# Patient Record
Sex: Male | Born: 1940 | Race: Black or African American | Hispanic: No | Marital: Married | Smoking: Never smoker
Health system: Southern US, Community
[De-identification: ages and names within clinical notes are randomized; demographics above are authoritative.]

## PROBLEM LIST (undated history)

## (undated) DIAGNOSIS — I10 Essential (primary) hypertension: Secondary | ICD-10-CM

## (undated) DIAGNOSIS — Z9989 Dependence on other enabling machines and devices: Secondary | ICD-10-CM

## (undated) DIAGNOSIS — I4891 Unspecified atrial fibrillation: Secondary | ICD-10-CM

## (undated) DIAGNOSIS — I739 Peripheral vascular disease, unspecified: Secondary | ICD-10-CM

## (undated) DIAGNOSIS — E119 Type 2 diabetes mellitus without complications: Secondary | ICD-10-CM

## (undated) DIAGNOSIS — M1990 Unspecified osteoarthritis, unspecified site: Secondary | ICD-10-CM

## (undated) DIAGNOSIS — J189 Pneumonia, unspecified organism: Secondary | ICD-10-CM

## (undated) DIAGNOSIS — N183 Chronic kidney disease, stage 3 unspecified: Secondary | ICD-10-CM

## (undated) DIAGNOSIS — E7800 Pure hypercholesterolemia, unspecified: Secondary | ICD-10-CM

## (undated) DIAGNOSIS — G4733 Obstructive sleep apnea (adult) (pediatric): Secondary | ICD-10-CM

## (undated) HISTORY — DX: Peripheral vascular disease, unspecified: I73.9

## (undated) HISTORY — PX: CARDIAC CATHETERIZATION: SHX172

## (undated) HISTORY — DX: Essential (primary) hypertension: I10

---

## 1990-06-30 HISTORY — PX: CORONARY ARTERY BYPASS GRAFT: SHX141

## 1990-07-14 DIAGNOSIS — Z951 Presence of aortocoronary bypass graft: Secondary | ICD-10-CM

## 2006-06-30 DIAGNOSIS — I739 Peripheral vascular disease, unspecified: Secondary | ICD-10-CM

## 2006-06-30 HISTORY — PX: CORONARY ANGIOPLASTY WITH STENT PLACEMENT: SHX49

## 2006-06-30 HISTORY — DX: Peripheral vascular disease, unspecified: I73.9

## 2009-07-18 ENCOUNTER — Encounter: Payer: Self-pay | Admitting: Endocrinology

## 2009-08-16 ENCOUNTER — Ambulatory Visit: Payer: Self-pay | Admitting: Endocrinology

## 2009-08-16 DIAGNOSIS — E7800 Pure hypercholesterolemia, unspecified: Secondary | ICD-10-CM | POA: Insufficient documentation

## 2009-08-16 DIAGNOSIS — I10 Essential (primary) hypertension: Secondary | ICD-10-CM | POA: Insufficient documentation

## 2009-08-16 DIAGNOSIS — E119 Type 2 diabetes mellitus without complications: Secondary | ICD-10-CM | POA: Insufficient documentation

## 2009-08-16 DIAGNOSIS — I2510 Atherosclerotic heart disease of native coronary artery without angina pectoris: Secondary | ICD-10-CM | POA: Insufficient documentation

## 2009-09-20 ENCOUNTER — Ambulatory Visit: Payer: Self-pay | Admitting: Endocrinology

## 2009-09-26 ENCOUNTER — Ambulatory Visit: Payer: Self-pay | Admitting: Endocrinology

## 2010-07-30 NOTE — Assessment & Plan Note (Signed)
Summary: follow up-lb   Vital Signs:  Patient profile:   69 year old male Height:      67 inches (170.18 cm) Weight:      186.25 pounds (84.66 kg) O2 Sat:      97 % on Room air Temp:     97.1 degrees F (36.17 degrees C) oral Pulse rate:   58 / minute BP sitting:   154 / 62  (left arm) Cuff size:   large  Vitals Entered By: Josph Macho RMA (September 26, 2009 9:31 AM)  O2 Flow:  Room air CC: Follow-up visit/ CF Is Patient Diabetic? Yes   Referring Provider:  Chrissie Noa prince Primary Provider:  Lajoyce Lauber  CC:  Follow-up visit/ CF.  History of Present Illness: pt did not take the actos, out of fear of the side-effects.  pt states he feels well in general.  he says cbg's are mid-100's  Current Medications (verified): 1)  Glimepiride 4 Mg Tabs (Glimepiride) .Marland Kitchen.. 1 Tablet Two Times A Day 2)  Folic Acid 1 Mg Tabs (Folic Acid) .Marland Kitchen.. 1 Tablet Daily 3)  Trico .... 1 Tab Daily 4)  Metoprolol Tartrate 50 Mg Tabs (Metoprolol Tartrate) .Marland Kitchen.. 1 Tablet Two Times A Day 5)  Lipitor 20 Mg Tabs (Atorvastatin Calcium) .Marland Kitchen.. 1 Tablet Daily 6)  Metformin Hcl 500 Mg Tabs (Metformin Hcl) .Marland Kitchen.. 1 Tablet Daily 7)  Multivitamin .... Once Daily 8)  Aspirin 325 Mg Tabs (Aspirin) .... Once Daily 9)  Omega 3 .... 1000mg   Daily 10)  Actos 45 Mg Tabs (Pioglitazone Hcl) .Marland Kitchen.. 1 Qd  Allergies (verified): No Known Drug Allergies  Past History:  Past Medical History: Last updated: 08/16/2009 HYPERTENSION (ICD-401.9) HYPERCHOLESTEROLEMIA (ICD-272.0) DM (ICD-250.00) CAD (ICD-414.00)  Review of Systems  The patient denies hypoglycemia.    Physical Exam  General:  normal appearance.   Extremities:  no edema   Impression & Recommendations:  Problem # 1:  DM (ICD-250.00) needs increased rx  Medications Added to Medication List This Visit: 1)  Metformin Hcl 500 Mg Xr24h-tab (Metformin hcl) .Marland Kitchen.. 1 two times a day 2)  Bromocriptine Mesylate 2.5 Mg Tabs (Bromocriptine mesylate) .... 1/2 at  bedtime 3)  Glimepiride 2 Mg Tabs (Glimepiride) .Marland Kitchen.. 1 two times a day  Other Orders: Est. Patient Level III (16109)  Patient Instructions: 1)  please let me know if you decide to take the actos (it can reduce the need for the tricor). 2)  increase metformin to 500 mg two times a day 3)  add bromocriptine 1/2 of 2.5 mg each night 4)  Please schedule a follow-up appointment in 1 month.

## 2010-07-30 NOTE — Assessment & Plan Note (Signed)
Summary: NEW ENDO / DIABETES / [facility] / #/ CD   Vital Signs:  Patient profile:   70 year old male Height:      67 inches (170.18 cm) Weight:      184 pounds (83.64 kg) BMI:     28.92 O2 Sat:      98 % on Room air Temp:     96.4 degrees F (35.78 degrees C) oral Pulse rate:   50 / minute BP sitting:   158 / 78  (left arm) Cuff size:   large  Vitals Entered By: Josph Macho RMA (August 16, 2009 1:39 PM)  O2 Flow:  Room air CC: New Endo: Diabetes/ CF Is Patient Diabetic? Yes   Referring Provider:  Chrissie Noa prince Primary Provider:  Lajoyce Lauber  CC:  New Endo: Diabetes/ CF.  History of Present Illness: pt states 3 years h/o dm.  it is complicated by cad.  he has never been on insulin.  he takes amaryl and metformin.  no cbg record, but states cbg's are mid-100's. pt says his diet and exercise are "pretty good."   symptomatically, pt states few mos of moderate burning type pain in the legs, but no associated numbness.   Current Medications (verified): 1)  Glimepiride 4 Mg Tabs (Glimepiride) .Marland Kitchen.. 1 Tablet Two Times A Day 2)  Folic Acid 1 Mg Tabs (Folic Acid) .Marland Kitchen.. 1 Tablet Daily 3)  Trico .... 1 Tab Daily 4)  Metoprolol Tartrate 50 Mg Tabs (Metoprolol Tartrate) .Marland Kitchen.. 1 Tablet Two Times A Day 5)  Lipitor 20 Mg Tabs (Atorvastatin Calcium) .Marland Kitchen.. 1 Tablet Daily 6)  Metformin Hcl 500 Mg Tabs (Metformin Hcl) .Marland Kitchen.. 1 Tablet Daily 7)  Multivitamin .... Once Daily 8)  Aspirin 325 Mg Tabs (Aspirin) .... Once Daily 9)  Omega 3 .... 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  he reports dry mouth and polyuria.  he has intemittent night sweats.   Physical Exam  General:  normal appearance.   Head:  head: no deformity eyes: no periorbital swelling, no proptosis external nose and ears are normal mouth: no lesion seen Neck:  Supple without thyroid enlargement or tenderness.   Chest Wall:  there is a median sternotomy scar Lungs:  Clear to auscultation bilaterally. Normal respiratory effort.  Heart:  Regular rate and rhythm without murmurs or gallops noted. Normal S1,S2.   Msk:  muscle bulk and strength are grossly normal.  no obvious joint swelling.  gait is normal and steady  Pulses:  dorsalis pedis intact bilat.  no carotid bruit  Extremities:  no deformity.  no ulcer on the feet.  feet are of normal color and temp.  no edema there are bilateral vein harvest scars Neurologic:  cn 2-12 grossly intact.   readily moves all 4's.   sensation is intact to touch on the feet  Skin:  normal texture and temp.  no rash.  not diaphoretic  Cervical Nodes:  No significant adenopathy.  Psych:  Alert and cooperative; normal mood and affect; normal attention span and concentration.   Additional Exam:  test results are reviewed:  a1c=7.8  Impression & Recommendations:  Problem # 1:  DM (ICD-250.00) needs increased rx  Problem # 2:  CAD (ICD-414.00) so she needs to avoid hypoglycemia  Problem # 3:  leg pain prob neuropathic  Medications Added to Medication List This Visit: 1)  Glimepiride 4 Mg Tabs (Glimepiride) .Marland Kitchen.. 1 tablet two times a day 2)  Folic Acid 1 Mg Tabs (Folic acid) .Marland Kitchen.. 1 tablet daily 3)  Trico  .... 1 tab daily 4)  Metoprolol Tartrate 50 Mg Tabs (Metoprolol tartrate) .Marland Kitchen.. 1 tablet two times a day 5)  Lipitor 20 Mg Tabs (Atorvastatin calcium) .Marland Kitchen.. 1 tablet daily 6)  Metformin Hcl 500 Mg Tabs (Metformin hcl) .Marland Kitchen.. 1 tablet daily 7)  Multivitamin  .... Once daily 8)  Aspirin 325 Mg Tabs (Aspirin)  .... Once daily 9)  Omega 3  .... 1000mg   daily 10)  Actos 45 Mg Tabs (Pioglitazone hcl) .Marland Kitchen.. 1 qd  Other Orders: Prescription Created Electronically [id number]) Consultation Level IV [phone])  Patient Instructions: 1)  we discussed the importance of diet and exercise therapy and the risks of diabetes.  you should see an eye doctor every year. 2)  i told pt we will need to take this complex situation in stages 3)  it is very important to keep good control of blood pressure and cholesterol, especially in those with diabetes.  stopping smoking also reduces the damage diabetes does to your body.  please discuss these with your doctor.  you should take an aspirin every day, unless you have been advised by a doctor not to. 4)  check your blood sugar 1 time a day.  vary the time of day when you check, between before the 3 meals, and at bedtime.  also check if you have symptoms of your blood sugar being too high or too low.  please keep a record of the readings and bring it to your next appointment here.  please call us sooner if you are having low blood sugar episodes. 5)  add actos 45 mg once daily.  please note that this is a very slow-acting medication. 6)  as the actos works, you will notice your blood sugar decreasing.  as it does, reduce your glimepiride.  if i need to call a smaller pill size (1 or 2 mg) to your pharmacy, call me.  7)  also call sooner if you notice swelling of the legs 8)  Please schedule a follow-up appointment in 1 month. Prescriptions: ACTOS 45 MG TABS (PIOGLITAZONE HCL) 1 qd  #90 x 3   Entered and Authorized by:   Minus Breeding MD   Signed by:   Minus Breeding MD on 08/16/2009   Method used:   Electronically to        [facility]* (mail-order)             ,          Ph: [phone]       Fax: [phone]   RxID:   [id number]   Preventive Care Screening  Last Flu Shot:    Date:  03/30/2009    Results:  historical

## 2010-12-02 ENCOUNTER — Other Ambulatory Visit: Payer: Self-pay | Admitting: Endocrinology

## 2014-11-15 ENCOUNTER — Telehealth: Payer: Self-pay | Admitting: Hematology

## 2014-11-15 NOTE — Telephone Encounter (Signed)
new patient referral-called patient to give appt per patient call at funeral. Will call back later on today.

## 2014-11-15 NOTE — Telephone Encounter (Signed)
new patient referral-left message for patient to return call

## 2014-11-16 ENCOUNTER — Telehealth: Payer: Self-pay | Admitting: Hematology

## 2014-11-16 NOTE — Telephone Encounter (Signed)
new patient appt-s/w patient and gve np appt for 06/07 @ 10:30 w/Dr. Burr Medico Referring Dr. Elmarie Shiley Dx-Abnl SPEP; Abnl light chain

## 2014-12-05 ENCOUNTER — Ambulatory Visit (HOSPITAL_BASED_OUTPATIENT_CLINIC_OR_DEPARTMENT_OTHER): Payer: Medicare Other | Admitting: Hematology

## 2014-12-05 ENCOUNTER — Ambulatory Visit: Payer: Medicare Other

## 2014-12-05 ENCOUNTER — Encounter (INDEPENDENT_AMBULATORY_CARE_PROVIDER_SITE_OTHER): Payer: Self-pay

## 2014-12-05 ENCOUNTER — Telehealth: Payer: Self-pay | Admitting: Hematology

## 2014-12-05 ENCOUNTER — Encounter: Payer: Self-pay | Admitting: Hematology

## 2014-12-05 ENCOUNTER — Other Ambulatory Visit (HOSPITAL_BASED_OUTPATIENT_CLINIC_OR_DEPARTMENT_OTHER): Payer: Medicare Other

## 2014-12-05 VITALS — BP 149/66 | HR 45 | Temp 97.7°F | Resp 18 | Ht 67.0 in | Wt 182.5 lb

## 2014-12-05 DIAGNOSIS — N189 Chronic kidney disease, unspecified: Secondary | ICD-10-CM | POA: Diagnosis not present

## 2014-12-05 DIAGNOSIS — D472 Monoclonal gammopathy: Secondary | ICD-10-CM

## 2014-12-05 DIAGNOSIS — E119 Type 2 diabetes mellitus without complications: Secondary | ICD-10-CM

## 2014-12-05 LAB — CBC & DIFF AND RETIC
BASO%: 0.3 % (ref 0.0–2.0)
Basophils Absolute: 0 10*3/uL (ref 0.0–0.1)
EOS ABS: 0.1 10*3/uL (ref 0.0–0.5)
EOS%: 1.3 % (ref 0.0–7.0)
HCT: 42.7 % (ref 38.4–49.9)
HGB: 14.7 g/dL (ref 13.0–17.1)
IMMATURE RETIC FRACT: 7.1 % (ref 3.00–10.60)
LYMPH%: 32.9 % (ref 14.0–49.0)
MCH: 31.2 pg (ref 27.2–33.4)
MCHC: 34.4 g/dL (ref 32.0–36.0)
MCV: 90.7 fL (ref 79.3–98.0)
MONO#: 0.6 10*3/uL (ref 0.1–0.9)
MONO%: 8 % (ref 0.0–14.0)
NEUT%: 57.5 % (ref 39.0–75.0)
NEUTROS ABS: 4.1 10*3/uL (ref 1.5–6.5)
Platelets: 165 10*3/uL (ref 140–400)
RBC: 4.71 10*6/uL (ref 4.20–5.82)
RDW: 13.4 % (ref 11.0–14.6)
RETIC %: 1.32 % (ref 0.80–1.80)
Retic Ct Abs: 62.17 10*3/uL (ref 34.80–93.90)
WBC: 7.1 10*3/uL (ref 4.0–10.3)
lymph#: 2.3 10*3/uL (ref 0.9–3.3)

## 2014-12-05 LAB — COMPREHENSIVE METABOLIC PANEL (CC13)
ALT: 36 U/L (ref 0–55)
AST: 26 U/L (ref 5–34)
Albumin: 3.8 g/dL (ref 3.5–5.0)
Alkaline Phosphatase: 64 U/L (ref 40–150)
Anion Gap: 7 mEq/L (ref 3–11)
BILIRUBIN TOTAL: 0.62 mg/dL (ref 0.20–1.20)
BUN: 23 mg/dL (ref 7.0–26.0)
CALCIUM: 9.4 mg/dL (ref 8.4–10.4)
CO2: 24 mEq/L (ref 22–29)
CREATININE: 1.2 mg/dL (ref 0.7–1.3)
Chloride: 106 mEq/L (ref 98–109)
EGFR: 68 mL/min/{1.73_m2} — AB (ref >90)
Glucose: 162 mg/dl — ABNORMAL HIGH (ref 70–140)
Potassium: 4 mEq/L (ref 3.5–5.1)
Sodium: 137 mEq/L (ref 136–145)
TOTAL PROTEIN: 7.6 g/dL (ref 6.4–8.3)

## 2014-12-05 LAB — LACTATE DEHYDROGENASE (CC13): LDH: 219 U/L (ref 125–245)

## 2014-12-05 NOTE — Progress Notes (Signed)
Checked in new pt with no financial concerns prior to seeing the dr.  Pt has 2 insurances so financial assistance may not be needed but he has my card for any questions or concerns.  ° °

## 2014-12-05 NOTE — Telephone Encounter (Signed)
Gave and printed appt sched and avs for pt for JUNE °

## 2014-12-05 NOTE — Progress Notes (Signed)
[facility]  [phone] Fax:([phone]) [place] Note   Patient Care Team: Bonita Quin, MD as PCP - General (Internal Medicine) Royann Shivers, MD as Referring Physician (Nephrology) Truitt Merle, MD as Consulting Physician (Hematology) Foye Spurling, MD as Consulting Physician (Internal Medicine) 12/05/2014  CHIEF COMPLAINTS/PURPOSE OF CONSULTATION:  Abnormal SPEP   Referring physician:  Dr. Elmarie Shiley  HISTORY OF PRESENTING ILLNESS:  Dustin [name] 74 y.o. male is here because of abnormal SPEP.  He has long-standing history of diabetes, and chronic kidney disease. He was recently referred to a nephrologist Dr. Posey Pronto on 11/01/2014. As part of her workup, SPEP and light chain level was checked. 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 Serum free coprolite chain was elevated at 39, lambda light chain was normal, ratio was 2.62.  He feels well, no pain, except sometime left hip pain when he walks, he uses a crane, no other pain. He has good appetite and energy, he is not very active due to the left pauiin.   MEDICAL HISTORY:  Past Medical History  Diagnosis Date  . Hypertension   . Chronic kidney disease   . Diabetes mellitus without complication   . PVD (peripheral vascular disease) 2008    stent placement in legs     SURGICAL HISTORY: Past Surgical History  Procedure Laterality Date  . Coronary artery bypass graft  1992  . Coronary angioplasty with stent placement  2008    SOCIAL HISTORY: History   Social History  . Marital Status: Married    Spouse Name: N/A  . Number of Children: 3  . Years of Education: N/A   Occupational History  . Worked for [employer]   Social History Main Topics  . Smoking status: Never Smoker   . Smokeless tobacco: Not on file  . Alcohol Use: No  . Drug Use: No  . Sexual Activity: Not on file   Other Topics Concern  . Not on file   Social History  Narrative  . No narrative on file    FAMILY HISTORY: Family History  Problem Relation Age of Onset  . Heart failure Mother   . Heart failure Father   . Diabetes Sister   . Congenital heart disease Sister   . Cancer Brother 8    prostate and colon cancer     ALLERGIES:  has No Known Allergies.  MEDICATIONS:  Current Outpatient Prescriptions  Medication Sig Dispense Refill  . atorvastatin (LIPITOR) 20 MG tablet Take 20 mg by mouth every evening.    . canagliflozin (INVOKANA) 300 MG TABS tablet Take 300 mg by mouth daily before breakfast.    . cholecalciferol (VITAMIN D) 400 UNITS TABS tablet Take 5,000 Units by mouth daily.    Marland Kitchen co-enzyme Q-10 30 MG capsule Take 100 mg by mouth daily.    Marland Kitchen glimepiride (AMARYL) 2 MG tablet Take 2 mg by mouth 2 (two) times daily.    . Levomefolic Acid 1 MG CAPS Take 5,000 mcg by mouth daily.    Marland Kitchen linagliptin (TRADJENTA) 5 MG TABS tablet Take 5 mg by mouth daily.    Marland Kitchen lisinopril (PRINIVIL,ZESTRIL) 20 MG tablet Take 20 mg by mouth 2 (two) times daily.    .   magnesium 30 MG tablet Take 30 mg by mouth 2 (two) times daily.    . metoprolol (LOPRESSOR) 50 MG tablet Take 50 mg by mouth 2 (two) times daily.    Marland Kitchen NON  FORMULARY Take 12.5 mg by mouth daily. Iodine complex, take 1 tab daily by mouth    . rivaroxaban (XARELTO) 20 MG TABS tablet Take 20 mg by mouth daily with supper.    . saw palmetto 160 MG capsule Take 160 mg by mouth 2 (two) times daily.    . tamsulosin (FLOMAX) 0.4 MG CAPS capsule Take 0.4 mg by mouth daily. 2 capsules once daily    . timolol (TIMOPTIC) 0.5 % ophthalmic solution Place 1 drop into both eyes daily.    . Travoprost, BAK Free, (TRAVATAN) 0.004 % SOLN ophthalmic solution Place 1 drop into both eyes daily.     No current facility-administered medications for this visit.    REVIEW OF SYSTEMS:   Constitutional: Denies fevers, chills or abnormal night sweats Eyes: Denies blurriness of vision, double vision or watery eyes Ears,  nose, mouth, throat, and face: Denies mucositis or sore throat Respiratory: Denies cough, dyspnea or wheezes Cardiovascular: Denies palpitation, chest discomfort or lower extremity swelling Gastrointestinal:  Denies nausea, heartburn or change in bowel habits Skin: Denies abnormal skin rashes Lymphatics: Denies new lymphadenopathy or easy bruising Neurological:Denies numbness, tingling or new weaknesses Behavioral/Psych: Mood is stable, no new changes  All other systems were reviewed with the patient and are negative.  PHYSICAL EXAMINATION: ECOG PERFORMANCE STATUS: 0 - Asymptomatic  Filed Vitals:   12/05/14 1029  BP: 149/66  Pulse: 45  Temp: 97.7 F (36.5 C)  Resp: 18   Filed Weights   12/05/14 1029  Weight: 182 lb 8 oz (82.781 kg)    GENERAL:alert, no distress and comfortable SKIN: skin color, texture, turgor are normal, no rashes or significant lesions EYES: normal, conjunctiva are pink and non-injected, sclera clear OROPHARYNX:no exudate, no erythema and lips, buccal mucosa, and tongue normal  NECK: supple, thyroid normal size, non-tender, without nodularity LYMPH:  no palpable lymphadenopathy in the cervical, axillary or inguinal LUNGS: clear to auscultation and percussion with normal breathing effort HEART: regular rate & rhythm and no murmurs and no lower extremity edema ABDOMEN:abdomen soft, non-tender and normal bowel sounds Musculoskeletal:no cyanosis of digits and no clubbing  PSYCH: alert & oriented x 3 with fluent speech NEURO: no focal motor/sensory deficits  LABORATORY DATA:  I have reviewed the data as listed No results found for: WBC, HGB, HCT, MCV, PLT No results for input(s): NA, K, CL, CO2, GLUCOSE, BUN, CREATININE, CALCIUM, GFRNONAA, GFRAA, PROT, ALBUMIN, AST, ALT, ALKPHOS, BILITOT, BILIDIR, IBILI in the last 8760 hours.  RADIOGRAPHIC STUDIES: I have personally reviewed the radiological images as listed and agreed with the findings in the report. No  results found.  ASSESSMENT & PLAN:  74 year old male with past medical history of diabetes and chronic kidney disease  1. 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 He is asymptomatic except chronic left hip pain from arthritis. -I reviewed his outside lab. SPEP showed monoclonal IgG gammopathy, with low M protein level (0.5g/dl), serum free Kappa light chain is slightly elevated, light chain ratio was normal. -His outside CBC was normal, CMP showed a normal calcium level, creatinine 1.44. He does have long-standing history of diabetes, which can cause chronic kidney disease. -I'll like to obtain a bone survey and a bone marrow biopsy to ruled out multiple myeloma. He agrees. Burnis Medin obtain his 24-hour urine UPEP with immunofixation and light chain level, will also check LDH and beta-2 microglobulin. -I'll see him back in 3 weeks to discuss the above test results.  2. DM, CKD -He  will continue follow-up with his primary care physician and nephrologist.  All questions were answered. The patient knows to call the clinic with any problems, questions or concerns. I spent 40 minutes counseling the patient face to face. The total time spent in the appointment was 50 minutes and more than 50% was on counseling.     Truitt Merle, MD 12/05/2014 11:39 AM

## 2014-12-07 LAB — SPEP & IFE WITH QIG
ALPHA-1-GLOBULIN: 0.2 g/dL (ref 0.2–0.3)
ALPHA-2-GLOBULIN: 0.6 g/dL (ref 0.5–0.9)
Abnormal Protein Band1: 0.6 g/dL
Albumin ELP: 4.1 g/dL (ref 3.8–4.8)
Beta 2: 0.4 g/dL (ref 0.2–0.5)
Beta Globulin: 0.5 g/dL (ref 0.4–0.6)
GAMMA GLOBULIN: 1.6 g/dL (ref 0.8–1.7)
IGA: 302 mg/dL (ref 68–379)
IGG (IMMUNOGLOBIN G), SERUM: 1880 mg/dL — AB (ref 650–1600)
IgM, Serum: 46 mg/dL (ref 41–251)
Total Protein, Serum Electrophoresis: 7.3 g/dL (ref 6.1–8.1)

## 2014-12-07 LAB — KAPPA/LAMBDA LIGHT CHAINS
Kappa free light chain: 2.76 mg/dL — ABNORMAL HIGH (ref 0.33–1.94)
Kappa:Lambda Ratio: 1.77 — ABNORMAL HIGH (ref 0.26–1.65)
LAMBDA FREE LGHT CHN: 1.56 mg/dL (ref 0.57–2.63)

## 2014-12-07 LAB — BETA 2 MICROGLOBULIN, SERUM: Beta-2 Microglobulin: 1.78 mg/L (ref <=2.51)

## 2014-12-09 ENCOUNTER — Encounter: Payer: Self-pay | Admitting: Hematology

## 2014-12-12 ENCOUNTER — Ambulatory Visit (HOSPITAL_COMMUNITY)
Admission: RE | Admit: 2014-12-12 | Discharge: 2014-12-12 | Disposition: A | Discharge status: Home / Self Care | Payer: Medicare Other | Source: Ambulatory Visit | Attending: Hematology | Admitting: Hematology

## 2014-12-12 DIAGNOSIS — R930 Abnormal findings on diagnostic imaging of skull and head, not elsewhere classified: Secondary | ICD-10-CM | POA: Diagnosis not present

## 2014-12-12 DIAGNOSIS — D472 Monoclonal gammopathy: Secondary | ICD-10-CM | POA: Diagnosis not present

## 2014-12-12 DIAGNOSIS — Z951 Presence of aortocoronary bypass graft: Secondary | ICD-10-CM | POA: Insufficient documentation

## 2014-12-12 DIAGNOSIS — M479 Spondylosis, unspecified: Secondary | ICD-10-CM | POA: Insufficient documentation

## 2014-12-12 LAB — UPEP/TP, 24-HR URINE
Collection Interval: 24 hours
Total Volume, Urine: 2000 mL

## 2014-12-12 LAB — 24 HR URINE,KAPPA/LAMBDA LIGHT CHAINS
Measured Kappa Chain: <0.4 mg/dL (ref <2.00)
Measured Lambda Chain: <0.4 mg/dL (ref <2.00)
URINE VOLUME: 2000 mL/(24.h)

## 2014-12-12 LAB — UIFE/LIGHT CHAINS/TP QN, 24-HR UR
Albumin, U: DETECTED
Alpha 1, Urine: DETECTED — AB
Alpha 2, Urine: DETECTED — AB
Beta, Urine: DETECTED — AB
GAMMA UR: DETECTED — AB
TIME-UPE24: 24 h
Volume, Urine: 2000 mL

## 2014-12-18 ENCOUNTER — Other Ambulatory Visit: Payer: Self-pay | Admitting: Radiology

## 2014-12-19 ENCOUNTER — Ambulatory Visit (HOSPITAL_COMMUNITY)
Admission: RE | Admit: 2014-12-19 | Discharge: 2014-12-19 | Disposition: A | Discharge status: Home / Self Care | Payer: Medicare Other | Source: Ambulatory Visit | Attending: Hematology | Admitting: Hematology

## 2014-12-19 ENCOUNTER — Encounter (HOSPITAL_COMMUNITY): Payer: Self-pay

## 2014-12-19 DIAGNOSIS — E119 Type 2 diabetes mellitus without complications: Secondary | ICD-10-CM | POA: Diagnosis not present

## 2014-12-19 DIAGNOSIS — D472 Monoclonal gammopathy: Secondary | ICD-10-CM | POA: Insufficient documentation

## 2014-12-19 LAB — CBC
HEMATOCRIT: 44.2 % (ref 39.0–52.0)
HEMOGLOBIN: 14.9 g/dL (ref 13.0–17.0)
MCH: 30.9 pg (ref 26.0–34.0)
MCHC: 33.7 g/dL (ref 30.0–36.0)
MCV: 91.7 fL (ref 78.0–100.0)
Platelets: 182 10*3/uL (ref 150–400)
RBC: 4.82 MIL/uL (ref 4.22–5.81)
RDW: 13.3 % (ref 11.5–15.5)
WBC: 7.4 10*3/uL (ref 4.0–10.5)

## 2014-12-19 LAB — PROTIME-INR
INR: 1.09 (ref 0.00–1.49)
Prothrombin Time: 14.3 seconds (ref 11.6–15.2)

## 2014-12-19 LAB — GLUCOSE, CAPILLARY: Glucose-Capillary: 138 mg/dL — ABNORMAL HIGH (ref 65–99)

## 2014-12-19 LAB — BONE MARROW EXAM

## 2014-12-19 LAB — APTT: APTT: 23 s — AB (ref 24–37)

## 2014-12-19 MED ORDER — FENTANYL CITRATE (PF) 100 MCG/2ML IJ SOLN
INTRAMUSCULAR | Status: AC | PRN
  Administered 2014-12-19: 25 ug via INTRAVENOUS

## 2014-12-19 MED ORDER — MIDAZOLAM HCL 2 MG/2ML IJ SOLN
INTRAMUSCULAR | Status: AC
  Filled 2014-12-19: qty 4

## 2014-12-19 MED ORDER — MIDAZOLAM HCL 2 MG/2ML IJ SOLN
INTRAMUSCULAR | Status: AC | PRN
  Administered 2014-12-19: 1 mg via INTRAVENOUS

## 2014-12-19 MED ORDER — FENTANYL CITRATE (PF) 100 MCG/2ML IJ SOLN
INTRAMUSCULAR | Status: AC
  Filled 2014-12-19: qty 2

## 2014-12-19 MED ORDER — SODIUM CHLORIDE 0.9 % IV SOLN
Freq: Once | INTRAVENOUS | Status: AC
  Administered 2014-12-19: 10:00:00 via INTRAVENOUS

## 2014-12-19 NOTE — H&P (Signed)
Chief Complaint: "I'm getting a bone marrow biopsy"  Referring Physician(s): Feng,Yan  History of Present Illness: Dustin [name] is a 74 y.o. male with history of HTN, PVD, diabetes, hyperthyroidism, CAD with prior stenting, chronic kidney disease, atrial fibrillation (on xarelto) and  IgG MGUS who presents today for CT-guided bone marrow biopsy for further evaluation.  Past Medical History  Diagnosis Date  . Hypertension   . Chronic kidney disease   . Diabetes mellitus without complication   . PVD (peripheral vascular disease) 2008    stent placement in legs     Past Surgical History  Procedure Laterality Date  . Coronary artery bypass graft  1992  . Coronary angioplasty with stent placement  2008    Allergies: Review of patient's allergies indicates no known allergies.  Medications: Prior to Admission medications   Medication Sig Start Date End Date Taking? Authorizing Provider  atorvastatin (LIPITOR) 40 MG tablet Take 20 mg by mouth daily.   Yes Historical Provider, MD  Calcium Citrate 150 MG CAPS Take 300 mg by mouth daily.   Yes Historical Provider, MD  canagliflozin (INVOKANA) 300 MG TABS tablet Take 300 mg by mouth daily.    Yes Historical Provider, MD  Cholecalciferol (VITAMIN D3) 5000 UNITS CAPS Take 5,000 Units by mouth daily.   Yes Historical Provider, MD  Cyanocobalamin (B-12) 5000 MCG CAPS Take 1 capsule by mouth daily.   Yes Historical Provider, MD  glimepiride (AMARYL) 2 MG tablet Take 4 mg by mouth 2 (two) times daily.    Yes Historical Provider, MD  linagliptin (TRADJENTA) 5 MG TABS tablet Take 5 mg by mouth daily.   Yes Historical Provider, MD  lisinopril (PRINIVIL,ZESTRIL) 20 MG tablet Take 20 mg by mouth 2 (two) times daily.   Yes Historical Provider, MD  Magnesium 250 MG TABS Take 2 tablets by mouth daily.   Yes Historical Provider, MD  metoprolol (LOPRESSOR) 50 MG tablet Take 50 mg by mouth 2 (two) times daily.   Yes Historical Provider, MD  NON  FORMULARY Take 12.5 mg by mouth daily. Iodine complex   Yes Historical Provider, MD  NON FORMULARY Take 5 mg by mouth daily. dehydroepiandrosterone   Yes Historical Provider, MD  NON FORMULARY Take 5,000 mcg by mouth daily. Folate   Yes Historical Provider, MD  pyridOXINE (VITAMIN B-6) 50 MG tablet Take 50 mg by mouth daily.   Yes Historical Provider, MD  rivaroxaban (XARELTO) 20 MG TABS tablet Take 20 mg by mouth daily with supper.   Yes Historical Provider, MD  Saw Palmetto, Serenoa repens, 320 MG CAPS Take 1 capsule by mouth daily.   Yes Historical Provider, MD  tamsulosin (FLOMAX) 0.4 MG CAPS capsule Take 0.8 mg by mouth at bedtime.    Yes Historical Provider, MD  timolol (TIMOPTIC) 0.5 % ophthalmic solution Place 1 drop into both eyes daily.   Yes Historical Provider, MD  Travoprost, BAK Free, (TRAVATAN) 0.004 % SOLN ophthalmic solution Place 1 drop into both eyes daily.   Yes Historical Provider, MD  Vitamins A & D 5000-400 UNITS CAPS Take 1 capsule by mouth daily.   Yes Historical Provider, MD  co-enzyme Q-10 30 MG capsule Take 100 mg by mouth daily.    Historical Provider, MD  nitroGLYCERIN (NITROSTAT) 0.4 MG SL tablet Place 0.4 mg under the tongue every 5 (five) minutes as needed for chest pain.  03/24/14   Historical Provider, MD    Family History  Problem Relation Age of Onset  .  Heart failure Mother   . Heart failure Father   . Diabetes Sister   . Congenital heart disease Sister   . Cancer Brother 84    prostate and colon cancer     History   Social History  . Marital Status: Married    Spouse Name: N/A  . Number of Children: N/A  . Years of Education: N/A   Social History Main Topics  . Smoking status: Never Smoker   . Smokeless tobacco: Not on file  . Alcohol Use: No  . Drug Use: No  . Sexual Activity: Not on file   Other Topics Concern  . Not on file   Social History Narrative      Review of Systems  Constitutional: Negative for fever and chills.    Respiratory: Negative for shortness of breath.        Occ cough  Cardiovascular: Negative for chest pain.  Gastrointestinal: Negative for nausea, abdominal pain and blood in stool.  Genitourinary: Negative for dysuria and hematuria.  Musculoskeletal: Positive for back pain.       Bilateral hip pain, greater on left  Neurological: Negative for headaches.    Vital Signs: BP 140/67 mmHg  Pulse 85  Temp(Src) 98 F (36.7 C) (Oral)  Resp 18  SpO2 98%  Physical Exam  Constitutional: He is oriented to person, place, and time. He appears well-developed and well-nourished.  Cardiovascular:  Irregularly irregular  Pulmonary/Chest: Effort normal and breath sounds normal.  Abdominal: Soft. Bowel sounds are normal. There is no tenderness.  Musculoskeletal: Normal range of motion. He exhibits no edema.  Neurological: He is alert and oriented to person, place, and time.    Imaging: Dg Bone Survey Met  12/12/2014   CLINICAL DATA:  Monoclonal gammopathy of uncertain significance. Question multiple myeloma.  EXAM: METASTATIC BONE SURVEY  COMPARISON:  None.  FINDINGS: A single lucent lesion on the lateral view of the skull measuring 0.7 cm is identified. 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 No other lytic lesion is seen. The patient has severe degenerative disease about the hips. 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 The patient is status post CABG. The lungs are clear. Scattered mild appearing spondylosis is seen about the spine. Bilateral iliac stents are in place.  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 No convincing evidence of multiple myeloma is identified.  Severe degenerative disease about the hips.   Electronically Signed   By: Inge Rise M.D.   On: 12/12/2014 13:57     Labs:  CBC:  Recent Labs  12/05/14 1248  WBC 7.1  HGB 14.7  HCT 42.7  PLT 165    COAGS: No results for input(s): INR, APTT in the last 8760 hours.  BMP:  Recent Labs  12/05/14 1248  NA 137  K 4.0  CO2 24  GLUCOSE 162*  BUN 23.0  CALCIUM 9.4  CREATININE 1.2    LIVER FUNCTION TESTS:  Recent Labs  12/05/14 1248  BILITOT 0.62  AST 26  ALT 36  ALKPHOS 64  PROT 7.6  ALBUMIN 3.8    TUMOR MARKERS: No results for input(s): AFPTM, CEA, CA199, CHROMGRNA in the last 8760 hours.  Assessment and Plan: Dustin [name] is a 74 y.o. male with history of HTN, PVD, diabetes, hyperthyroidism, CAD with prior stenting, chronic kidney disease, atrial  fibrillation (on xarelto) and  IgG MGUS who presents today for CT-guided bone marrow biopsy for further evaluation.Risks and benefits discussed with the patient including, but not limited to bleeding, infection, damage to adjacent structures or low yield requiring additional tests. All of the patient's questions were answered, patient is agreeable to proceed. Consent signed and in chart.     Signed: D. Rowe Robert 12/19/2014, 9:26 AM   I spent a total of 20 minutes face to face in clinical consultation, greater than 50% of which was counseling/coordinating care for CT-guided bone marrow biopsy

## 2014-12-19 NOTE — Discharge Instructions (Signed)
Conscious Sedation °Sedation is the use of medicines to promote relaxation and relieve discomfort and anxiety. Conscious sedation is a type of sedation. Under conscious sedation you are less alert than normal but are still able to respond to instructions or stimulation. Conscious sedation is used during short medical and dental procedures. It is milder than deep sedation or general anesthesia and allows you to return to your regular activities sooner.  °LET YOUR HEALTH CARE PROVIDER KNOW ABOUT:  °· Any allergies you have. °· All medicines you are taking, including vitamins, herbs, eye drops, creams, and over-the-counter medicines. °· Use of steroids (by mouth or creams). °· Previous problems you or members of your family have had with the use of anesthetics. °· Any blood disorders you have. °· Previous surgeries you have had. °· Medical conditions you have. °· Possibility of pregnancy, if this applies. °· Use of cigarettes, alcohol, or illegal drugs. °RISKS AND COMPLICATIONS °Generally, this is a safe procedure. However, as with any procedure, problems can occur. Possible problems include: °· Oversedation. °· Trouble breathing on your own. You may need to have a breathing tube until you are awake and breathing on your own. °· Allergic reaction to any of the medicines used for the procedure. °BEFORE THE PROCEDURE °· You may have blood tests done. These tests can help show how well your kidneys and liver are working. They can also show how well your blood clots. °· A physical exam will be done.   °· Only take medicines as directed by your health care provider. You may need to stop taking medicines (such as blood thinners, aspirin, or nonsteroidal anti-inflammatory drugs) before the procedure.   °· Do not eat or drink at least 6 hours before the procedure or as directed by your health care provider. °· Arrange for a responsible adult, family member, or friend to take you home after the procedure. He or she should stay  with you for at least 24 hours after the procedure, until the medicine has worn off. °PROCEDURE  °· An intravenous (IV) catheter will be inserted into one of your veins. Medicine will be able to flow directly into your body through this catheter. You may be given medicine through this tube to help prevent pain and help you relax. °· The medical or dental procedure will be done. °AFTER THE PROCEDURE °· You will stay in a recovery area until the medicine has worn off. Your blood pressure and pulse will be checked.   °·  Depending on the procedure you had, you may be allowed to go home when you can tolerate liquids and your pain is under control. °Document Released: 03/11/2001 Document Revised: 06/21/2013 Document Reviewed: 02/21/2013 °ExitCare® Patient Information ©2015 [employer]. This information is not intended to replace advice given to you by your health care provider. Make sure you discuss any questions you have with your health care provider. ° °Bone Marrow Aspiration, Bone Marrow Biopsy °Care After °Read the instructions outlined below and refer to this sheet in the next few weeks. These discharge instructions provide you with general information on caring for yourself after you leave the hospital. Your caregiver may also give you specific instructions. While your treatment has been planned according to the most current medical practices available, unavoidable complications occasionally occur. If you have any problems or questions after discharge, call your caregiver. °FINDING OUT THE RESULTS OF YOUR TEST °Not all test results are available during your visit. If your test results are not back during the visit, make an appointment with   your caregiver to find out the results. Do not assume everything is normal if you have not heard from your caregiver or the medical facility. It is important for you to follow up on all of your test results.  °HOME CARE INSTRUCTIONS  °You have had sedation and may be sleepy or  dizzy. Your thinking may not be as clear as usual. For the next 24 hours: °· Only take over-the-counter or prescription medicines for pain, discomfort, and or fever as directed by your caregiver. °· Do not drink alcohol. °· Do not smoke. °· Do not drive. °· Do not make important legal decisions. °· Do not operate heavy machinery. °· Do not care for small children by yourself. °· Keep your dressing clean and dry. You may replace dressing with a bandage after 24 hours. °· You may take a bath or shower after 24 hours. °· Use an ice pack for 20 minutes every 2 hours while awake for pain as needed. °SEEK MEDICAL CARE IF:  °· There is redness, swelling, or increasing pain at the biopsy site. °· There is pus coming from the biopsy site. °· There is drainage from a biopsy site lasting longer than one day. °· An unexplained oral temperature above 102° F (38.9° C) develops. °SEEK IMMEDIATE MEDICAL CARE IF:  °· You develop a rash. °· You have difficulty breathing. °· You develop any reaction or side effects to medications given. °Document Released: 01/03/2005 Document Revised: 09/08/2011 Document Reviewed: 06/13/2008 °ExitCare® Patient Information ©2015 [employer]. This information is not intended to replace advice given to you by your health care provider. Make sure you discuss any questions you have with your health care provider. ° °

## 2014-12-19 NOTE — Procedures (Signed)
Interventional Radiology Procedure Note  Procedure: CT guided aspirate and core biopsy of right iliac bone Complications: None Recommendations: - Bedrest supine   - OTC for post op pain - Follow biopsy results  Signed,  Dulcy Fanny. Earleen Newport, DO

## 2014-12-25 ENCOUNTER — Telehealth: Payer: Self-pay | Admitting: *Deleted

## 2014-12-26 ENCOUNTER — Ambulatory Visit (HOSPITAL_BASED_OUTPATIENT_CLINIC_OR_DEPARTMENT_OTHER): Payer: Medicare Other | Admitting: Hematology

## 2014-12-26 ENCOUNTER — Telehealth: Payer: Self-pay | Admitting: Hematology

## 2014-12-26 ENCOUNTER — Other Ambulatory Visit: Payer: Medicare Other

## 2014-12-26 ENCOUNTER — Encounter: Payer: Self-pay | Admitting: Hematology

## 2014-12-26 VITALS — BP 123/65 | HR 64 | Temp 98.4°F | Resp 18 | Ht 67.0 in | Wt 184.2 lb

## 2014-12-26 DIAGNOSIS — D472 Monoclonal gammopathy: Secondary | ICD-10-CM | POA: Diagnosis present

## 2014-12-26 NOTE — Telephone Encounter (Signed)
Pt confirmed labs/ov per 06/28 POF, gave pt AVS and Calendar... KJ

## 2014-12-26 NOTE — Progress Notes (Signed)
[place]  [phone] [phone] [place] Note   Patient Care Team: Bonita Quin, MD as PCP - General (Internal Medicine) Truitt Merle, MD as Consulting Physician (Hematology) Foye Spurling, MD as Consulting Physician (Internal Medicine) Elmarie Shiley, MD as Consulting Physician (Nephrology) 12/26/2014  CHIEF COMPLAINTS:  Follow up IgG MGUS  Referring physician:  Dr. Elmarie Shiley  HISTORY OF PRESENTING ILLNESS:  Dustin [name] 74 y.o. male is here because of abnormal SPEP.  He has long-standing history of diabetes, and chronic kidney disease. He was recently referred to a nephrologist Dr. Posey Pronto on 11/01/2014. As part of her workup, SPEP and light chain level was checked. 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 Serum free coprolite chain was elevated at 39, lambda light chain was normal, ratio was 2.62.  He feels well, no pain, except sometime left hip pain when he walks, he uses a crane, no other pain. He has good appetite and energy, he is not very active due to the left hip pain.  INTERIM HISTORY  Dustin [name] returns for follow-up. He underwent a bone marrow biopsy last week,, tolerated very well. He still has moderate left hip pain, works around with a cane, no change, no other new complaints. He feels well overall.  MEDICAL HISTORY:  Past Medical History  Diagnosis Date  . Hypertension   . Chronic kidney disease   . Diabetes mellitus without complication   . PVD (peripheral vascular disease) 2008    stent placement in legs     SURGICAL HISTORY: Past Surgical History  Procedure Laterality Date  . Coronary artery bypass graft  1992  . Coronary angioplasty with stent placement  2008    SOCIAL HISTORY: History   Social History  . Marital Status: Married    Spouse Name: N/A  . Number of Children: 3  . Years of Education: N/A   Occupational History  . Worked for [employer]   Social History  Main Topics  . Smoking status: Never Smoker   . Smokeless tobacco: Not on file  . Alcohol Use: No  . Drug Use: No  . Sexual Activity: Not on file   Other Topics Concern  . Not on file   Social History Narrative  . No narrative on file    FAMILY HISTORY: Family History  Problem Relation Age of Onset  . Heart failure Mother   . Heart failure Father   . Diabetes Sister   . Congenital heart disease Sister   . Cancer Brother 93    prostate and colon cancer     ALLERGIES:  has No Known Allergies.  MEDICATIONS:  Current Outpatient Prescriptions  Medication Sig Dispense Refill  . atorvastatin (LIPITOR) 40 MG tablet Take 20 mg by mouth daily.    . Calcium Citrate 150 MG CAPS Take 300 mg by mouth daily.    . canagliflozin (INVOKANA) 300 MG TABS tablet Take 300 mg by mouth daily.     . Cholecalciferol (VITAMIN D3) 5000 UNITS CAPS Take 5,000 Units by mouth daily.    Marland Kitchen co-enzyme Q-10 30 MG capsule Take 100 mg by mouth daily.    . Cyanocobalamin (B-12) 5000 MCG CAPS Take 1 capsule by mouth daily.    Marland Kitchen glimepiride (AMARYL) 2 MG tablet Take 4 mg by mouth 2 (two) times daily.     Marland Kitchen linagliptin (TRADJENTA) 5 MG TABS tablet Take 5 mg by mouth daily.    Marland Kitchen lisinopril (PRINIVIL,ZESTRIL) 20 MG  tablet Take 20 mg by mouth 2 (two) times daily.    . Magnesium 250 MG TABS Take 2 tablets by mouth daily.    . metoprolol (LOPRESSOR) 50 MG tablet Take 50 mg by mouth 2 (two) times daily.    . NON FORMULARY Take 12.5 mg by mouth daily. Iodine complex    . NON FORMULARY Take 5 mg by mouth daily. dehydroepiandrosterone    . NON FORMULARY Take 5,000 mcg by mouth daily. Folate    . pyridOXINE (VITAMIN B-6) 50 MG tablet Take 50 mg by mouth daily.    . rivaroxaban (XARELTO) 20 MG TABS tablet Take 20 mg by mouth daily with supper.    . Saw Palmetto, Serenoa repens, 320 MG CAPS Take 1 capsule by mouth daily.    . tamsulosin (FLOMAX) 0.4 MG CAPS capsule Take 0.8 mg by mouth at bedtime.     . timolol (TIMOPTIC)  0.5 % ophthalmic solution Place 1 drop into both eyes daily.    . Travoprost, BAK Free, (TRAVATAN) 0.004 % SOLN ophthalmic solution Place 1 drop into both eyes daily.    . Vitamins A & D 5000-400 UNITS CAPS Take 1 capsule by mouth daily.    . nitroGLYCERIN (NITROSTAT) 0.4 MG SL tablet Place 0.4 mg under the tongue every 5 (five) minutes as needed for chest pain.      No current facility-administered medications for this visit.    REVIEW OF SYSTEMS:   Constitutional: Denies fevers, chills or abnormal night sweats Eyes: Denies blurriness of vision, double vision or watery eyes Ears, nose, mouth, throat, and face: Denies mucositis or sore throat Respiratory: Denies cough, dyspnea or wheezes Cardiovascular: Denies palpitation, chest discomfort or lower extremity swelling Gastrointestinal:  Denies nausea, heartburn or change in bowel habits Skin: Denies abnormal skin rashes Lymphatics: Denies new lymphadenopathy or easy bruising Neurological:Denies numbness, tingling or new weaknesses Behavioral/Psych: Mood is stable, no new changes  All other systems were reviewed with the patient and are negative.  PHYSICAL EXAMINATION: ECOG PERFORMANCE STATUS: 0 - Asymptomatic  Filed Vitals:   12/26/14 1236  BP: 123/65  Pulse: 64  Temp: 98.4 F (36.9 C)  Resp: 18   Filed Weights   12/26/14 1236  Weight: 184 lb 3.2 oz (83.553 kg)    GENERAL:alert, no distress and comfortable SKIN: skin color, texture, turgor are normal, no rashes or significant lesions EYES: normal, conjunctiva are pink and non-injected, sclera clear OROPHARYNX:no exudate, no erythema and lips, buccal mucosa, and tongue normal  NECK: supple, thyroid normal size, non-tender, without nodularity LYMPH:  no palpable lymphadenopathy in the cervical, axillary or inguinal LUNGS: clear to auscultation and percussion with normal breathing effort HEART: regular rate & rhythm and no murmurs and no lower extremity edema ABDOMEN:abdomen  soft, non-tender and normal bowel sounds Musculoskeletal:no cyanosis of digits and no clubbing  PSYCH: alert & oriented x 3 with fluent speech NEURO: no focal motor/sensory deficits  LABORATORY DATA:  I have reviewed the data as listed Lab Results  Component Value Date   WBC 7.4 12/19/2014   HGB 14.9 12/19/2014   HCT 44.2 12/19/2014   MCV 91.7 12/19/2014   PLT 182 12/19/2014    Recent Labs  12/05/14 1248  NA 137  K 4.0  CO2 24  GLUCOSE 162*  BUN 23.0  CREATININE 1.2  CALCIUM 9.4  PROT 7.6  ALBUMIN 3.8  AST 26  ALT 36  ALKPHOS 64  BILITOT 0.62   SPEP & IFE with QIG  Status:  Finalresult Visible to patient:  Not Released Nextappt: Today at 12:45 PM in Oncology [facility], Krista Blue, MD) Dx:  MGUS (monoclonal gammopathy of unknow...            Ref Range 3wk ago    IgG (Immunoglobin G), Serum 650 - 1600 mg/dL 1880 (H)   IgA 68 - 379 mg/dL 302   IgM, Serum 41 - 251 mg/dL 46   Immunofix Electr Int  *   Comments: Monoclonal IgG kappa protein is present.Reviewed by Odis Hollingshead, MD, PhD, FCAP (Electronic Signature onFile)   Total Protein, Serum Electrophoresis 6.1 - 8.1 g/dL 7.3   Albumin ELP 3.8 - 4.8 g/dL 4.1   Alpha-1-Globulin 0.2 - 0.3 g/dL 0.2   Alpha-2-Globulin 0.5 - 0.9 g/dL 0.6   Beta Globulin 0.4 - 0.6 g/dL 0.5   Beta 2 0.2 - 0.5 g/dL 0.4   Gamma Globulin 0.8 - 1.7 g/dL 1.6   Abnormal Protein Band1 g/dL 0.6   SPE Interp.  *   Comments: A restricted band consistent with monoclonal protein is present.The monoclonal protein peak accounts for 0.6 g/dL of the total1.6 g/dL of protein in the gamma region.        Kappa/lambda light chains  Status: Finalresult Visible to patient:  Not Released Nextappt: Today at 12:45 PM in [facility], Krista Blue, MD) Dx:  MGUS (monoclonal gammopathy of unknow...            Ref Range 3wk ago    Kappa free light chain 0.33 - 1.94 mg/dL 2.76 (H)   Lambda Free Lght Chn 0.57 - 2.63  mg/dL 1.56   Kappa:Lambda Ratio 0.26 - 1.65  1.77 (H)          24 hour UPEP/IFE 12/08/2014 Comments: No monoclonal free light chains (Bence Jones Protein) are detected.Urine IFE shows polyclonal increase in free Kappa and/or free Lambdalight chains.  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 - SEE COMMENT. Diagnosis Note The bone marrow is normocellular, however, there is a mild increase in plasma cells (9% by aspirate, 5-10% by CD138). Light chain immunohistochemistry reveals an apparent kappa excess. 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 Correlation with clinical, laboratory, and radiographic data is required for further classification.  RADIOGRAPHIC STUDIES: I have personally reviewed the radiological images as listed and agreed with the findings in the report.  Dg Bone Survey Met  12/12/2014   CLINICAL DATA:  Monoclonal gammopathy of uncertain significance. Question multiple myeloma.  EXAM: METASTATIC BONE SURVEY  COMPARISON:  None.  FINDINGS: A single lucent lesion on the lateral view of the skull measuring 0.7 cm is identified. 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 No other lytic lesion is seen. The patient has severe degenerative disease about the hips. 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 The patient is status post CABG. The lungs are clear. Scattered mild appearing spondylosis is seen about the spine. Bilateral iliac stents are in place.  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 No convincing evidence of multiple myeloma is identified.  Severe degenerative disease about the hips.   Electronically Signed   By: Inge Rise M.D.   On: 12/12/2014 13:57     ASSESSMENT & PLAN:  74 year old male with past medical history of diabetes and chronic kidney  disease  1. IgG MGUS -This was discovered by lab test. He is asymptomatic except chronic left hip pain from arthritis. -I reviewed his bone marrow biopsy results, which showed 5-10% of plasma cell. -I reviewed his lab results. SPEP showed monoclonal IgG gammopathy, with low M protein level (0.5-0.6g/dl), serum free Kappa light chain is slightly elevated, light chain ratio was normal. -His CBC was normal, CMP showed a normal calcium level, creatinine 1.44. He does have long-standing history of diabetes, which can cause chronic kidney disease. -A bone survey was negative for lytic lesions.  -Based on the above test results, he has IgG MGUS. No evidence of multiple myeloma. -I reviewed the natural history of MGUS, and the risk of progressing to multiple myeloma (1% per year). I recommend surveillance with labs and office visit every 3-6 months. No treatment is needed.  2. DM, CKD -He will continue follow-up with his primary care physician and nephrologist.  Plan -Return to clinic in 3 months with lab and exam.  All questions were answered. The patient knows to call the clinic with any problems, questions or concerns. I spent 20 minutes counseling the patient face to face. The total time spent in the appointment was 30 minutes and more than 50% was on counseling.     Truitt Merle, MD 12/26/2014 1:22 PM

## 2014-12-27 NOTE — Telephone Encounter (Signed)
Late Entry:  Pt notified no labs needed for 12/26/14 visit.

## 2014-12-28 LAB — CHROMOSOME ANALYSIS, BONE MARROW

## 2015-01-04 ENCOUNTER — Encounter (HOSPITAL_COMMUNITY): Payer: Self-pay

## 2015-03-30 ENCOUNTER — Encounter: Payer: Medicare Other | Admitting: Hematology

## 2015-03-30 ENCOUNTER — Encounter: Payer: Self-pay | Admitting: Hematology

## 2015-03-30 ENCOUNTER — Other Ambulatory Visit: Payer: Medicare Other

## 2015-03-30 NOTE — Progress Notes (Signed)
No show  This encounter was created in error - please disregard.

## 2015-04-02 ENCOUNTER — Other Ambulatory Visit: Payer: Self-pay | Admitting: *Deleted

## 2015-04-02 ENCOUNTER — Telehealth: Payer: Self-pay | Admitting: Hematology

## 2015-04-02 NOTE — Telephone Encounter (Signed)
s.w. pt and advised on NOV appt....pt ok and aware °

## 2015-04-02 NOTE — Telephone Encounter (Signed)
s.w. pt he did not want to r/s appt until he confirmed that his appts were not supposed to be ever 31mths....fwd pt to desk nurse

## 2015-04-03 ENCOUNTER — Ambulatory Visit: Payer: Medicare Other | Admitting: Hematology

## 2015-04-03 ENCOUNTER — Other Ambulatory Visit: Payer: Medicare Other

## 2015-05-04 ENCOUNTER — Other Ambulatory Visit (HOSPITAL_BASED_OUTPATIENT_CLINIC_OR_DEPARTMENT_OTHER): Payer: Medicare Other

## 2015-05-04 ENCOUNTER — Telehealth: Payer: Self-pay | Admitting: Hematology

## 2015-05-04 ENCOUNTER — Encounter: Payer: Self-pay | Admitting: Hematology

## 2015-05-04 ENCOUNTER — Ambulatory Visit (HOSPITAL_BASED_OUTPATIENT_CLINIC_OR_DEPARTMENT_OTHER): Payer: Medicare Other | Admitting: Hematology

## 2015-05-04 VITALS — BP 126/69 | HR 83 | Temp 97.7°F | Resp 18 | Ht 67.0 in | Wt 184.4 lb

## 2015-05-04 DIAGNOSIS — E119 Type 2 diabetes mellitus without complications: Secondary | ICD-10-CM | POA: Diagnosis not present

## 2015-05-04 DIAGNOSIS — D472 Monoclonal gammopathy: Secondary | ICD-10-CM

## 2015-05-04 DIAGNOSIS — N189 Chronic kidney disease, unspecified: Secondary | ICD-10-CM

## 2015-05-04 LAB — CBC WITH DIFFERENTIAL/PLATELET
BASO%: 0.4 % (ref 0.0–2.0)
Basophils Absolute: 0 10*3/uL (ref 0.0–0.1)
EOS%: 0.9 % (ref 0.0–7.0)
Eosinophils Absolute: 0.1 10*3/uL (ref 0.0–0.5)
HCT: 42.8 % (ref 38.4–49.9)
HGB: 15 g/dL (ref 13.0–17.1)
LYMPH%: 27.2 % (ref 14.0–49.0)
MCH: 31.7 pg (ref 27.2–33.4)
MCHC: 35 g/dL (ref 32.0–36.0)
MCV: 90.5 fL (ref 79.3–98.0)
MONO#: 0.7 10*3/uL (ref 0.1–0.9)
MONO%: 9 % (ref 0.0–14.0)
NEUT#: 4.6 10*3/uL (ref 1.5–6.5)
NEUT%: 62.5 % (ref 39.0–75.0)
PLATELETS: 157 10*3/uL (ref 140–400)
RBC: 4.73 10*6/uL (ref 4.20–5.82)
RDW: 13.3 % (ref 11.0–14.6)
WBC: 7.4 10*3/uL (ref 4.0–10.3)
lymph#: 2 10*3/uL (ref 0.9–3.3)

## 2015-05-04 LAB — COMPREHENSIVE METABOLIC PANEL (CC13)
ALT: 26 U/L (ref 0–55)
ANION GAP: 9 meq/L (ref 3–11)
AST: 19 U/L (ref 5–34)
Albumin: 4.1 g/dL (ref 3.5–5.0)
Alkaline Phosphatase: 67 U/L (ref 40–150)
BUN: 23.9 mg/dL (ref 7.0–26.0)
CHLORIDE: 107 meq/L (ref 98–109)
CO2: 23 meq/L (ref 22–29)
CREATININE: 1.6 mg/dL — AB (ref 0.7–1.3)
Calcium: 9.6 mg/dL (ref 8.4–10.4)
EGFR: 48 mL/min/{1.73_m2} — ABNORMAL LOW (ref >90)
Glucose: 205 mg/dl — ABNORMAL HIGH (ref 70–140)
Potassium: 4.2 mEq/L (ref 3.5–5.1)
Sodium: 138 mEq/L (ref 136–145)
Total Bilirubin: 0.77 mg/dL (ref 0.20–1.20)
Total Protein: 7.9 g/dL (ref 6.4–8.3)

## 2015-05-04 NOTE — Progress Notes (Signed)
Dustin [name]  [phone] [phone]  Clinic follow up Note   Patient Care Team: Bonita Quin, MD as PCP - General (Internal Medicine) Truitt Merle, MD as Consulting Physician (Hematology) Foye Spurling, MD as Consulting Physician (Internal Medicine) Elmarie Shiley, MD as Consulting Physician (Nephrology) 05/04/2015  CHIEF COMPLAINTS:  Follow up IgG MGUS  Referring physician:  Dr. Elmarie Shiley  HISTORY OF PRESENTING ILLNESS (11/2014):  Dustin [name] 74 y.o. male is here because of abnormal SPEP.  He has long-standing history of diabetes, and chronic kidney disease. He was recently referred to a nephrologist Dr. Posey Pronto on 11/01/2014. As part of her workup, SPEP and light chain level was checked. 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 Serum free coprolite chain was elevated at 39, lambda light chain was normal, ratio was 2.62.  He feels well, no pain, except sometime left hip pain when he walks, he uses a crane, no other pain. He has good appetite and energy, he is not very active due to the left hip pain.  INTERIM HISTORY  Dustin [name] returns for follow-up. He has been doing well overall. He still has mild to moderate left hip pain, which is stable, he walks with a cane. He has mild fatigue, no other new complaints. He is not very physically active, his wife thinks he is not drinking enough liquids. His appetite is moderate. No recent weight loss. No other new pain, fever or night sweats.  MEDICAL HISTORY:  Past Medical History  Diagnosis Date  . Hypertension   . Chronic kidney disease   . Diabetes mellitus without complication   . PVD (peripheral vascular disease) 2008    stent placement in legs     SURGICAL HISTORY: Past Surgical History  Procedure Laterality Date  . Coronary artery bypass graft  1992  . Coronary angioplasty with stent placement  2008    SOCIAL HISTORY: History   Social History  . Marital Status: Married      Spouse Name: N/A  . Number of Children: 3  . Years of Education: N/A   Occupational History  . Worked for [employer]   Social History Main Topics  . Smoking status: Never Smoker   . Smokeless tobacco: Not on file  . Alcohol Use: No  . Drug Use: No  . Sexual Activity: Not on file   Other Topics Concern  . Not on file   Social History Narrative  . No narrative on file    FAMILY HISTORY: Family History  Problem Relation Age of Onset  . Heart failure Mother   . Heart failure Father   . Diabetes Sister   . Congenital heart disease Sister   . Cancer Brother 68    prostate and colon cancer     ALLERGIES:  has No Known Allergies.  MEDICATIONS:  Current Outpatient Prescriptions  Medication Sig Dispense Refill  . atorvastatin (LIPITOR) 40 MG tablet Take 20 mg by mouth daily.    . Calcium Citrate 150 MG CAPS Take 300 mg by mouth daily.    . canagliflozin (INVOKANA) 300 MG TABS tablet Take 300 mg by mouth daily.     . Cholecalciferol (VITAMIN D3) 5000 UNITS CAPS Take 5,000 Units by mouth daily.    Marland Kitchen co-enzyme Q-10 30 MG capsule Take 100 mg by mouth daily.    . Cyanocobalamin (B-12) 5000 MCG CAPS Take 1 capsule by mouth daily.    . furosemide (LASIX) 20 MG tablet Take 20 mg  by mouth as needed.  0  . glimepiride (AMARYL) 2 MG tablet Take 4 mg by mouth 2 (two) times daily.     Marland Kitchen linagliptin (TRADJENTA) 5 MG TABS tablet Take 5 mg by mouth daily.    Marland Kitchen lisinopril (PRINIVIL,ZESTRIL) 20 MG tablet Take 20 mg by mouth 2 (two) times daily.    . Magnesium 250 MG TABS Take 2 tablets by mouth daily.    . metoprolol (LOPRESSOR) 50 MG tablet Take 50 mg by mouth 2 (two) times daily.    . NON FORMULARY Take 12.5 mg by mouth daily. Iodine complex    . NON FORMULARY Take 5 mg by mouth daily. dehydroepiandrosterone    . NON FORMULARY Take 5,000 mcg by mouth daily. Folate    . nystatin-triamcinolone (MYCOLOG II) cream as needed.  3  . pyridOXINE (VITAMIN B-6) 50 MG tablet Take 50 mg  by mouth daily.    . rivaroxaban (XARELTO) 20 MG TABS tablet Take 20 mg by mouth daily with supper.    . Saw Palmetto, Serenoa repens, 320 MG CAPS Take 1 capsule by mouth daily.    . sildenafil (VIAGRA) 50 MG tablet Take by mouth as needed.    . tamsulosin (FLOMAX) 0.4 MG CAPS capsule Take 0.8 mg by mouth at bedtime.     . timolol (TIMOPTIC) 0.5 % ophthalmic solution Place 1 drop into both eyes daily.    . Travoprost, BAK Free, (TRAVATAN) 0.004 % SOLN ophthalmic solution Place 1 drop into both eyes daily.    . Vitamins A & D 5000-400 UNITS CAPS Take 1 capsule by mouth daily.    . nitroGLYCERIN (NITROSTAT) 0.4 MG SL tablet Place 0.4 mg under the tongue every 5 (five) minutes as needed for chest pain.      No current facility-administered medications for this visit.    REVIEW OF SYSTEMS:   Constitutional: Denies fevers, chills or abnormal night sweats Eyes: Denies blurriness of vision, double vision or watery eyes Ears, nose, mouth, throat, and face: Denies mucositis or sore throat Respiratory: Denies cough, dyspnea or wheezes Cardiovascular: Denies palpitation, chest discomfort or lower extremity swelling Gastrointestinal:  Denies nausea, heartburn or change in bowel habits Skin: Denies abnormal skin rashes Lymphatics: Denies new lymphadenopathy or easy bruising Neurological:Denies numbness, tingling or new weaknesses Behavioral/Psych: Mood is stable, no new changes  All other systems were reviewed with the patient and are negative.  PHYSICAL EXAMINATION: ECOG PERFORMANCE STATUS: 1  Filed Vitals:   05/04/15 1255  BP: 126/69  Pulse: 83  Temp: 97.7 F (36.5 C)  Resp: 18   Filed Weights   05/04/15 1255  Weight: 184 lb 6.4 oz (83.643 kg)    GENERAL:alert, no distress and comfortable SKIN: skin color, texture, turgor are normal, no rashes or significant lesions EYES: normal, conjunctiva are pink and non-injected, sclera clear OROPHARYNX:no exudate, no erythema and lips, buccal  mucosa, and tongue normal  NECK: supple, thyroid normal size, non-tender, without nodularity LYMPH:  no palpable lymphadenopathy in the cervical, axillary or inguinal LUNGS: clear to auscultation and percussion with normal breathing effort HEART: regular rate & rhythm and no murmurs and no lower extremity edema ABDOMEN:abdomen soft, non-tender and normal bowel sounds Musculoskeletal:no cyanosis of digits and no clubbing  PSYCH: alert & oriented x 3 with fluent speech NEURO: no focal motor/sensory deficits  LABORATORY DATA:  I have reviewed the data as listed CBC Latest Ref Rng 05/04/2015 12/19/2014 12/05/2014  WBC 4.0 - 10.3 10e3/uL 7.4 7.4 7.1  Hemoglobin  13.0 - 17.1 g/dL 15.0 14.9 14.7  Hematocrit 38.4 - 49.9 % 42.8 44.2 42.7  Platelets 140 - 400 10e3/uL 157 182 165    Recent Labs  12/05/14 1248 05/04/15 1211  NA 137 138  K 4.0 4.2  CO2 24 23  GLUCOSE 162* 205*  BUN 23.0 23.9  CREATININE 1.2 1.6*  CALCIUM 9.4 9.6  PROT 7.6 7.9  ALBUMIN 3.8 4.1  AST 26 19  ALT 36 26  ALKPHOS 64 67  BILITOT 0.62 0.77   MGUS LAB: M-protein  12/05/2014: 0.6  IgG 12/05/14 1880  KAPPA, LAMBDA light chains and rat 12/05/2014 and are : 2.76, 1.56, 1.77  24 hour UPEP/IFE 12/08/2014 Comments: No monoclonal free light chains (Bence Jones Protein) are detected.Urine IFE shows polyclonal increase in free Kappa and/or free Lambdalight chains.  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 - SEE COMMENT. Diagnosis Note The bone marrow is normocellular, however, there is a mild increase in plasma cells (9% by aspirate, 5-10% by CD138). Light chain immunohistochemistry reveals an apparent kappa excess. 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 Correlation with clinical, laboratory, and radiographic data is required for further classification.  RADIOGRAPHIC STUDIES: I have personally  reviewed the radiological images as listed and agreed with the findings in the report.  Dg Bone Survey Met  12/12/2014   CLINICAL DATA:  Monoclonal gammopathy of uncertain significance. Question multiple myeloma.  EXAM: METASTATIC BONE SURVEY  COMPARISON:  None.  FINDINGS: A single lucent lesion on the lateral view of the skull measuring 0.7 cm is identified. 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 No other lytic lesion is seen. The patient has severe degenerative disease about the hips. 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 The patient is status post CABG. The lungs are clear. Scattered mild appearing spondylosis is seen about the spine. Bilateral iliac stents are in place.  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 No convincing evidence of multiple myeloma is identified.  Severe degenerative disease about the hips.   Electronically Signed   By: Inge Rise M.D.   On: 12/12/2014 13:57    ASSESSMENT & PLAN:  74 year old male with past medical history of diabetes and chronic kidney disease  1. IgG MGUS -This was discovered by lab test. He is asymptomatic except chronic left hip pain from arthritis. -I reviewed his bone marrow biopsy results, which showed 5-10% of plasma cell. -I reviewed his lab results. SPEP showed monoclonal IgG gammopathy, with low M protein level (0.5-0.6g/dl), serum free Kappa light chain is slightly elevated, light chain ratio was normal. -His CBC was normal, CMP showed a normal calcium level, creatinine 1.44. He does have long-standing history of HTN and diabetes, which can cause chronic kidney disease. -A bone survey was negative for lytic lesions.  -Based on the above test results, he has IgG MGUS. No evidence of multiple myeloma. -I reviewed the natural history of MGUS,  and the risk of progressing to multiple myeloma (1% per year). I recommend surveillance with labs and office visit every 3-6 months. No treatment is needed. -I reviewed his lab results today, his CBC normal, CMP showed slightly increased creatinine 1.6, calcium level was normal. His SPEP and light chain levels are still pending. I'll call him about these  results next week.  -His clinical doing well and stable, we'll continue monitoring. If his M protein has been stable, we can space out his follow-up interval.   2. DM, CKD -He will continue follow-up with his primary care physician and nephrologist. -I discussed his Cr result, and will copy Dr. Posey Pronto   Plan -Return to clinic in 3 months with lab and exam on same day.  -will call him next week about the rest of his lab result   All questions were answered. The patient knows to call the clinic with any problems, questions or concerns. I spent 20 minutes counseling the patient face to face. The total time spent in the appointment was 30 minutes and more than 50% was on counseling.     Truitt Merle, MD 05/04/2015 1:42 PM

## 2015-05-04 NOTE — Telephone Encounter (Signed)
Gave patient avs report and appointments for February 2017.  °

## 2015-05-08 LAB — PROTEIN ELECTROPHORESIS, SERUM
ABNORMAL PROTEIN BAND1: 0.6 g/dL
ALPHA-2-GLOBULIN: 0.6 g/dL (ref 0.5–0.9)
Albumin ELP: 4.3 g/dL (ref 3.8–4.8)
Alpha-1-Globulin: 0.3 g/dL (ref 0.2–0.3)
BETA 2: 0.4 g/dL (ref 0.2–0.5)
BETA GLOBULIN: 0.5 g/dL (ref 0.4–0.6)
GAMMA GLOBULIN: 1.8 g/dL — AB (ref 0.8–1.7)
TOTAL PROTEIN, SERUM ELECTROPHOR: 7.8 g/dL (ref 6.1–8.1)

## 2015-05-08 LAB — KAPPA/LAMBDA LIGHT CHAINS
KAPPA LAMBDA RATIO: 2.45 — AB (ref 0.26–1.65)
Kappa free light chain: 4.14 mg/dL — ABNORMAL HIGH (ref 0.33–1.94)
Lambda Free Lght Chn: 1.69 mg/dL (ref 0.57–2.63)

## 2015-05-10 ENCOUNTER — Telehealth: Payer: Self-pay | Admitting: Hematology

## 2015-05-10 NOTE — Telephone Encounter (Signed)
I called pt ans spoke with his wife regarding his SPEP result from last week, which showed stable M-protein and light chains levels. She  Voiced good understanding and appreciate to call.  Truitt Merle 05/10/2015

## 2015-07-04 ENCOUNTER — Encounter (HOSPITAL_COMMUNITY): Payer: Self-pay

## 2015-07-04 DIAGNOSIS — Z7901 Long term (current) use of anticoagulants: Secondary | ICD-10-CM | POA: Insufficient documentation

## 2015-07-04 DIAGNOSIS — L22 Diaper dermatitis: Secondary | ICD-10-CM | POA: Diagnosis not present

## 2015-07-04 DIAGNOSIS — N390 Urinary tract infection, site not specified: Secondary | ICD-10-CM | POA: Insufficient documentation

## 2015-07-04 DIAGNOSIS — I4891 Unspecified atrial fibrillation: Secondary | ICD-10-CM | POA: Diagnosis not present

## 2015-07-04 DIAGNOSIS — I129 Hypertensive chronic kidney disease with stage 1 through stage 4 chronic kidney disease, or unspecified chronic kidney disease: Secondary | ICD-10-CM | POA: Diagnosis not present

## 2015-07-04 DIAGNOSIS — B372 Candidiasis of skin and nail: Secondary | ICD-10-CM | POA: Insufficient documentation

## 2015-07-04 DIAGNOSIS — Z9861 Coronary angioplasty status: Secondary | ICD-10-CM | POA: Diagnosis not present

## 2015-07-04 DIAGNOSIS — Z7984 Long term (current) use of oral hypoglycemic drugs: Secondary | ICD-10-CM | POA: Diagnosis not present

## 2015-07-04 DIAGNOSIS — Z79899 Other long term (current) drug therapy: Secondary | ICD-10-CM | POA: Insufficient documentation

## 2015-07-04 DIAGNOSIS — N189 Chronic kidney disease, unspecified: Secondary | ICD-10-CM | POA: Insufficient documentation

## 2015-07-04 DIAGNOSIS — Z951 Presence of aortocoronary bypass graft: Secondary | ICD-10-CM | POA: Diagnosis not present

## 2015-07-04 DIAGNOSIS — R509 Fever, unspecified: Secondary | ICD-10-CM | POA: Diagnosis present

## 2015-07-04 DIAGNOSIS — E119 Type 2 diabetes mellitus without complications: Secondary | ICD-10-CM | POA: Insufficient documentation

## 2015-07-04 DIAGNOSIS — Z7952 Long term (current) use of systemic steroids: Secondary | ICD-10-CM | POA: Insufficient documentation

## 2015-07-04 LAB — CBC WITH DIFFERENTIAL/PLATELET
BASOS ABS: 0 10*3/uL (ref 0.0–0.1)
Basophils Relative: 0 %
EOS ABS: 0 10*3/uL (ref 0.0–0.7)
Eosinophils Relative: 0 %
HCT: 41 % (ref 39.0–52.0)
Hemoglobin: 13.6 g/dL (ref 13.0–17.0)
Lymphocytes Relative: 16 %
Lymphs Abs: 1.2 10*3/uL (ref 0.7–4.0)
MCH: 30.6 pg (ref 26.0–34.0)
MCHC: 33.2 g/dL (ref 30.0–36.0)
MCV: 92.1 fL (ref 78.0–100.0)
Monocytes Absolute: 1.6 10*3/uL — ABNORMAL HIGH (ref 0.1–1.0)
Monocytes Relative: 22 %
NEUTROS ABS: 4.5 10*3/uL (ref 1.7–7.7)
Neutrophils Relative %: 62 %
Platelets: 137 10*3/uL — ABNORMAL LOW (ref 150–400)
RBC: 4.45 MIL/uL (ref 4.22–5.81)
RDW: 13.9 % (ref 11.5–15.5)
WBC: 7.3 10*3/uL (ref 4.0–10.5)

## 2015-07-04 LAB — URINE MICROSCOPIC-ADD ON

## 2015-07-04 LAB — COMPREHENSIVE METABOLIC PANEL
ALT: 32 U/L (ref 17–63)
AST: 42 U/L — ABNORMAL HIGH (ref 15–41)
Albumin: 3.4 g/dL — ABNORMAL LOW (ref 3.5–5.0)
Alkaline Phosphatase: 56 U/L (ref 38–126)
Anion gap: 10 (ref 5–15)
BUN: 24 mg/dL — ABNORMAL HIGH (ref 6–20)
CALCIUM: 8.8 mg/dL — AB (ref 8.9–10.3)
CHLORIDE: 104 mmol/L (ref 101–111)
CO2: 17 mmol/L — ABNORMAL LOW (ref 22–32)
CREATININE: 1.29 mg/dL — AB (ref 0.61–1.24)
GFR, EST NON AFRICAN AMERICAN: 53 mL/min — AB (ref >60)
Glucose, Bld: 120 mg/dL — ABNORMAL HIGH (ref 65–99)
Potassium: 4.1 mmol/L (ref 3.5–5.1)
SODIUM: 131 mmol/L — AB (ref 135–145)
Total Bilirubin: 0.8 mg/dL (ref 0.3–1.2)
Total Protein: 8.3 g/dL — ABNORMAL HIGH (ref 6.5–8.1)

## 2015-07-04 LAB — URINALYSIS, ROUTINE W REFLEX MICROSCOPIC
BILIRUBIN URINE: NEGATIVE
Glucose, UA: >1000 mg/dL — AB
Ketones, ur: NEGATIVE mg/dL
Nitrite: NEGATIVE
Protein, ur: NEGATIVE mg/dL
Specific Gravity, Urine: 1.021 (ref 1.005–1.030)
pH: 5 (ref 5.0–8.0)

## 2015-07-04 LAB — I-STAT CG4 LACTIC ACID, ED: LACTIC ACID, VENOUS: 1.13 mmol/L (ref 0.5–2.0)

## 2015-07-04 MED ORDER — ACETAMINOPHEN 325 MG PO TABS
650.0000 mg | ORAL_TABLET | Freq: Once | ORAL | Status: AC | PRN
  Administered 2015-07-04: 650 mg via ORAL

## 2015-07-04 MED ORDER — ACETAMINOPHEN 325 MG PO TABS
ORAL_TABLET | ORAL | Status: AC
  Filled 2015-07-04: qty 2

## 2015-07-04 NOTE — ED Notes (Addendum)
Here with wife for not feeling great since new years night. Has been running a low grade fever, tired and wife says he has lost his upper and lower body strength. Monday night passed out and so weak he couldn't get up and lost control of his bowels. Tuesday started vomiting. Went to UC today and they sent him here. Had a chest xray done at [facility] and brought disc with him. Pt also taking xarelto for afib

## 2015-07-04 NOTE — ED Notes (Signed)
No answer for re-assessment.

## 2015-07-05 ENCOUNTER — Emergency Department (HOSPITAL_COMMUNITY)
Admission: EM | Admit: 2015-07-05 | Discharge: 2015-07-05 | Disposition: A | Discharge status: Home / Self Care | Payer: Medicare Other | Attending: Emergency Medicine | Admitting: Emergency Medicine

## 2015-07-05 ENCOUNTER — Emergency Department (HOSPITAL_COMMUNITY): Payer: Medicare Other

## 2015-07-05 DIAGNOSIS — N390 Urinary tract infection, site not specified: Secondary | ICD-10-CM

## 2015-07-05 DIAGNOSIS — B372 Candidiasis of skin and nail: Secondary | ICD-10-CM

## 2015-07-05 DIAGNOSIS — L22 Diaper dermatitis: Secondary | ICD-10-CM

## 2015-07-05 HISTORY — DX: Unspecified atrial fibrillation: I48.91

## 2015-07-05 LAB — I-STAT VENOUS BLOOD GAS, ED
Acid-base deficit: 2 mmol/L (ref 0.0–2.0)
Bicarbonate: 22.6 mEq/L (ref 20.0–24.0)
O2 Saturation: 42 %
PH VEN: 7.382 — AB (ref 7.250–7.300)
TCO2: 24 mmol/L (ref 0–100)
pCO2, Ven: 37.9 mmHg — ABNORMAL LOW (ref 45.0–50.0)
pO2, Ven: 24 mmHg — CL (ref 30.0–45.0)

## 2015-07-05 LAB — TROPONIN I: Troponin I: <0.03 ng/mL (ref <0.031)

## 2015-07-05 LAB — I-STAT CG4 LACTIC ACID, ED: Lactic Acid, Venous: 1.07 mmol/L (ref 0.5–2.0)

## 2015-07-05 MED ORDER — CEPHALEXIN 500 MG PO CAPS: 500.0000 mg | ORAL_CAPSULE | Freq: Two times a day (BID) | ORAL | Status: DC

## 2015-07-05 MED ORDER — CEPHALEXIN 250 MG PO CAPS
500.0000 mg | ORAL_CAPSULE | Freq: Once | ORAL | Status: AC
  Administered 2015-07-05: 500 mg via ORAL
  Filled 2015-07-05: qty 2

## 2015-07-05 MED ORDER — FLUCONAZOLE 100 MG PO TABS
150.0000 mg | ORAL_TABLET | Freq: Once | ORAL | Status: AC
  Administered 2015-07-05: 150 mg via ORAL
  Filled 2015-07-05 (×2): qty 2

## 2015-07-05 MED ORDER — CLOTRIMAZOLE 1 % EX CREA: TOPICAL_CREAM | CUTANEOUS | Status: DC

## 2015-07-05 NOTE — ED Provider Notes (Signed)
CSN: [id number]     Arrival date & time 07/04/15  [id number] History  By signing my name below, I, Evelene Croon, attest that this documentation has been prepared under the direction and in the presence of [name], MD . Electronically Signed: Evelene Croon, Scribe. 07/05/2015. 3:37 AM.    Chief Complaint  Patient presents with  . URI  . Fatigue  . Fever   The history is provided by the patient and the spouse. No language interpreter was used.    HPI Comments:  Dustin [name] is a 75 y.o. male with a history of AFIB, HTN, DM, CKD, and PVD, who presents to the Emergency Department complaining of generalized weakness x ~ 2 days with associated fever today and unsteady gait, which has improved at this time. Wife notes pt fell twice 2 nights ago due to his balance; states he lost lost control of bowels. Wife also reports cough, congestion,  and rhinorrhea ~ 4 days ago. Pt denies sputum production but reports rattling with cough. He also notes 1 episode of vomiting and polyuria; ~ every 2 hours. Pt was evaluated at urgent care yesterday (07/04/15) and advised to come to the ED. No alleviating factors noted. He denies HA,neck stiffness, CP, SOB, wheezing, diarrhea, dysuria, vision change, and unilateral weakness. He is currently on xarelto.    Past Medical History  Diagnosis Date  . Hypertension   . Chronic kidney disease   . Diabetes mellitus without complication ([place])   . PVD (peripheral vascular disease) ([place]) 2008    stent placement in legs   . A-fib [facility])    Past Surgical History  Procedure Laterality Date  . Coronary artery bypass graft  1992  . Coronary angioplasty with stent placement  2008   Family History  Problem Relation Age of Onset  . Heart failure Mother   . Heart failure Father   . Diabetes Sister   . Congenital heart disease Sister   . Cancer Brother 85    prostate and colon cancer    Social History  Substance Use Topics  . Smoking status: Never Smoker   . Smokeless  tobacco: None  . Alcohol Use: No    Review of Systems  10 systems reviewed and all are negative for acute change except as noted in the HPI.   Allergies  Review of patient's allergies indicates no known allergies.  Home Medications   Prior to Admission medications   Medication Sig Start Date End Date Taking? Authorizing Provider  atorvastatin (LIPITOR) 40 MG tablet Take 20 mg by mouth daily.   Yes Historical Provider, MD  Calcium Citrate 150 MG CAPS Take 300 mg by mouth daily.   Yes Historical Provider, MD  canagliflozin (INVOKANA) 300 MG TABS tablet Take 300 mg by mouth daily.    Yes Historical Provider, MD  Cholecalciferol (VITAMIN D3) 5000 UNITS CAPS Take 5,000 Units by mouth daily.   Yes Historical Provider, MD  co-enzyme Q-10 30 MG capsule Take 100 mg by mouth daily.   Yes Historical Provider, MD  Cyanocobalamin (B-12) 5000 MCG CAPS Take 1 capsule by mouth daily.   Yes Historical Provider, MD  furosemide (LASIX) 20 MG tablet Take 20 mg by mouth as needed. 02/28/15  Yes Historical Provider, MD  glimepiride (AMARYL) 2 MG tablet Take 4 mg by mouth 2 (two) times daily.    Yes Historical Provider, MD  linagliptin (TRADJENTA) 5 MG TABS tablet Take 5 mg by mouth daily.   Yes Historical Provider, MD  lisinopril (PRINIVIL,ZESTRIL) 20 MG tablet Take 20 mg by mouth 2 (two) times daily.   Yes Historical Provider, MD  Magnesium 250 MG TABS Take 2 tablets by mouth daily.   Yes Historical Provider, MD  metoprolol (LOPRESSOR) 50 MG tablet Take 50 mg by mouth 2 (two) times daily.   Yes Historical Provider, MD  nitroGLYCERIN (NITROSTAT) 0.4 MG SL tablet Place 0.4 mg under the tongue every 5 (five) minutes as needed for chest pain.  03/24/14  Yes Historical Provider, MD  NON FORMULARY Take 12.5 mg by mouth daily. Iodine complex   Yes Historical Provider, MD  NON FORMULARY Take 5,000 mcg by mouth daily. Folate   Yes Historical Provider, MD  nystatin-triamcinolone (MYCOLOG II) cream Apply 1 application  topically daily as needed. For rash 02/28/15  Yes Historical Provider, MD  pyridOXINE (VITAMIN B-6) 50 MG tablet Take 50 mg by mouth daily.   Yes Historical Provider, MD  rivaroxaban (XARELTO) 20 MG TABS tablet Take 20 mg by mouth daily with supper.   Yes Historical Provider, MD  Saw Palmetto, Serenoa repens, 320 MG CAPS Take 1 capsule by mouth daily.   Yes Historical Provider, MD  sildenafil (VIAGRA) 50 MG tablet Take 25 mg by mouth daily as needed for erectile dysfunction.  02/23/15  Yes Historical Provider, MD  tamsulosin (FLOMAX) 0.4 MG CAPS capsule Take 0.8 mg by mouth at bedtime.    Yes Historical Provider, MD  timolol (TIMOPTIC) 0.5 % ophthalmic solution Place 1 drop into both eyes daily.   Yes Historical Provider, MD  Travoprost, BAK Free, (TRAVATAN) 0.004 % SOLN ophthalmic solution Place 1 drop into both eyes daily.   Yes Historical Provider, MD  Vitamins A & D 5000-400 UNITS CAPS Take 1 capsule by mouth daily.   Yes Historical Provider, MD  cephALEXin (KEFLEX) 500 MG capsule Take 1 capsule (500 mg total) by mouth 2 (two) times daily. 07/05/15   [name], MD  clotrimazole (LOTRIMIN) 1 % cream Apply to affected area 2 times daily 07/05/15   [name], MD  NON FORMULARY Take 5 mg by mouth daily. dehydroepiandrosterone    Historical Provider, MD   BP 157/101 mmHg  Pulse 80  Temp(Src) 98 F (36.7 C) (Oral)  Resp 16  Ht 5\' 6"  (1.676 m)  Wt 181 lb 8 oz (82.328 kg)  BMI 29.31 kg/m2  SpO2 96% Physical Exam  Constitutional: He is oriented to person, place, and time. He appears well-developed and well-nourished. No distress.  HENT:  Head: Normocephalic and atraumatic.  Eyes: Conjunctivae are normal. Pupils are equal, round, and reactive to light.  Pupils 2 mm and equal  Neck: JVD present.  Cardiovascular: Normal rate.  An irregularly irregular rhythm present.  HR 68 bpm  Pulmonary/Chest: Effort normal and breath sounds normal. No respiratory distress.  Lungs are clear to  auscultation bilaterally.   Abdominal: He exhibits no distension.  Genitourinary:  Erythema over scrotum and around meatus and distal part of foreskin Chaperone (scribe) was present for exam which was performed with no discomfort or complications.    Musculoskeletal: He exhibits no edema.  Neurological: He is alert and oriented to person, place, and time. No cranial nerve deficit.  Cranial Nerves 2-12 intact  BUE & BLE sensory exam is equal  BLE & BUE strength 4+/5 bilaterally  Cerebellar exam, finger to nose is nml  Skin: Skin is warm and dry.  3 cm raised erythematous  lesion to left back Lesion is non tender,  no vesicles/  pustules surrounding it; no drainage  Psychiatric: He has a normal mood and affect.  Nursing note and vitals reviewed.   ED Course  Procedures   DIAGNOSTIC STUDIES:  Oxygen Saturation is 99% on RA, normal by my interpretation.    COORDINATION OF CARE:  1:33 AM Discussed treatment plan with pt and wife at bedside and they agreed to plan.   Labs Review Labs Reviewed  COMPREHENSIVE METABOLIC PANEL - Abnormal; Notable for the following:    Sodium 131 (*)    CO2 17 (*)    Glucose, Bld 120 (*)    BUN 24 (*)    Creatinine, Ser 1.29 (*)    Calcium 8.8 (*)    Total Protein 8.3 (*)    Albumin 3.4 (*)    AST 42 (*)    GFR calc non Af Amer 53 (*)    All other components within normal limits  URINALYSIS, ROUTINE W REFLEX MICROSCOPIC (NOT AT [facility]) - Abnormal; Notable for the following:    Glucose, UA >1000 (*)    Hgb urine dipstick SMALL (*)    Leukocytes, UA SMALL (*)    All other components within normal limits  CBC WITH DIFFERENTIAL/PLATELET - Abnormal; Notable for the following:    Platelets 137 (*)    Monocytes Absolute 1.6 (*)    All other components within normal limits  URINE MICROSCOPIC-ADD ON - Abnormal; Notable for the following:    Squamous Epithelial / LPF 0-5 (*)    Bacteria, UA RARE (*)    Casts GRANULAR CAST (*)    All other  components within normal limits  I-STAT VENOUS BLOOD GAS, ED - Abnormal; Notable for the following:    pH, Ven 7.382 (*)    pCO2, Ven 37.9 (*)    pO2, Ven 24.0 (*)    All other components within normal limits  CULTURE, BLOOD (ROUTINE X 2)  CULTURE, BLOOD (ROUTINE X 2)  URINE CULTURE  TROPONIN I  I-STAT CG4 LACTIC ACID, ED  I-STAT CG4 LACTIC ACID, ED    Imaging Review No results found. I have personally reviewed and evaluated these images and lab results as part of my medical decision-making.   EKG Interpretation None      MDM   Final diagnoses:  UTI (lower urinary tract infection)  Candidal diaper dermatitis    I personally performed the services described in this documentation, which was scribed in my presence. The recorded information has been reviewed and is accurate.  Pt comes in with cc of weakness and resultant fall. Has polyuria, and a candida infection / diaper rash. 3/3 SIRs at arrival. Lactate ordered. Clinically has UTI. Lungs are clear. No WC, no pleuritic chest pain. CXR ? PNA - but exam showing no focal rhonchi.  Pt given antibiotics in the ER. Lactate is normal. Pt ambulated in the ER, and he did well Will dc, strict ER return precautions discussed, and pt and family in agreement with the plan.    [name], MD 07/09/15 [id number]

## 2015-07-05 NOTE — ED Notes (Signed)
The pt returned from c-t and xray..  Drowsy .  Med given

## 2015-07-05 NOTE — ED Notes (Signed)
To ct

## 2015-07-05 NOTE — ED Notes (Signed)
Pt verbalized understanding of d/c instructions, prescriptions, and follow-up care. No further questions/concerns, VSS, assisted to lobby in wheelchair.  

## 2015-07-05 NOTE — Discharge Instructions (Signed)
Please take the medicine as prescribed.  APPLY A BARRIER CREAM - LIKE VASELINE to the rash in the groin. You can apply it over the treatment cream as well. Antibiotics for UTI given. See your doctor in 3 - 5 days.   Urinary Tract Infection Urinary tract infections (UTIs) can develop anywhere along your urinary tract. Your urinary tract is your body's drainage system for removing wastes and extra water. Your urinary tract includes two kidneys, two ureters, a bladder, and a urethra. Your kidneys are a pair of bean-shaped organs. Each kidney is about the size of your fist. They are located below your ribs, one on each side of your spine. CAUSES Infections are caused by microbes, which are microscopic organisms, including fungi, viruses, and bacteria. These organisms are so small that they can only be seen through a microscope. Bacteria are the microbes that most commonly cause UTIs. SYMPTOMS  Symptoms of UTIs may vary by age and gender of the patient and by the location of the infection. Symptoms in young women typically include a frequent and intense urge to urinate and a painful, burning feeling in the bladder or urethra during urination. Older women and men are more likely to be tired, shaky, and weak and have muscle aches and abdominal pain. A fever may mean the infection is in your kidneys. Other symptoms of a kidney infection include pain in your back or sides below the ribs, nausea, and vomiting. DIAGNOSIS To diagnose a UTI, your caregiver will ask you about your symptoms. Your caregiver will also ask you to provide a urine sample. The urine sample will be tested for bacteria and white blood cells. White blood cells are made by your body to help fight infection. TREATMENT  Typically, UTIs can be treated with medication. Because most UTIs are caused by a bacterial infection, they usually can be treated with the use of antibiotics. The choice of antibiotic and length of treatment depend on your  symptoms and the type of bacteria causing your infection. HOME CARE INSTRUCTIONS  If you were prescribed antibiotics, take them exactly as your caregiver instructs you. Finish the medication even if you feel better after you have only taken some of the medication.  Drink enough water and fluids to keep your urine clear or pale yellow.  Avoid caffeine, tea, and carbonated beverages. They tend to irritate your bladder.  Empty your bladder often. Avoid holding urine for long periods of time.  Empty your bladder before and after sexual intercourse.  After a bowel movement, women should cleanse from front to back. Use each tissue only once. SEEK MEDICAL CARE IF:   You have back pain.  You develop a fever.  Your symptoms do not begin to resolve within 3 days. SEEK IMMEDIATE MEDICAL CARE IF:   You have severe back pain or lower abdominal pain.  You develop chills.  You have nausea or vomiting.  You have continued burning or discomfort with urination. MAKE SURE YOU:   Understand these instructions.  Will watch your condition.  Will get help right away if you are not doing well or get worse.   This information is not intended to replace advice given to you by your health care provider. Make sure you discuss any questions you have with your health care provider.   Document Released: 03/26/2005 Document Revised: 03/07/2015 Document Reviewed: 07/25/2011 Elsevier Interactive Patient Education 2016 Elsevier [employer] Candida Infection, Adult A Candida infection (also called yeast, fungus, and Monilia infection) is an overgrowth of  yeast that can occur anywhere on the body. A yeast infection commonly occurs in warm, moist body areas. Usually, the infection remains localized but can spread to become a systemic infection. A yeast infection may be a sign of a more severe disease such as diabetes, leukemia, or AIDS. A yeast infection can occur in both men and women. In women, Candida vaginitis  is a vaginal infection. It is one of the most common causes of vaginitis. Men usually do not have symptoms or know they have an infection until other problems develop. Men may find out they have a yeast infection because their sex partner has a yeast infection. Uncircumcised men are more likely to get a yeast infection than circumcised men. This is because the uncircumcised glans is not exposed to air and does not remain as dry as that of a circumcised glans. Older adults may develop yeast infections around dentures. CAUSES  Women  Antibiotics.  Steroid medication taken for a long time.  Being overweight (obese).  Diabetes.  Poor immune condition.  Certain serious medical conditions.  Immune suppressive medications for organ transplant patients.  Chemotherapy.  Pregnancy.  Menstruation.  Stress and fatigue.  Intravenous drug use.  Oral contraceptives.  Wearing tight-fitting clothes in the crotch area.  Catching it from a sex partner who has a yeast infection.  Spermicide.  Intravenous, urinary, or other catheters. Men  Catching it from a sex partner who has a yeast infection.  Having oral or anal sex with a person who has the infection.  Spermicide.  Diabetes.  Antibiotics.  Poor immune system.  Medications that suppress the immune system.  Intravenous drug use.  Intravenous, urinary, or other catheters. SYMPTOMS  Women  Thick, white vaginal discharge.  Vaginal itching.  Redness and swelling in and around the vagina.  Irritation of the lips of the vagina and perineum.  Blisters on the vaginal lips and perineum.  Painful sexual intercourse.  Low blood sugar (hypoglycemia).  Painful urination.  Bladder infections.  Intestinal problems such as constipation, indigestion, bad breath, bloating, increase in gas, diarrhea, or loose stools. Men  Men may develop intestinal problems such as constipation, indigestion, bad breath, bloating, increase  in gas, diarrhea, or loose stools.  Dry, cracked skin on the penis with itching or discomfort.  Jock itch.  Dry, flaky skin.  Athlete's foot.  Hypoglycemia. DIAGNOSIS  Women  A history and an exam are performed.  The discharge may be examined under a microscope.  A culture may be taken of the discharge. Men  A history and an exam are performed.  Any discharge from the penis or areas of cracked skin will be looked at under the microscope and cultured.  Stool samples may be cultured. TREATMENT  Women  Vaginal antifungal suppositories and creams.  Medicated creams to decrease irritation and itching on the outside of the vagina.  Warm compresses to the perineal area to decrease swelling and discomfort.  Oral antifungal medications.  Medicated vaginal suppositories or cream for repeated or recurrent infections.  Wash and dry the irritation areas before applying the cream.  Eating yogurt with Lactobacillus may help with prevention and treatment.  Sometimes painting the vagina with gentian violet solution may help if creams and suppositories do not work. Men  Antifungal creams and oral antifungal medications.  Sometimes treatment must continue for 30 days after the symptoms go away to prevent recurrence. HOME CARE INSTRUCTIONS  Women  Use cotton underwear and avoid tight-fitting clothing.  Avoid colored, scented toilet  paper and deodorant tampons or pads.  Do not douche.  Keep your diabetes under control.  Finish all the prescribed medications.  Keep your skin clean and dry.  Consume milk or yogurt with Lactobacillus-active culture regularly. If you get frequent yeast infections and think that is what the infection is, there are over-the-counter medications that you can get. If the infection does not show healing in 3 days, talk to your caregiver.  Tell your sex partner you have a yeast infection. Your partner may need treatment also, especially if your  infection does not clear up or recurs. Men  Keep your skin clean and dry.  Keep your diabetes under control.  Finish all prescribed medications.  Tell your sex partner that you have a yeast infection so he or she can be treated if necessary. SEEK MEDICAL CARE IF:   Your symptoms do not clear up or worsen in one week after treatment.  You have an oral temperature above 102 F (38.9 C).  You have trouble swallowing or eating for a prolonged time.  You develop blisters on and around your vagina.  You develop vaginal bleeding and it is not your menstrual period.  You develop abdominal pain.  You develop intestinal problems as mentioned above.  You get weak or light-headed.  You have painful or increased urination.  You have pain during sexual intercourse. MAKE SURE YOU:   Understand these instructions.  Will watch your condition.  Will get help right away if you are not doing well or get worse.   This information is not intended to replace advice given to you by your health care provider. Make sure you discuss any questions you have with your health care provider.   Document Released: 07/24/2004 Document Revised: 07/07/2014 Document Reviewed: 12/18/2014 Elsevier Interactive Patient Education [employer].

## 2015-07-05 NOTE — ED Notes (Signed)
The pt is c/o no pain now.  Pain earlier.  Alert oriented skin warm and dry  No distress.  Wife at the bedside

## 2015-07-06 LAB — URINE CULTURE

## 2015-07-09 LAB — CULTURE, BLOOD (ROUTINE X 2)
CULTURE: NO GROWTH
Culture: NO GROWTH

## 2015-08-03 ENCOUNTER — Other Ambulatory Visit (HOSPITAL_BASED_OUTPATIENT_CLINIC_OR_DEPARTMENT_OTHER): Payer: Medicare Other

## 2015-08-03 ENCOUNTER — Encounter: Payer: Self-pay | Admitting: Hematology

## 2015-08-03 ENCOUNTER — Ambulatory Visit (HOSPITAL_BASED_OUTPATIENT_CLINIC_OR_DEPARTMENT_OTHER): Payer: Medicare Other | Admitting: Hematology

## 2015-08-03 ENCOUNTER — Telehealth: Payer: Self-pay | Admitting: Hematology

## 2015-08-03 VITALS — BP 143/72 | HR 85 | Temp 97.9°F | Resp 18 | Ht 66.0 in | Wt 177.9 lb

## 2015-08-03 DIAGNOSIS — N189 Chronic kidney disease, unspecified: Secondary | ICD-10-CM

## 2015-08-03 DIAGNOSIS — E119 Type 2 diabetes mellitus without complications: Secondary | ICD-10-CM

## 2015-08-03 DIAGNOSIS — D472 Monoclonal gammopathy: Secondary | ICD-10-CM

## 2015-08-03 DIAGNOSIS — I10 Essential (primary) hypertension: Secondary | ICD-10-CM

## 2015-08-03 LAB — CBC WITH DIFFERENTIAL/PLATELET
BASO%: 0.2 % (ref 0.0–2.0)
Basophils Absolute: 0 10*3/uL (ref 0.0–0.1)
EOS%: 0.7 % (ref 0.0–7.0)
Eosinophils Absolute: 0.1 10*3/uL (ref 0.0–0.5)
HEMATOCRIT: 39.5 % (ref 38.4–49.9)
HEMOGLOBIN: 13.5 g/dL (ref 13.0–17.1)
LYMPH#: 3.2 10*3/uL (ref 0.9–3.3)
LYMPH%: 26.2 % (ref 14.0–49.0)
MCH: 30.9 pg (ref 27.2–33.4)
MCHC: 34.2 g/dL (ref 32.0–36.0)
MCV: 90.4 fL (ref 79.3–98.0)
MONO#: 1 10*3/uL — ABNORMAL HIGH (ref 0.1–0.9)
MONO%: 8.2 % (ref 0.0–14.0)
NEUT#: 7.8 10*3/uL — ABNORMAL HIGH (ref 1.5–6.5)
NEUT%: 64.7 % (ref 39.0–75.0)
Platelets: 180 10*3/uL (ref 140–400)
RBC: 4.37 10*6/uL (ref 4.20–5.82)
RDW: 14 % (ref 11.0–14.6)
WBC: 12 10*3/uL — AB (ref 4.0–10.3)

## 2015-08-03 LAB — COMPREHENSIVE METABOLIC PANEL
ALBUMIN: 3.4 g/dL — AB (ref 3.5–5.0)
ALT: 24 U/L (ref 0–55)
AST: 14 U/L (ref 5–34)
Alkaline Phosphatase: 56 U/L (ref 40–150)
Anion Gap: 10 mEq/L (ref 3–11)
BUN: 27.7 mg/dL — AB (ref 7.0–26.0)
CALCIUM: 9.1 mg/dL (ref 8.4–10.4)
CHLORIDE: 106 meq/L (ref 98–109)
CO2: 21 mEq/L — ABNORMAL LOW (ref 22–29)
CREATININE: 1.4 mg/dL — AB (ref 0.7–1.3)
EGFR: 56 mL/min/{1.73_m2} — ABNORMAL LOW (ref >90)
Glucose: 211 mg/dl — ABNORMAL HIGH (ref 70–140)
Potassium: 4.2 mEq/L (ref 3.5–5.1)
Sodium: 137 mEq/L (ref 136–145)
Total Bilirubin: 0.57 mg/dL (ref 0.20–1.20)
Total Protein: 7.3 g/dL (ref 6.4–8.3)

## 2015-08-03 NOTE — Telephone Encounter (Signed)
per pof to sch pt appt-gave pt copy of avs °

## 2015-08-03 NOTE — Progress Notes (Signed)
[name]  Telephone:([phone]) [phone] [phone]) [phone]  Clinic follow up Note   Patient Care Team: Bonita Quin, MD as PCP - General (Internal Medicine) Truitt Merle, MD as Consulting Physician (Hematology) Foye Spurling, MD as Consulting Physician (Internal Medicine) Elmarie Shiley, MD as Consulting Physician (Nephrology) 08/03/2015  CHIEF COMPLAINTS:  Follow up IgG MGUS   HISTORY OF PRESENTING ILLNESS (11/2014):  Dustin [name] 75 y.o. male is here because of abnormal SPEP.  He has long-standing history of diabetes, and chronic kidney disease. He was recently referred to a nephrologist Dr. Posey Pronto on 11/01/2014. As part of her workup, SPEP and light chain level was checked. 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 Serum free coprolite chain was elevated at 39, lambda light chain was normal, ratio was 2.62.  He feels well, no pain, except sometime left hip pain when he walks, he uses a crane, no other pain. He has good appetite and energy, he is not very active due to the left hip pain.  CURRENT THERAPY: Observation  INTERIM HISTORY  [name] returns for follow-up. He had pneumonia we'll months ago, was seen at the ED for twice, and received antibiotics treatment. He has recovered well overall. He still has mild fatigue. Her blood glucose has been high lately, and her endocrinologist changed him to insulin. He denies any significant bone pain, or other new symptoms.   MEDICAL HISTORY:  Past Medical History  Diagnosis Date  . Hypertension   . Chronic kidney disease   . Diabetes mellitus without complication ([place])   . PVD (peripheral vascular disease) ([place]) 2008    stent placement in legs   . A-fib [facility])     SURGICAL HISTORY: Past Surgical History  Procedure Laterality Date  . Coronary artery bypass graft  1992  . Coronary angioplasty with stent placement  2008    SOCIAL HISTORY: History   Social History  . Marital Status: Married   Spouse Name: N/A  . Number of Children: 3  . Years of Education: N/A   Occupational History  . Worked for [employer]   Social History Main Topics  . Smoking status: Never Smoker   . Smokeless tobacco: Not on file  . Alcohol Use: No  . Drug Use: No  . Sexual Activity: Not on file   Other Topics Concern  . Not on file   Social History Narrative  . No narrative on file    FAMILY HISTORY: Family History  Problem Relation Age of Onset  . Heart failure Mother   . Heart failure Father   . Diabetes Sister   . Congenital heart disease Sister   . Cancer Brother 63    prostate and colon cancer     ALLERGIES:  has No Known Allergies.  MEDICATIONS:  Current Outpatient Prescriptions  Medication Sig Dispense Refill  . atorvastatin (LIPITOR) 40 MG tablet Take 20 mg by mouth daily.    . Calcium Citrate 150 MG CAPS Take 300 mg by mouth daily.    . Cholecalciferol (VITAMIN D3) 5000 UNITS CAPS Take 5,000 Units by mouth daily.    . clotrimazole (LOTRIMIN) 1 % cream Apply to affected area 2 times daily 15 g 0  . co-enzyme Q-10 30 MG capsule Take 100 mg by mouth daily.    . Cyanocobalamin (B-12) 5000 MCG CAPS Take 1 capsule by mouth daily.    Marland Kitchen glimepiride (AMARYL) 2 MG tablet Take 4 mg by mouth 2 (two) times daily.     Marland Kitchen  linagliptin (TRADJENTA) 5 MG TABS tablet Take 5 mg by mouth daily.    Marland Kitchen lisinopril (PRINIVIL,ZESTRIL) 20 MG tablet Take 20 mg by mouth 2 (two) times daily.    . Magnesium 250 MG TABS Take 2 tablets by mouth daily.    . metoprolol (LOPRESSOR) 50 MG tablet Take 50 mg by mouth 2 (two) times daily.    . NON FORMULARY Take 12.5 mg by mouth daily. Iodine complex    . NON FORMULARY Take 5 mg by mouth daily. dehydroepiandrosterone    . NON FORMULARY Take 5,000 mcg by mouth daily. Folate    . nystatin-triamcinolone (MYCOLOG II) cream Apply 1 application topically daily as needed. For rash  3  . pyridOXINE (VITAMIN B-6) 50 MG tablet Take 50 mg by mouth daily.    .  rivaroxaban (XARELTO) 20 MG TABS tablet Take 20 mg by mouth daily with supper.    . Saw Palmetto, Serenoa repens, 320 MG CAPS Take 1 capsule by mouth daily.    . tamsulosin (FLOMAX) 0.4 MG CAPS capsule Take 0.8 mg by mouth at bedtime.     . timolol (TIMOPTIC) 0.5 % ophthalmic solution Place 1 drop into both eyes daily.    . Travoprost, BAK Free, (TRAVATAN) 0.004 % SOLN ophthalmic solution Place 1 drop into both eyes daily.    . Vitamins A & D 5000-400 UNITS CAPS Take 1 capsule by mouth daily.    . furosemide (LASIX) 20 MG tablet Take 20 mg by mouth as needed. Reported on 08/03/2015  0  . nitroGLYCERIN (NITROSTAT) 0.4 MG SL tablet Place 0.4 mg under the tongue every 5 (five) minutes as needed for chest pain. Reported on 08/03/2015    . sildenafil (VIAGRA) 50 MG tablet Take 25 mg by mouth daily as needed for erectile dysfunction. Reported on 08/03/2015     No current facility-administered medications for this visit.    REVIEW OF SYSTEMS:   Constitutional: Denies fevers, chills or abnormal night sweats Eyes: Denies blurriness of vision, double vision or watery eyes Ears, nose, mouth, throat, and face: Denies mucositis or sore throat Respiratory: Denies cough, dyspnea or wheezes Cardiovascular: Denies palpitation, chest discomfort or lower extremity swelling Gastrointestinal:  Denies nausea, heartburn or change in bowel habits Skin: Denies abnormal skin rashes Lymphatics: Denies new lymphadenopathy or easy bruising Neurological:Denies numbness, tingling or new weaknesses Behavioral/Psych: Mood is stable, no new changes  All other systems were reviewed with the patient and are negative.  PHYSICAL EXAMINATION: ECOG PERFORMANCE STATUS: 1  Filed Vitals:   08/03/15 1043  BP: 143/72  Pulse: 85  Temp: 97.9 F (36.6 C)  Resp: 18   Filed Weights   08/03/15 1043  Weight: 177 lb 14.4 oz (80.695 kg)    GENERAL:alert, no distress and comfortable SKIN: skin color, texture, turgor are normal, no  rashes or significant lesions EYES: normal, conjunctiva are pink and non-injected, sclera clear OROPHARYNX:no exudate, no erythema and lips, buccal mucosa, and tongue normal  NECK: supple, thyroid normal size, non-tender, without nodularity LYMPH:  no palpable lymphadenopathy in the cervical, axillary or inguinal LUNGS: clear to auscultation and percussion with normal breathing effort HEART: regular rate & rhythm and no murmurs and no lower extremity edema ABDOMEN:abdomen soft, non-tender and normal bowel sounds Musculoskeletal:no cyanosis of digits and no clubbing  PSYCH: alert & oriented x 3 with fluent speech NEURO: no focal motor/sensory deficits  LABORATORY DATA:  I have reviewed the data as listed CBC Latest Ref Rng 08/03/2015 07/04/2015 05/04/2015  WBC 4.0 - 10.3 10e3/uL 12.0(H) 7.3 7.4  Hemoglobin 13.0 - 17.1 g/dL 13.5 13.6 15.0  Hematocrit 38.4 - 49.9 % 39.5 41.0 42.8  Platelets 140 - 400 10e3/uL 180 137(L) 157    Recent Labs  05/04/15 1211 07/04/15 2006 08/03/15 1026  NA 138 131* 137  K 4.2 4.1 4.2  CL  --  104  --   CO2 23 17* 21*  GLUCOSE 205* 120* 211*  BUN 23.9 24* 27.7*  CREATININE 1.6* 1.29* 1.4*  CALCIUM 9.6 8.8* 9.1  GFRNONAA  --  53*  --   GFRAA  --  >60  --   PROT 7.9 8.3* 7.3  ALBUMIN 4.1 3.4* 3.4*  AST 19 42* 14  ALT 26 32 24  ALKPHOS 67 56 56  BILITOT 0.77 0.8 0.57   MGUS LAB: M-protein  12/05/2014: 0.6 05/04/15: 0.6  IgG 12/05/14 1880    KAPPA, LAMBDA light chains and rat 12/05/2014 and are : 2.76, 1.56, 1.77 05/04/2015: 4.14, 1.69, 2.45  24 hour UPEP/IFE 12/08/2014 Comments: No monoclonal free light chains (Bence Jones Protein) are detected.Urine IFE shows polyclonal increase in free Kappa and/or free Lambdalight chains.  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 - SEE COMMENT. Diagnosis Note The bone marrow is normocellular, however, there is a  mild increase in plasma cells (9% by aspirate, 5-10% by CD138). Light chain immunohistochemistry reveals an apparent kappa excess. 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 Correlation with clinical, laboratory, and radiographic data is required for further classification.  RADIOGRAPHIC STUDIES: I have personally reviewed the radiological images as listed and agreed with the findings in the report.  Dg Bone Survey Met  12/12/2014   CLINICAL DATA:  Monoclonal gammopathy of uncertain significance. Question multiple myeloma.  EXAM: METASTATIC BONE SURVEY  COMPARISON:  None.  FINDINGS: A single lucent lesion on the lateral view of the skull measuring 0.7 cm is identified. 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 No other lytic lesion is seen. The patient has severe degenerative disease about the hips. 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 The patient is status post CABG. The lungs are clear. Scattered mild appearing spondylosis is seen about the spine. Bilateral iliac stents are in place.  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 No convincing evidence of multiple myeloma is identified.  Severe degenerative disease about the hips.   Electronically Signed   By: Inge Rise M.D.   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 He is asymptomatic except chronic left hip pain from arthritis. -I reviewed his bone marrow biopsy results, which showed 5-10% of plasma cell. -I reviewed his lab results. SPEP showed monoclonal IgG gammopathy, with low M protein level (0.5-0.6g/dl), serum free Kappa light chain is slightly elevated, light chain ratio was normal. -His  initial lab showed normal CBC, CMP showed a normal calcium level, creatinine 1.44. He does have long-standing history of HTN and diabetes, which can cause chronic kidney disease. -A bone survey was negative for lytic lesions.  -Based on the above test results, he has IgG MGUS. No evidence of multiple myeloma. -I reviewed the natural  history of MGUS, and the risk of progressing to multiple myeloma (1% per year). I recommend surveillance with labs and office visit every 3-6 months. No treatment is needed. -I reviewed his lab results today, his CBC normal, CMP showed stable creatinine 1.4, calcium level was normal. His SPEP and light chain levels are still pending. I'll call him about these results next week.  -His clinical doing well and stable, we'll continue monitoring. If his M protein is stable, will follow up every 6 month   2. HTN, DM, CKD -He will continue follow-up with his primary care physician, endocrinologist and nephrologist. -I discussed his Cr result, and will copy Dr. Posey Pronto   Plan -Return to clinic in 6 months with lab and exam on same day.  -will call him next week about the rest of his lab result   All questions were answered. The patient knows to call the clinic with any problems, questions or concerns.  I spent 15 minutes counseling the patient face to face. The total time spent in the appointment was 20 minutes and more than 50% was on counseling.     Truitt Merle, MD 08/03/2015 11:40 AM

## 2015-08-06 ENCOUNTER — Telehealth: Payer: Self-pay | Admitting: Hematology

## 2015-08-06 LAB — MULTIPLE MYELOMA PANEL, SERUM
ALBUMIN SERPL ELPH-MCNC: 3.2 g/dL (ref 2.9–4.4)
Albumin/Glob SerPl: 1 (ref 0.7–1.7)
Alpha 1: 0.2 g/dL (ref 0.0–0.4)
Alpha2 Glob SerPl Elph-Mcnc: 0.6 g/dL (ref 0.4–1.0)
B-Globulin SerPl Elph-Mcnc: 1.1 g/dL (ref 0.7–1.3)
GAMMA GLOB SERPL ELPH-MCNC: 1.4 g/dL (ref 0.4–1.8)
GLOBULIN, TOTAL: 3.4 g/dL (ref 2.2–3.9)
IGA/IMMUNOGLOBULIN A, SERUM: 279 mg/dL (ref 61–437)
IGM (IMMUNOGLOBIN M), SRM: 49 mg/dL (ref 15–143)
IgG, Qn, Serum: 1625 mg/dL — ABNORMAL HIGH (ref 700–1600)
M Protein SerPl Elph-Mcnc: 0.6 g/dL — ABNORMAL HIGH
TOTAL PROTEIN: 6.6 g/dL (ref 6.0–8.5)

## 2015-08-06 LAB — KAPPA/LAMBDA LIGHT CHAINS
IG KAPPA FREE LIGHT CHAIN: 25.39 mg/L — AB (ref 3.30–19.40)
IG LAMBDA FREE LIGHT CHAIN: 12.47 mg/L (ref 5.71–26.30)
KAPPA/LAMBDA FLC RATIO: 2.04 — AB (ref 0.26–1.65)

## 2015-08-06 NOTE — Telephone Encounter (Signed)
I called patient and spoke with his wife regarding his lab test result from last week. His M protein is stable, 0.6, will  continue observation. She voiced good understanding and agreed with the plan.  Truitt Merle  08/06/2015

## 2015-09-24 DIAGNOSIS — N183 Chronic kidney disease, stage 3 unspecified: Secondary | ICD-10-CM | POA: Insufficient documentation

## 2015-11-29 DIAGNOSIS — Z8679 Personal history of other diseases of the circulatory system: Secondary | ICD-10-CM | POA: Insufficient documentation

## 2016-01-22 ENCOUNTER — Telehealth: Payer: Self-pay | Admitting: Hematology

## 2016-01-22 NOTE — Telephone Encounter (Signed)
Appointment confirmed with patient. Dustin [name] °

## 2016-01-22 NOTE — Telephone Encounter (Signed)
Appointment schedule and letter mailed per patient request. Dustin [name].

## 2016-01-31 ENCOUNTER — Other Ambulatory Visit: Payer: Medicare Other

## 2016-01-31 ENCOUNTER — Ambulatory Visit: Payer: Medicare Other | Admitting: Hematology

## 2016-02-01 ENCOUNTER — Telehealth: Payer: Self-pay | Admitting: Hematology

## 2016-02-01 ENCOUNTER — Telehealth: Payer: Self-pay | Admitting: *Deleted

## 2016-02-01 ENCOUNTER — Other Ambulatory Visit: Payer: Self-pay | Admitting: *Deleted

## 2016-02-01 NOTE — Telephone Encounter (Signed)
cld & spoke to pt and gave pt r/s time & date per pt req

## 2016-02-01 NOTE — Telephone Encounter (Signed)
Received call from pt asking if he can come 02/04/16 for appt with Dr Burr Medico instead of 02/12/16 because he has an appt with his urologist on Monday & comes from [place], [place].  If unable to come in Monday, he would like to make an appt for another date-not on a Tuesday.  Message to Dr  Burr Medico upon her arrival.  Explained to pt that Dr Burr Medico is out of the office & will be back Monday but probably will not be able to get him in on Monday.  His appt with the urologist is @ 12 noon.  Please call him @ mobile # to change appt [phone].

## 2016-02-12 ENCOUNTER — Other Ambulatory Visit: Payer: Medicare Other

## 2016-02-12 ENCOUNTER — Ambulatory Visit: Payer: Medicare Other | Admitting: Hematology

## 2016-02-20 ENCOUNTER — Other Ambulatory Visit (HOSPITAL_BASED_OUTPATIENT_CLINIC_OR_DEPARTMENT_OTHER): Payer: Medicare Other

## 2016-02-20 ENCOUNTER — Ambulatory Visit (HOSPITAL_BASED_OUTPATIENT_CLINIC_OR_DEPARTMENT_OTHER): Payer: Medicare Other | Admitting: Hematology

## 2016-02-20 ENCOUNTER — Telehealth: Payer: Self-pay | Admitting: Hematology

## 2016-02-20 ENCOUNTER — Encounter: Payer: Self-pay | Admitting: Hematology

## 2016-02-20 VITALS — BP 158/83 | HR 55 | Temp 98.4°F | Resp 17 | Ht 66.0 in | Wt 187.4 lb

## 2016-02-20 DIAGNOSIS — M1612 Unilateral primary osteoarthritis, left hip: Secondary | ICD-10-CM

## 2016-02-20 DIAGNOSIS — I10 Essential (primary) hypertension: Secondary | ICD-10-CM | POA: Diagnosis not present

## 2016-02-20 DIAGNOSIS — D472 Monoclonal gammopathy: Secondary | ICD-10-CM

## 2016-02-20 DIAGNOSIS — E119 Type 2 diabetes mellitus without complications: Secondary | ICD-10-CM | POA: Diagnosis not present

## 2016-02-20 DIAGNOSIS — N189 Chronic kidney disease, unspecified: Secondary | ICD-10-CM

## 2016-02-20 LAB — COMPREHENSIVE METABOLIC PANEL
ALT: 22 U/L (ref 0–55)
AST: 22 U/L (ref 5–34)
Albumin: 3.6 g/dL (ref 3.5–5.0)
Alkaline Phosphatase: 63 U/L (ref 40–150)
Anion Gap: 10 mEq/L (ref 3–11)
BUN: 18.5 mg/dL (ref 7.0–26.0)
CALCIUM: 9.3 mg/dL (ref 8.4–10.4)
CHLORIDE: 108 meq/L (ref 98–109)
CO2: 20 meq/L — AB (ref 22–29)
CREATININE: 1.3 mg/dL (ref 0.7–1.3)
EGFR: 61 mL/min/{1.73_m2} — ABNORMAL LOW (ref >90)
Glucose: 145 mg/dl — ABNORMAL HIGH (ref 70–140)
POTASSIUM: 4 meq/L (ref 3.5–5.1)
SODIUM: 138 meq/L (ref 136–145)
Total Bilirubin: 0.62 mg/dL (ref 0.20–1.20)
Total Protein: 7.8 g/dL (ref 6.4–8.3)

## 2016-02-20 LAB — CBC WITH DIFFERENTIAL/PLATELET
BASO%: 0.4 % (ref 0.0–2.0)
BASOS ABS: 0 10*3/uL (ref 0.0–0.1)
EOS%: 1.3 % (ref 0.0–7.0)
Eosinophils Absolute: 0.1 10*3/uL (ref 0.0–0.5)
HCT: 37.4 % — ABNORMAL LOW (ref 38.4–49.9)
HGB: 13 g/dL (ref 13.0–17.1)
LYMPH#: 2.2 10*3/uL (ref 0.9–3.3)
LYMPH%: 29.3 % (ref 14.0–49.0)
MCH: 31.1 pg (ref 27.2–33.4)
MCHC: 34.8 g/dL (ref 32.0–36.0)
MCV: 89.5 fL (ref 79.3–98.0)
MONO#: 0.7 10*3/uL (ref 0.1–0.9)
MONO%: 9.1 % (ref 0.0–14.0)
NEUT#: 4.5 10*3/uL (ref 1.5–6.5)
NEUT%: 59.9 % (ref 39.0–75.0)
Platelets: 150 10*3/uL (ref 140–400)
RBC: 4.18 10*6/uL — AB (ref 4.20–5.82)
RDW: 13.6 % (ref 11.0–14.6)
WBC: 7.6 10*3/uL (ref 4.0–10.3)

## 2016-02-20 NOTE — Progress Notes (Signed)
[place] [facility]  [phone] [phone]  Clinic follow up Note   Patient Care Team: Bonita Quin, MD as PCP - General (Internal Medicine) Truitt Merle, MD as Consulting Physician (Hematology) Foye Spurling, MD as Consulting Physician (Internal Medicine) Elmarie Shiley, MD as Consulting Physician (Nephrology) 02/20/2016  CHIEF COMPLAINTS:  Follow up IgG MGUS   HISTORY OF PRESENTING ILLNESS (11/2014):  Dustin [name] 75 y.o. male is here because of abnormal SPEP.  He has long-standing history of diabetes, and chronic kidney disease. He was recently referred to a nephrologist Dr. Posey Pronto on 11/01/2014. As part of her workup, SPEP and light chain level was checked. 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 Serum free coprolite chain was elevated at 39, lambda light chain was normal, ratio was 2.62.  He feels well, no pain, except sometime left hip pain when he walks, he uses a crane, no other pain. He has good appetite and energy, he is not very active due to the left hip pain.  CURRENT THERAPY: Observation  INTERIM HISTORY  Dustin [name] returns for follow-up. He is accompanied by his wife to the clinic today. He is doing well overall. His main complaints to his left hip pain, he uses a cane, able to walk around, but not able to golf anymore. He has been seen orthopedics, has not decide if he wants total hip replacement. No other new pain or other symptoms.  MEDICAL HISTORY:  Past Medical History:  Diagnosis Date  . A-fib ([place])   . Chronic kidney disease   . Diabetes mellitus without complication ([place])   . Hypertension   . PVD (peripheral vascular disease) ([place]) 2008   stent placement in legs     SURGICAL HISTORY: Past Surgical History:  Procedure Laterality Date  . CORONARY ANGIOPLASTY WITH STENT PLACEMENT  2008  . CORONARY ARTERY BYPASS GRAFT  1992    SOCIAL HISTORY: History   Social History  . Marital Status: Married    Spouse  Name: N/A  . Number of Children: 3  . Years of Education: N/A   Occupational History  . Worked for [employer] representative   Social History Main Topics  . Smoking status: Never Smoker   . Smokeless tobacco: Not on file  . Alcohol Use: No  . Drug Use: No  . Sexual Activity: Not on file   Other Topics Concern  . Not on file   Social History Narrative  . No narrative on file    FAMILY HISTORY: Family History  Problem Relation Age of Onset  . Heart failure Mother   . Heart failure Father   . Diabetes Sister   . Congenital heart disease Sister   . Cancer Brother 82    prostate and colon cancer     ALLERGIES:  has No Known Allergies.  MEDICATIONS:  Current Outpatient Prescriptions  Medication Sig Dispense Refill  . atorvastatin (LIPITOR) 40 MG tablet Take 20 mg by mouth daily.    . Calcium Citrate 150 MG CAPS Take 300 mg by mouth daily.    . Cholecalciferol (VITAMIN D3) 5000 UNITS CAPS Take 5,000 Units by mouth daily.    . clotrimazole (LOTRIMIN) 1 % cream Apply to affected area 2 times daily 15 g 0  . co-enzyme Q-10 30 MG capsule Take 100 mg by mouth daily.    . Cyanocobalamin (B-12) 5000 MCG CAPS Take 1 capsule by mouth daily.    . furosemide (LASIX) 20 MG tablet Take 20 mg by  mouth as needed. Reported on 08/03/2015  0  . glimepiride (AMARYL) 2 MG tablet Take 4 mg by mouth 2 (two) times daily.     Marland Kitchen linagliptin (TRADJENTA) 5 MG TABS tablet Take 5 mg by mouth daily.    Marland Kitchen lisinopril (PRINIVIL,ZESTRIL) 20 MG tablet Take 20 mg by mouth 2 (two) times daily.    . Magnesium 250 MG TABS Take 2 tablets by mouth daily.    . metoprolol (LOPRESSOR) 50 MG tablet Take 50 mg by mouth 2 (two) times daily.    . nitroGLYCERIN (NITROSTAT) 0.4 MG SL tablet Place 0.4 mg under the tongue every 5 (five) minutes as needed for chest pain. Reported on 08/03/2015    . NON FORMULARY Take 12.5 mg by mouth daily. Iodine complex    . NON FORMULARY Take 5 mg by mouth daily. dehydroepiandrosterone    . NON  FORMULARY Take 5,000 mcg by mouth daily. Folate    . pyridOXINE (VITAMIN B-6) 50 MG tablet Take 50 mg by mouth daily.    . rivaroxaban (XARELTO) 20 MG TABS tablet Take 20 mg by mouth daily with supper.    . timolol (TIMOPTIC) 0.5 % ophthalmic solution Place 1 drop into both eyes daily.    . Travoprost, BAK Free, (TRAVATAN) 0.004 % SOLN ophthalmic solution Place 1 drop into both eyes daily.    . Vitamins A & D 5000-400 UNITS CAPS Take 1 capsule by mouth daily.     No current facility-administered medications for this visit.     REVIEW OF SYSTEMS:   Constitutional: Denies fevers, chills or abnormal night sweats Eyes: Denies blurriness of vision, double vision or watery eyes Ears, nose, mouth, throat, and face: Denies mucositis or sore throat Respiratory: Denies cough, dyspnea or wheezes Cardiovascular: Denies palpitation, chest discomfort or lower extremity swelling Gastrointestinal:  Denies nausea, heartburn or change in bowel habits Skin: Denies abnormal skin rashes Lymphatics: Denies new lymphadenopathy or easy bruising Neurological:Denies numbness, tingling or new weaknesses Behavioral/Psych: Mood is stable, no new changes  All other systems were reviewed with the patient and are negative.  PHYSICAL EXAMINATION: ECOG PERFORMANCE STATUS: 1  Vitals:   02/20/16 0951  BP: (!) 158/83  Pulse: (!) 55  Resp: 17  Temp: 98.4 F (36.9 C)   Filed Weights   02/20/16 0951  Weight: 187 lb 6.4 oz (85 kg)    GENERAL:alert, no distress and comfortable SKIN: skin color, texture, turgor are normal, no rashes or significant lesions EYES: normal, conjunctiva are pink and non-injected, sclera clear OROPHARYNX:no exudate, no erythema and lips, buccal mucosa, and tongue normal  NECK: supple, thyroid normal size, non-tender, without nodularity LYMPH:  no palpable lymphadenopathy in the cervical, axillary or inguinal LUNGS: clear to auscultation and percussion with normal breathing effort HEART:  regular rate & rhythm and no murmurs and no lower extremity edema ABDOMEN:abdomen soft, non-tender and normal bowel sounds Musculoskeletal:no cyanosis of digits and no clubbing  PSYCH: alert & oriented x 3 with fluent speech NEURO: no focal motor/sensory deficits  LABORATORY DATA:  I have reviewed the data as listed CBC Latest Ref Rng & Units 02/20/2016 08/03/2015 07/04/2015  WBC 4.0 - 10.3 10e3/uL 7.6 12.0(H) 7.3  Hemoglobin 13.0 - 17.1 g/dL 13.0 13.5 13.6  Hematocrit 38.4 - 49.9 % 37.4(L) 39.5 41.0  Platelets 140 - 400 10e3/uL 150 180 137(L)    Recent Labs  07/04/15 2006 08/03/15 1026 08/03/15 1026 02/20/16 0926  NA 131* 137  --  138  K 4.1 4.2  --  4.0  CL 104  --   --   --   CO2 17* 21*  --  20*  GLUCOSE 120* 211*  --  145*  BUN 24* 27.7*  --  18.5  CREATININE 1.29* 1.4*  --  1.3  CALCIUM 8.8* 9.1  --  9.3  GFRNONAA 53*  --   --   --   GFRAA >60  --   --   --   PROT 8.3* 7.3 6.6 7.8  ALBUMIN 3.4* 3.4*  --  3.6  AST 42* 14  --  22  ALT 32 24  --  22  ALKPHOS 56 56  --  63  BILITOT 0.8 0.57  --  0.62   MGUS LAB: M-protein  12/05/2014: 0.6 05/04/15: 0.6 08/03/2015: 0.6  IgG 12/05/14 1880 08/03/2015: 1625    KAPPA, LAMBDA light chains and rat 12/05/2014 and are : 2.76, 1.56, 1.77 05/04/2015: 4.14, 1.69, 2.45 08/03/2015: 2.54, 1.25, 2.04  24 hour UPEP/IFE 12/08/2014 Comments: No monoclonal free light chains (Bence Jones Protein) are detected.Urine IFE shows polyclonal increase in free Kappa and/or free Lambdalight chains.  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 - SEE COMMENT. Diagnosis Note The bone marrow is normocellular, however, there is a mild increase in plasma cells (9% by aspirate, 5-10% by CD138). Light chain immunohistochemistry reveals an apparent kappa excess. 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 Correlation with clinical, laboratory, and  radiographic data is required for further classification.  RADIOGRAPHIC STUDIES: I have personally reviewed the radiological images as listed and agreed with the findings in the report.  Dg Bone Survey Met  12/12/2014   CLINICAL DATA:  Monoclonal gammopathy of uncertain significance. Question multiple myeloma.  EXAM: METASTATIC BONE SURVEY  COMPARISON:  None.  FINDINGS: A single lucent lesion on the lateral view of the skull measuring 0.7 cm is identified. 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 No other lytic lesion is seen. The patient has severe degenerative disease about the hips. 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 The patient is status post CABG. The lungs are clear. Scattered mild appearing spondylosis is seen about the spine. Bilateral iliac stents are in place.  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 No convincing evidence of multiple myeloma is identified.  Severe degenerative disease about the hips.   Electronically Signed   By: Inge Rise M.D.   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 He is asymptomatic except chronic left hip pain from arthritis. -I reviewed his bone marrow biopsy results, which showed 5-10% of plasma cell. -I reviewed his lab results. SPEP showed monoclonal IgG gammopathy, with low M protein level (0.5-0.6g/dl), serum free Kappa light chain is slightly elevated, light chain ratio was normal. -His initial lab showed normal CBC, CMP showed a normal calcium level, creatinine 1.44. He does have long-standing history of HTN and diabetes, which can cause chronic kidney disease. -A bone survey was negative for lytic lesions.  -Based on  the above test results, he has IgG MGUS. No evidence of multiple myeloma. -I reviewed the natural history of MGUS, and the risk of progressing  to multiple myeloma (1% per year). He scan protein has been remained the same since his diagnosis, his risk is probably low. I recommend surveillance with labs and office visit every 6 months. No treatment is needed. -I reviewed his lab results today, his CBC normal, CMP showed stable creatinine 1.3, calcium level was normal. His SPEP and light chain levels are still pending. It was 0.66 months ago, overall stable. I'll call him about these results next week.  -His clinical doing well and stable, we'll continue monitoring.    2. HTN, DM, CKD -He will continue follow-up with his primary care physician, endocrinologist and nephrologist. -I discussed his Cr result, and will copy Dr. Posey Pronto   3. Left hip arthritis -He will continue follow-up with his orthopedic surgeon, he has not decided if he wants total hip replacement.   Plan -Return to clinic in 6 months with lab and exam on same day.  -will call him next week about the rest of his lab result   All questions were answered. The patient knows to call the clinic with any problems, questions or concerns.  I spent 15 minutes counseling the patient face to face. The total time spent in the appointment was 20 minutes and more than 50% was on counseling.     Truitt Merle, MD 02/20/2016 10:17 AM

## 2016-02-20 NOTE — Telephone Encounter (Signed)
GAVE PATIENT AVS REPORT AND APPOINTMENTS FOR February. °

## 2016-02-21 LAB — KAPPA/LAMBDA LIGHT CHAINS
IG KAPPA FREE LIGHT CHAIN: 34.8 mg/L — AB (ref 3.3–19.4)
IG LAMBDA FREE LIGHT CHAIN: 20.2 mg/L (ref 5.7–26.3)
Kappa/Lambda FluidC Ratio: 1.72 — ABNORMAL HIGH (ref 0.26–1.65)

## 2016-02-25 LAB — MULTIPLE MYELOMA PANEL, SERUM
ALBUMIN SERPL ELPH-MCNC: 3.8 g/dL (ref 2.9–4.4)
ALPHA 1: 0.2 g/dL (ref 0.0–0.4)
ALPHA2 GLOB SERPL ELPH-MCNC: 0.6 g/dL (ref 0.4–1.0)
Albumin/Glob SerPl: 1.1 (ref 0.7–1.7)
B-GLOBULIN SERPL ELPH-MCNC: 1.1 g/dL (ref 0.7–1.3)
GAMMA GLOB SERPL ELPH-MCNC: 1.8 g/dL (ref 0.4–1.8)
GLOBULIN, TOTAL: 3.7 g/dL (ref 2.2–3.9)
IgA, Qn, Serum: 268 mg/dL (ref 61–437)
IgM, Qn, Serum: 46 mg/dL (ref 15–143)
M PROTEIN SERPL ELPH-MCNC: 0.6 g/dL — AB
Total Protein: 7.5 g/dL (ref 6.0–8.5)

## 2016-08-18 ENCOUNTER — Telehealth: Payer: Self-pay | Admitting: Hematology

## 2016-08-18 NOTE — Telephone Encounter (Signed)
Patient wife called to move 2/21 lab/fu ti 3/15. Wife has new date/time.

## 2016-08-20 ENCOUNTER — Other Ambulatory Visit: Payer: Medicare Other

## 2016-08-20 ENCOUNTER — Ambulatory Visit: Payer: Medicare Other | Admitting: Hematology

## 2016-09-09 NOTE — Progress Notes (Signed)
[place]  [phone] Fax:([phone]) [phone]  Clinic follow up Note   Patient Care Team: Bonita Quin, MD as PCP - General (Internal Medicine) Truitt Merle, MD as Consulting Physician (Hematology) Foye Spurling, MD as Consulting Physician (Internal Medicine) Elmarie Shiley, MD as Consulting Physician (Nephrology) 09/11/2016  CHIEF COMPLAINTS:  Follow up IgG MGUS   HISTORY OF PRESENTING ILLNESS (11/2014):  Dustin [name] 76 y.o. male is here because of abnormal SPEP.  He has long-standing history of diabetes, and chronic kidney disease. He was recently referred to a nephrologist Dr. Posey Pronto on 11/01/2014. As part of her workup, SPEP and light chain level was checked. 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 Serum free coprolite chain was elevated at 39, lambda light chain was normal, ratio was 2.62.  He feels well, no pain, except sometime left hip pain when he walks, he uses a crane, no other pain. He has good appetite and energy, he is not very active due to the left hip pain.  CURRENT THERAPY: Observation  INTERIM HISTORY  Aldo returns for follow-up. He is doing well overall. His main complains is bilateral hip pain, left worse than right, which has limited his activities. He walks with a cane. He has not decided about his hip surgery. His chronic back pain is stable. No other new pain or other symptoms.   MEDICAL HISTORY:  Past Medical History:  Diagnosis Date  . A-fib ([place])   . Chronic kidney disease   . Diabetes mellitus without complication ([place])   . Hypertension   . PVD (peripheral vascular disease) ([place]) 2008   stent placement in legs     SURGICAL HISTORY: Past Surgical History:  Procedure Laterality Date  . CORONARY ANGIOPLASTY WITH STENT PLACEMENT  2008  . CORONARY ARTERY BYPASS GRAFT  1992    SOCIAL HISTORY: History   Social History  . Marital Status: Married    Spouse Name: N/A  . Number of Children: 3  .  Years of Education: N/A   Occupational History  . Worked for [employer] representative   Social History Main Topics  . Smoking status: Never Smoker   . Smokeless tobacco: Not on file  . Alcohol Use: No  . Drug Use: No  . Sexual Activity: Not on file   Other Topics Concern  . Not on file   Social History Narrative  . No narrative on file    FAMILY HISTORY: Family History  Problem Relation Age of Onset  . Heart failure Mother   . Heart failure Father   . Diabetes Sister   . Congenital heart disease Sister   . Cancer Brother 16    prostate and colon cancer     ALLERGIES:  has No Known Allergies.  MEDICATIONS:  Current Outpatient Prescriptions  Medication Sig Dispense Refill  . Calcium Citrate 150 MG CAPS Take 300 mg by mouth daily.    . Cholecalciferol (VITAMIN D3) 5000 UNITS CAPS Take 5,000 Units by mouth daily.    Marland Kitchen co-enzyme Q-10 30 MG capsule Take 100 mg by mouth daily.    . Cyanocobalamin (B-12) 5000 MCG CAPS Take 1 capsule by mouth daily.    . furosemide (LASIX) 20 MG tablet Take 20 mg by mouth as needed. Reported on 08/03/2015  0  . Insulin Glargine (TOUJEO SOLOSTAR) 300 UNIT/ML SOPN Inject 45 Units into the skin daily.    Marland Kitchen linagliptin (TRADJENTA) 5 MG TABS tablet Take 5 mg by mouth daily.    Marland Kitchen  lisinopril (PRINIVIL,ZESTRIL) 20 MG tablet Take 20 mg by mouth 2 (two) times daily.    . Magnesium 250 MG TABS Take 2 tablets by mouth daily.    . metoprolol (LOPRESSOR) 50 MG tablet Take 50 mg by mouth 2 (two) times daily.    . NON FORMULARY Take 12.5 mg by mouth daily. Iodine complex    . NON FORMULARY Take 5 mg by mouth daily. dehydroepiandrosterone    . NON FORMULARY Take 5,000 mcg by mouth daily. Folate    . pravastatin (PRAVACHOL) 20 MG tablet Take 1 tablet by mouth at bedtime.  2  . pyridOXINE (VITAMIN B-6) 50 MG tablet Take 50 mg by mouth daily.    . rivaroxaban (XARELTO) 20 MG TABS tablet Take 20 mg by mouth daily with supper.    . timolol (TIMOPTIC) 0.5 % ophthalmic  solution Place 1 drop into both eyes daily.    . Travoprost, BAK Free, (TRAVATAN) 0.004 % SOLN ophthalmic solution Place 1 drop into both eyes daily.    . Vitamins A & D 5000-400 UNITS CAPS Take 1 capsule by mouth daily.    . nitroGLYCERIN (NITROSTAT) 0.4 MG SL tablet Place 0.4 mg under the tongue every 5 (five) minutes as needed for chest pain. Reported on 08/03/2015     No current facility-administered medications for this visit.     REVIEW OF SYSTEMS:   Constitutional: Denies fevers, chills or abnormal night sweats Eyes: Denies blurriness of vision, double vision or watery eyes Ears, nose, mouth, throat, and face: Denies mucositis or sore throat Respiratory: Denies cough, dyspnea or wheezes Cardiovascular: Denies palpitation, chest discomfort or lower extremity swelling Gastrointestinal:  Denies nausea, heartburn or change in bowel habits Skin: Denies abnormal skin rashes Lymphatics: Denies new lymphadenopathy or easy bruising Neurological:Denies numbness, tingling or new weaknesses, (+) back and bilateral hip pain Behavioral/Psych: Mood is stable, no new changes  All other systems were reviewed with the patient and are negative.  PHYSICAL EXAMINATION:  ECOG PERFORMANCE STATUS: 1  Vitals:   09/11/16 1326  BP: (!) 169/72  Pulse: (!) 53  Resp: 18  Temp: 98.5 F (36.9 C)   Filed Weights   09/11/16 1326  Weight: 163 lb 3.2 oz (74 kg)    GENERAL:alert, no distress and comfortable SKIN: skin color, texture, turgor are normal, no rashes or significant lesions EYES: normal, conjunctiva are pink and non-injected, sclera clear OROPHARYNX:no exudate, no erythema and lips, buccal mucosa, and tongue normal  NECK: supple, thyroid normal size, non-tender, without nodularity LYMPH:  no palpable lymphadenopathy in the cervical, axillary or inguinal LUNGS: clear to auscultation and percussion with normal breathing effort HEART: regular rate & rhythm and no murmurs and no lower extremity  edema ABDOMEN:abdomen soft, non-tender and normal bowel sounds Musculoskeletal:no cyanosis of digits and no clubbing  PSYCH: alert & oriented x 3 with fluent speech NEURO: no focal motor/sensory deficits  LABORATORY DATA:  I have reviewed the data as listed CBC Latest Ref Rng & Units 09/11/2016 02/20/2016 08/03/2015  WBC 4.0 - 10.3 10e3/uL 7.9 7.6 12.0(H)  Hemoglobin 13.0 - 17.1 g/dL 13.3 13.0 13.5  Hematocrit 38.4 - 49.9 % 39.3 37.4(L) 39.5  Platelets 140 - 400 10e3/uL 165 150 180    Recent Labs  02/20/16 0926 02/20/16 0926 09/11/16 1236  NA  --  138 140  K  --  4.0 4.4  CO2  --  20* 24  GLUCOSE  --  145* 191*  BUN  --  18.5 20.2  CREATININE  --  1.3 1.4*  CALCIUM  --  9.3 9.5  PROT 7.5 7.8 7.8  ALBUMIN  --  3.6 3.7  AST  --  22 20  ALT  --  22 22  ALKPHOS  --  63 69  BILITOT  --  0.62 0.71   MGUS LAB: M-protein  12/05/2014: 0.6 05/04/15: 0.6 08/03/2015: 0.6 09/11/16: PENDING   IgG 12/05/14 1880 08/03/2015: 1625 3/15/2-18: PENDING     KAPPA, LAMBDA light chains and rat 12/05/2014 and are : 2.76, 1.56, 1.77 05/04/2015: 4.14, 1.69, 2.45 08/03/2015: 2.54, 1.25, 2.04 09/11/2016: PENDING   24 hour UPEP/IFE 12/08/2014 Comments: No monoclonal free light chains (Bence Jones Protein) are detected.Urine IFE shows polyclonal increase in free Kappa and/or free Lambdalight chains.  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 - SEE COMMENT. Diagnosis Note The bone marrow is normocellular, however, there is a mild increase in plasma cells (9% by aspirate, 5-10% by CD138). Light chain immunohistochemistry reveals an apparent kappa excess. 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 Correlation with clinical, laboratory, and radiographic data is required for further classification.  RADIOGRAPHIC STUDIES: I have personally reviewed the radiological images as listed and agreed  with the findings in the report.  Dg Bone Survey Met  12/12/2014   CLINICAL DATA:  Monoclonal gammopathy of uncertain significance. Question multiple myeloma.  EXAM: METASTATIC BONE SURVEY  COMPARISON:  None.  FINDINGS: A single lucent lesion on the lateral view of the skull measuring 0.7 cm is identified. 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 No other lytic lesion is seen. The patient has severe degenerative disease about the hips. 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 The patient is status post CABG. The lungs are clear. Scattered mild appearing spondylosis is seen about the spine. Bilateral iliac stents are in place.  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 No convincing evidence of multiple myeloma is identified.  Severe degenerative disease about the hips.   Electronically Signed   By: Inge Rise M.D.   On: 12/12/2014 13:57     ASSESSMENT & PLAN:  76 y.o.  male with past medical history of diabetes and chronic kidney disease  1. IgG MGUS -This was discovered by lab test. He is asymptomatic except chronic left hip pain from arthritis. -I previously reviewed his bone marrow biopsy results, which showed 5-10% of plasma cell. -I previously  reviewed his lab results. SPEP showed monoclonal IgG gammopathy, with low M protein level (0.5-0.6g/dl), serum free Kappa light chain is slightly elevated, light chain ratio was normal. -His initial lab showed normal CBC, CMP showed a normal calcium level, creatinine 1.44. He does have long-standing history of HTN and diabetes, which can cause chronic kidney disease. -A bone survey was negative for lytic lesions.  -Based on the above test results, he has IgG MGUS. No evidence of multiple myeloma. -I previously  reviewed the natural history of MGUS, and  the risk of progressing to multiple myeloma (1% per year). He scan protein has been remained the same since his diagnosis, his risk is probably low. I recommend surveillance with labs and office visit every 6 months. No treatment is needed. -I reviewed his lab results today, his CBC normal, CMP showed stable creatinine 1.4, calcium level  was normal. His SPEP and light chain levels are still pending. It was 0.6 6 months ago, overall stable. I'll call him about these results next week. No clinical concern for involving to multiple myeloma. -His clinical doing well and stable, we'll continue monitoring. If his M- protein is stable, I plan to see him once a year  2. HTN, DM, CKD -He will continue follow-up with his primary care physician, endocrinologist and nephrologist. -I previously discussed his Cr result, and copied Dr. Posey Pronto   3. Bilateral hip arthritis and back pain  -He will continue follow-up with his orthopedic surgeon, he has not decided if he wants total hip replacement.   Plan - lab reviewed with pt, I will call him next week about his SPEP/IFE and light chain levels result  -repeat lab every 6 months and I will see him in one year   All questions were answered. The patient knows to call the clinic with any problems, questions or concerns.  I spent 15 minutes counseling the patient face to face. The total time spent in the appointment was 20 minutes and more than 50% was on counseling.   Truitt Merle, MD 09/11/2016 1:29 PM

## 2016-09-10 DIAGNOSIS — G4733 Obstructive sleep apnea (adult) (pediatric): Secondary | ICD-10-CM | POA: Insufficient documentation

## 2016-09-10 DIAGNOSIS — Z9989 Dependence on other enabling machines and devices: Secondary | ICD-10-CM

## 2016-09-11 ENCOUNTER — Ambulatory Visit: Payer: Medicare Other

## 2016-09-11 ENCOUNTER — Ambulatory Visit (HOSPITAL_BASED_OUTPATIENT_CLINIC_OR_DEPARTMENT_OTHER): Payer: Medicare Other | Admitting: Hematology

## 2016-09-11 ENCOUNTER — Encounter: Payer: Self-pay | Admitting: Hematology

## 2016-09-11 ENCOUNTER — Other Ambulatory Visit (HOSPITAL_BASED_OUTPATIENT_CLINIC_OR_DEPARTMENT_OTHER): Payer: Medicare Other

## 2016-09-11 VITALS — BP 169/72 | HR 53 | Temp 98.5°F | Resp 18 | Ht 66.0 in | Wt 163.2 lb

## 2016-09-11 DIAGNOSIS — M25552 Pain in left hip: Secondary | ICD-10-CM | POA: Diagnosis not present

## 2016-09-11 DIAGNOSIS — M25551 Pain in right hip: Secondary | ICD-10-CM

## 2016-09-11 DIAGNOSIS — D472 Monoclonal gammopathy: Secondary | ICD-10-CM

## 2016-09-11 LAB — COMPREHENSIVE METABOLIC PANEL
ALT: 22 U/L (ref 0–55)
ANION GAP: 9 meq/L (ref 3–11)
AST: 20 U/L (ref 5–34)
Albumin: 3.7 g/dL (ref 3.5–5.0)
Alkaline Phosphatase: 69 U/L (ref 40–150)
BILIRUBIN TOTAL: 0.71 mg/dL (ref 0.20–1.20)
BUN: 20.2 mg/dL (ref 7.0–26.0)
CALCIUM: 9.5 mg/dL (ref 8.4–10.4)
CO2: 24 mEq/L (ref 22–29)
Chloride: 107 mEq/L (ref 98–109)
Creatinine: 1.4 mg/dL — ABNORMAL HIGH (ref 0.7–1.3)
EGFR: 55 mL/min/{1.73_m2} — AB (ref >90)
Glucose: 191 mg/dl — ABNORMAL HIGH (ref 70–140)
POTASSIUM: 4.4 meq/L (ref 3.5–5.1)
Sodium: 140 mEq/L (ref 136–145)
TOTAL PROTEIN: 7.8 g/dL (ref 6.4–8.3)

## 2016-09-11 LAB — CBC WITH DIFFERENTIAL/PLATELET
BASO%: 0.8 % (ref 0.0–2.0)
Basophils Absolute: 0.1 10*3/uL (ref 0.0–0.1)
EOS%: 1.9 % (ref 0.0–7.0)
Eosinophils Absolute: 0.1 10*3/uL (ref 0.0–0.5)
HEMATOCRIT: 39.3 % (ref 38.4–49.9)
HEMOGLOBIN: 13.3 g/dL (ref 13.0–17.1)
LYMPH#: 2.2 10*3/uL (ref 0.9–3.3)
LYMPH%: 28.2 % (ref 14.0–49.0)
MCH: 30.8 pg (ref 27.2–33.4)
MCHC: 33.9 g/dL (ref 32.0–36.0)
MCV: 90.9 fL (ref 79.3–98.0)
MONO#: 0.8 10*3/uL (ref 0.1–0.9)
MONO%: 10.3 % (ref 0.0–14.0)
NEUT#: 4.6 10*3/uL (ref 1.5–6.5)
NEUT%: 58.8 % (ref 39.0–75.0)
Platelets: 165 10*3/uL (ref 140–400)
RBC: 4.33 10*6/uL (ref 4.20–5.82)
RDW: 14.6 % (ref 11.0–14.6)
WBC: 7.9 10*3/uL (ref 4.0–10.3)

## 2016-09-12 LAB — KAPPA/LAMBDA LIGHT CHAINS
IG KAPPA FREE LIGHT CHAIN: 39.6 mg/L — AB (ref 3.3–19.4)
IG LAMBDA FREE LIGHT CHAIN: 16.6 mg/L (ref 5.7–26.3)
Kappa/Lambda FluidC Ratio: 2.39 — ABNORMAL HIGH (ref 0.26–1.65)

## 2016-09-15 LAB — MULTIPLE MYELOMA PANEL, SERUM
ALBUMIN/GLOB SERPL: 0.9 (ref 0.7–1.7)
ALPHA2 GLOB SERPL ELPH-MCNC: 0.7 g/dL (ref 0.4–1.0)
Albumin SerPl Elph-Mcnc: 3.3 g/dL (ref 2.9–4.4)
Alpha 1: 0.2 g/dL (ref 0.0–0.4)
B-GLOBULIN SERPL ELPH-MCNC: 1.1 g/dL (ref 0.7–1.3)
GAMMA GLOB SERPL ELPH-MCNC: 1.9 g/dL — AB (ref 0.4–1.8)
GLOBULIN, TOTAL: 3.9 g/dL (ref 2.2–3.9)
IgA, Qn, Serum: 303 mg/dL (ref 61–437)
IgM, Qn, Serum: 46 mg/dL (ref 15–143)
M PROTEIN SERPL ELPH-MCNC: 0.5 g/dL — AB
TOTAL PROTEIN: 7.2 g/dL (ref 6.0–8.5)

## 2016-09-24 ENCOUNTER — Telehealth: Payer: Self-pay | Admitting: Hematology

## 2016-09-24 NOTE — Telephone Encounter (Signed)
Called patient to inform him of next scheduled appointments. LVM

## 2016-10-13 DIAGNOSIS — R195 Other fecal abnormalities: Secondary | ICD-10-CM | POA: Insufficient documentation

## 2016-10-13 LAB — LIPID PANEL
Cholesterol: 204 — AB (ref 0–200)
HDL: 40 (ref 35–70)
LDL Cholesterol: 131
Triglycerides: 165 — AB (ref 40–160)

## 2016-12-29 ENCOUNTER — Other Ambulatory Visit: Payer: Self-pay | Admitting: Gastroenterology

## 2017-01-05 LAB — TSH: TSH: 0.33 — AB (ref 0.41–5.90)

## 2017-02-12 ENCOUNTER — Ambulatory Visit (HOSPITAL_COMMUNITY): Admission: RE | Admit: 2017-02-12 | Payer: Medicare Other | Source: Ambulatory Visit | Admitting: Gastroenterology

## 2017-02-12 ENCOUNTER — Encounter (HOSPITAL_COMMUNITY): Admission: RE | Payer: Self-pay | Source: Ambulatory Visit

## 2017-02-12 SURGERY — COLONOSCOPY WITH PROPOFOL
Anesthesia: Monitor Anesthesia Care

## 2017-03-12 ENCOUNTER — Other Ambulatory Visit: Payer: Medicare Other

## 2017-04-20 DIAGNOSIS — M160 Bilateral primary osteoarthritis of hip: Secondary | ICD-10-CM | POA: Insufficient documentation

## 2017-06-26 DIAGNOSIS — K429 Umbilical hernia without obstruction or gangrene: Secondary | ICD-10-CM | POA: Insufficient documentation

## 2017-07-08 DIAGNOSIS — I350 Nonrheumatic aortic (valve) stenosis: Secondary | ICD-10-CM | POA: Insufficient documentation

## 2017-07-10 ENCOUNTER — Other Ambulatory Visit: Payer: Self-pay | Admitting: Gastroenterology

## 2017-07-13 ENCOUNTER — Encounter (HOSPITAL_COMMUNITY): Payer: Self-pay | Admitting: Emergency Medicine

## 2017-07-13 ENCOUNTER — Other Ambulatory Visit: Payer: Self-pay

## 2017-07-14 DIAGNOSIS — Z87898 Personal history of other specified conditions: Secondary | ICD-10-CM | POA: Insufficient documentation

## 2017-07-14 DIAGNOSIS — K219 Gastro-esophageal reflux disease without esophagitis: Secondary | ICD-10-CM | POA: Insufficient documentation

## 2017-07-14 DIAGNOSIS — I4891 Unspecified atrial fibrillation: Secondary | ICD-10-CM | POA: Insufficient documentation

## 2017-07-14 DIAGNOSIS — E039 Hypothyroidism, unspecified: Secondary | ICD-10-CM | POA: Insufficient documentation

## 2017-07-22 ENCOUNTER — Other Ambulatory Visit: Payer: Self-pay

## 2017-07-22 ENCOUNTER — Emergency Department (HOSPITAL_COMMUNITY)
Admission: EM | Admit: 2017-07-22 | Discharge: 2017-07-23 | Disposition: A | Discharge status: Home / Self Care | Payer: Medicare Other | Source: Home / Self Care | Attending: Emergency Medicine | Admitting: Emergency Medicine

## 2017-07-22 ENCOUNTER — Encounter (HOSPITAL_COMMUNITY): Payer: Self-pay | Admitting: Certified Registered Nurse Anesthetist

## 2017-07-22 DIAGNOSIS — N189 Chronic kidney disease, unspecified: Secondary | ICD-10-CM

## 2017-07-22 DIAGNOSIS — K635 Polyp of colon: Secondary | ICD-10-CM | POA: Diagnosis not present

## 2017-07-22 DIAGNOSIS — Z79899 Other long term (current) drug therapy: Secondary | ICD-10-CM | POA: Insufficient documentation

## 2017-07-22 DIAGNOSIS — D124 Benign neoplasm of descending colon: Secondary | ICD-10-CM | POA: Diagnosis not present

## 2017-07-22 DIAGNOSIS — R197 Diarrhea, unspecified: Secondary | ICD-10-CM

## 2017-07-22 DIAGNOSIS — K648 Other hemorrhoids: Secondary | ICD-10-CM | POA: Diagnosis not present

## 2017-07-22 DIAGNOSIS — E1151 Type 2 diabetes mellitus with diabetic peripheral angiopathy without gangrene: Secondary | ICD-10-CM | POA: Diagnosis not present

## 2017-07-22 DIAGNOSIS — I739 Peripheral vascular disease, unspecified: Secondary | ICD-10-CM | POA: Insufficient documentation

## 2017-07-22 DIAGNOSIS — G4730 Sleep apnea, unspecified: Secondary | ICD-10-CM | POA: Diagnosis not present

## 2017-07-22 DIAGNOSIS — I129 Hypertensive chronic kidney disease with stage 1 through stage 4 chronic kidney disease, or unspecified chronic kidney disease: Secondary | ICD-10-CM | POA: Insufficient documentation

## 2017-07-22 DIAGNOSIS — I4891 Unspecified atrial fibrillation: Secondary | ICD-10-CM

## 2017-07-22 DIAGNOSIS — R112 Nausea with vomiting, unspecified: Secondary | ICD-10-CM | POA: Insufficient documentation

## 2017-07-22 DIAGNOSIS — E1122 Type 2 diabetes mellitus with diabetic chronic kidney disease: Secondary | ICD-10-CM | POA: Insufficient documentation

## 2017-07-22 DIAGNOSIS — I2510 Atherosclerotic heart disease of native coronary artery without angina pectoris: Secondary | ICD-10-CM | POA: Diagnosis not present

## 2017-07-22 DIAGNOSIS — Z7901 Long term (current) use of anticoagulants: Secondary | ICD-10-CM | POA: Diagnosis not present

## 2017-07-22 DIAGNOSIS — Z800 Family history of malignant neoplasm of digestive organs: Secondary | ICD-10-CM | POA: Diagnosis not present

## 2017-07-22 DIAGNOSIS — Z794 Long term (current) use of insulin: Secondary | ICD-10-CM | POA: Diagnosis not present

## 2017-07-22 DIAGNOSIS — Z8249 Family history of ischemic heart disease and other diseases of the circulatory system: Secondary | ICD-10-CM | POA: Diagnosis not present

## 2017-07-22 DIAGNOSIS — Z955 Presence of coronary angioplasty implant and graft: Secondary | ICD-10-CM | POA: Diagnosis not present

## 2017-07-22 DIAGNOSIS — Z1211 Encounter for screening for malignant neoplasm of colon: Secondary | ICD-10-CM | POA: Diagnosis not present

## 2017-07-22 DIAGNOSIS — Z9989 Dependence on other enabling machines and devices: Secondary | ICD-10-CM | POA: Diagnosis not present

## 2017-07-22 MED ORDER — SODIUM CHLORIDE 0.9 % IV BOLUS (SEPSIS)
500.0000 mL | Freq: Once | INTRAVENOUS | Status: AC
  Administered 2017-07-22: 500 mL via INTRAVENOUS

## 2017-07-22 NOTE — ED Provider Notes (Signed)
[facility] DEPT Provider Note   CSN: [id number] Arrival date & time: 07/22/17  1930     History   Chief Complaint Chief Complaint  Patient presents with  . Nausea  . Emesis  . Diarrhea    HPI Dustin [name] is a 77 y.o. male with a past medical history of A. fib, diabetes, hypertension, who presents to ED for evaluation of 2 episodes of NBNB emesis, nausea that occurred just prior to arrival.  He is scheduled for colonoscopy tomorrow and was on his third bottle of bowel prep solution when he had the vomiting.  He has been eating chicken broth all day today.  He reports solution and broth as a contents of his emesis.  He was given fluids and Zofran by EMS and reports complete resolution of his symptoms here.  He also felt a little bit lightheaded at the time.  He denies any loss of consciousness, abdominal pain, hematemesis, changes in bowel movements, urinary symptoms or fever.  His last colonoscopy was 15 years ago, before which he underwent a Fleet enema.  HPI  Past Medical History:  Diagnosis Date  . A-fib ([place])   . Chronic kidney disease   . Diabetes mellitus without complication ([place])   . Hypertension   . PVD (peripheral vascular disease) ([place]) 2008   stent placement in legs   . Sleep apnea     Patient Active Problem List   Diagnosis Date Noted  . MGUS (monoclonal gammopathy of unknown significance)   . DM 08/16/2009  . HYPERCHOLESTEROLEMIA 08/16/2009  . HYPERTENSION 08/16/2009  . CAD 08/16/2009    Past Surgical History:  Procedure Laterality Date  . CORONARY ANGIOPLASTY WITH STENT PLACEMENT  2008  . CORONARY ARTERY BYPASS GRAFT  1992       Home Medications    Prior to Admission medications   Medication Sig Start Date End Date Taking? Authorizing Provider  Cholecalciferol (VITAMIN D3) 5000 UNITS CAPS Take 5,000 Units by mouth daily.   Yes [provider]  Cyanocobalamin (B-12) 5000 MCG CAPS Take 1 capsule by mouth  daily.   Yes [provider]  furosemide (LASIX) 20 MG tablet Take 20 mg by mouth daily as needed. Reported on 08/03/2015 02/28/15  Yes [provider]  hydrALAZINE (APRESOLINE) 25 MG tablet Take 25 mg by mouth 3 (three) times daily.   Yes [provider]  Insulin Glargine (TOUJEO SOLOSTAR) 300 UNIT/ML SOPN Inject 46 Units into the skin at bedtime.    Yes [provider]  linagliptin (TRADJENTA) 5 MG TABS tablet Take 5 mg by mouth daily.   Yes [provider]  lisinopril (PRINIVIL,ZESTRIL) 40 MG tablet Take 40 mg by mouth daily.    Yes [provider]  Magnesium 250 MG TABS Take 2 tablets by mouth daily.   Yes [provider]  metoprolol (LOPRESSOR) 50 MG tablet Take 50 mg by mouth 2 (two) times daily.   Yes [provider]  nitroGLYCERIN (NITROSTAT) 0.4 MG SL tablet Place 0.4 mg under the tongue every 5 (five) minutes as needed for chest pain. Reported on 08/03/2015 03/24/14  Yes [provider]  NON FORMULARY Take 12.5 mg by mouth daily. Iodine complex   Yes [provider]  NON FORMULARY Take 5,000 mcg by mouth daily. Folate   Yes [provider]  pravastatin (PRAVACHOL) 20 MG tablet Take 1 tablet by mouth at bedtime. 08/09/16  Yes [provider]  rivaroxaban (XARELTO) 20 MG TABS tablet  Take 20 mg by mouth daily with supper.   Yes [provider]  timolol (TIMOPTIC) 0.5 % ophthalmic solution Place 1 drop into both eyes daily.   Yes [provider]  Travoprost, BAK Free, (TRAVATAN) 0.004 % SOLN ophthalmic solution Place 1 drop into both eyes at bedtime.    Yes [provider]    Family History Family History  Problem Relation Age of Onset  . Heart failure Mother   . Heart failure Father   . Diabetes Sister   . Congenital heart disease Sister   . Cancer Brother 51       prostate and colon cancer     Social History Social History   Tobacco Use  . Smoking  status: Never Smoker  . Smokeless tobacco: Never Used  Substance Use Topics  . Alcohol use: No  . Drug use: No     Allergies   Patient has no known allergies.   Review of Systems Review of Systems  Constitutional: Negative for appetite change, chills and fever.  HENT: Negative for ear pain, rhinorrhea, sneezing and sore throat.   Eyes: Negative for photophobia and visual disturbance.  Respiratory: Negative for cough, chest tightness, shortness of breath and wheezing.   Cardiovascular: Negative for chest pain and palpitations.  Gastrointestinal: Positive for nausea and vomiting. Negative for abdominal pain, blood in stool, constipation and diarrhea.  Genitourinary: Negative for dysuria, hematuria and urgency.  Musculoskeletal: Negative for myalgias.  Skin: Negative for rash.  Neurological: Positive for light-headedness. Negative for dizziness and weakness.     Physical Exam Updated Vital Signs BP 132/62   Pulse (!) 101   Temp 98.5 F (36.9 C)   Resp 20   Ht 5' 6.5" (1.689 m)   Wt 82.6 kg (182 lb)   SpO2 97%   BMI 28.94 kg/m   Physical Exam  Constitutional: He appears well-developed and well-nourished. No distress.  Nontoxic appearing and in no acute distress.  HENT:  Head: Normocephalic and atraumatic.  Nose: Nose normal.  Eyes: Conjunctivae and EOM are normal. Left eye exhibits no discharge. No scleral icterus.  Neck: Normal range of motion. Neck supple.  Cardiovascular: Normal rate, regular rhythm, normal heart sounds and intact distal pulses. Exam reveals no gallop and no friction rub.  No murmur heard. Pulmonary/Chest: Effort normal and breath sounds normal. No respiratory distress.  Abdominal: Soft. Bowel sounds are normal. He exhibits no distension. There is no tenderness. There is no guarding.  No abdominal tenderness to palpation.  Musculoskeletal: Normal range of motion. He exhibits no edema.  Neurological: He is alert. He exhibits normal muscle tone.  Coordination normal.  Skin: Skin is warm and dry. No rash noted.  Psychiatric: He has a normal mood and affect.  Nursing note and vitals reviewed.    ED Treatments / Results  Labs (all labs ordered are listed, but only abnormal results are displayed) Labs Reviewed - No data to display  EKG  EKG Interpretation None       Radiology No results found.  Procedures Procedures (including critical care time)  Medications Ordered in ED Medications  sodium chloride 0.9 % bolus 500 mL (500 mLs Intravenous New Bag/Given 07/22/17 2117)     Initial Impression / Assessment and Plan / ED Course  I have reviewed the triage vital signs and the nursing notes.  Pertinent labs & imaging results that were available during my care of the patient were reviewed by me and considered in my medical decision making (  see chart for details).  Clinical Course as of Jan 24 0001  Wed Jul 22, 2017  2245 Patient tolerating bowel prep solution at this time.  [HK]  2340 Patient has taken about 3 cups of bowel prep solution and continues to tolerate well.  [HK]    Clinical Course User Index [HK] Delia Heady, PA-C    Patient presents to ED for evaluation of acute onset of nausea and vomiting while drinking bowel prep solution for colonoscopy scheduled tomorrow morning. He was on cup 3 of 8 when he began vomiting, felt lightheaded. He has been eating chicken broth as well prior to symptoms beginning. EMS gave Zofran, fluids with resolution of patient's lightheadedness and nausea. He appears NAD. Vital signs are within normal limits. No abdominal tenderness to palpation. Patient is tolerating bowel prep solution here in the ED. He is scheduled for his colonoscopy to evaluate for GI bleeding in approximately 7hrs here at [facility].  He continues to tolerate p.o. intake well here.  He is also beginning to have a bowel movements.  I suspect that patient is stable enough for discharge and return in the morning to  complete his colonoscopy. I believe his nausea and vomiting was a result of his bowel prep solution intake, rather than acute intraabdominal pathology. Patient and wife agreed to this plan.  Patient appears stable for discharge at this time.  Strict return precautions given.  Patient discussed with and seen by Dr. Eulis Foster.  Final Clinical Impressions(s) / ED Diagnoses   Final diagnoses:  Non-intractable vomiting with nausea, unspecified vomiting type    ED Discharge Orders    None     Portions of this note were generated with Dragon dictation software. Dictation errors may occur despite best attempts at proofreading.    Delia Heady, PA-C 07/23/17 0002    Daleen Bo, MD 07/23/17 [phone]

## 2017-07-22 NOTE — ED Notes (Signed)
Bed: DI26 Expected date:  Expected time:  Means of arrival:  Comments: 76 yr nausea vomiting

## 2017-07-22 NOTE — ED Provider Notes (Signed)
  Face-to-face evaluation   History: He is here for evaluation of vomiting which occurred while drinking stool softener, as prep for colonoscopy, to be done tomorrow morning.  Transferred by EMS and received Zofran with improvement in nausea.   Physical exam: Alert, elderly man.  Lungs clear.  Heart regular rate and rhythm.  Abdomen soft and nontender.  Medical screening examination/treatment/procedure(s) were conducted as a shared visit with non-physician practitioner(s) and myself.  I personally evaluated the patient during the encounter    Daleen Bo, MD 07/23/17 1700

## 2017-07-22 NOTE — Anesthesia Preprocedure Evaluation (Addendum)
Anesthesia Evaluation  Patient identified by MRN, date of birth, ID band Patient awake    Reviewed: Allergy & Precautions, NPO status , Patient's Chart, lab work & pertinent test results, reviewed documented beta blocker date and time   Airway Mallampati: III  TM Distance: >3 FB Neck ROM: Full    Dental  (+) Dental Advisory Given   Pulmonary sleep apnea and Continuous Positive Airway Pressure Ventilation ,    Pulmonary exam normal breath sounds clear to auscultation       Cardiovascular Exercise Tolerance: Poor hypertension, Pt. on home beta blockers and Pt. on medications + CAD, + Cardiac Stents and + Peripheral Vascular Disease  Normal cardiovascular exam+ dysrhythmias Atrial Fibrillation + Valvular Problems/Murmurs AS  Rhythm:Irregular Rate:Normal  TTE 2018 - EF is estimated at 55 to 60%.Grade 1 diastolic dysfunction. Mild concentric left ventricular hypertrophy.Mildly dilated left atrium. Mild MR, mild AI, mod AS.     Neuro/Psych negative neurological ROS  negative psych ROS   GI/Hepatic negative GI ROS, Neg liver ROS,   Endo/Other  diabetes, Insulin Dependent  Renal/GU CRFRenal disease  negative genitourinary   Musculoskeletal negative musculoskeletal ROS (+)   Abdominal   Peds  Hematology negative hematology ROS (+)   Anesthesia Other Findings   Reproductive/Obstetrics                            Anesthesia Physical Anesthesia Plan  ASA: II  Anesthesia Plan: General   Post-op Pain Management:    Induction: Intravenous, Rapid sequence and Cricoid pressure planned  PONV Risk Score and Plan: Propofol infusion, Treatment may vary due to age or medical condition and Ondansetron  Airway Management Planned: Oral ETT  Additional Equipment: None  Intra-op Plan:   Post-operative Plan: Extubation in OR  Informed Consent: I have reviewed the patients History and Physical, chart,  labs and discussed the procedure including the risks, benefits and alternatives for the proposed anesthesia with the patient or authorized representative who has indicated his/her understanding and acceptance.   Dental advisory given  Plan Discussed with: CRNA  Anesthesia Plan Comments:        Anesthesia Quick Evaluation

## 2017-07-22 NOTE — ED Triage Notes (Signed)
Patient BIB [facility] from hotel. Pt is from [place], [place] here for colonoscopy in the am. Pt began drinking bowel prep at 1800. On 3rd dose at 1845 pt began to have n/v/d. Initial palpated pressure was 70. EMS gave 500 NS bolus and 4mg  Zofran. Last BP 137/72. Pt has hx of afib and DM.

## 2017-07-23 ENCOUNTER — Ambulatory Visit (HOSPITAL_COMMUNITY)
Admission: RE | Admit: 2017-07-23 | Discharge: 2017-07-23 | Disposition: A | Discharge status: Home / Self Care | Payer: Medicare Other | Source: Ambulatory Visit | Attending: Gastroenterology | Admitting: Gastroenterology

## 2017-07-23 ENCOUNTER — Encounter (HOSPITAL_COMMUNITY): Payer: Self-pay | Admitting: *Deleted

## 2017-07-23 ENCOUNTER — Ambulatory Visit (HOSPITAL_COMMUNITY): Payer: Medicare Other | Admitting: Certified Registered Nurse Anesthetist

## 2017-07-23 ENCOUNTER — Encounter (HOSPITAL_COMMUNITY)
Admission: RE | Disposition: A | Discharge status: Home / Self Care | Payer: Self-pay | Source: Ambulatory Visit | Attending: Gastroenterology

## 2017-07-23 DIAGNOSIS — Z955 Presence of coronary angioplasty implant and graft: Secondary | ICD-10-CM | POA: Insufficient documentation

## 2017-07-23 DIAGNOSIS — Z8249 Family history of ischemic heart disease and other diseases of the circulatory system: Secondary | ICD-10-CM | POA: Insufficient documentation

## 2017-07-23 DIAGNOSIS — Z794 Long term (current) use of insulin: Secondary | ICD-10-CM | POA: Insufficient documentation

## 2017-07-23 DIAGNOSIS — I129 Hypertensive chronic kidney disease with stage 1 through stage 4 chronic kidney disease, or unspecified chronic kidney disease: Secondary | ICD-10-CM | POA: Insufficient documentation

## 2017-07-23 DIAGNOSIS — Z1211 Encounter for screening for malignant neoplasm of colon: Secondary | ICD-10-CM | POA: Diagnosis not present

## 2017-07-23 DIAGNOSIS — G4730 Sleep apnea, unspecified: Secondary | ICD-10-CM | POA: Insufficient documentation

## 2017-07-23 DIAGNOSIS — I2510 Atherosclerotic heart disease of native coronary artery without angina pectoris: Secondary | ICD-10-CM | POA: Insufficient documentation

## 2017-07-23 DIAGNOSIS — Z9989 Dependence on other enabling machines and devices: Secondary | ICD-10-CM | POA: Insufficient documentation

## 2017-07-23 DIAGNOSIS — Z7901 Long term (current) use of anticoagulants: Secondary | ICD-10-CM | POA: Insufficient documentation

## 2017-07-23 DIAGNOSIS — D124 Benign neoplasm of descending colon: Secondary | ICD-10-CM | POA: Diagnosis not present

## 2017-07-23 DIAGNOSIS — I4891 Unspecified atrial fibrillation: Secondary | ICD-10-CM | POA: Insufficient documentation

## 2017-07-23 DIAGNOSIS — K648 Other hemorrhoids: Secondary | ICD-10-CM | POA: Diagnosis not present

## 2017-07-23 DIAGNOSIS — K635 Polyp of colon: Secondary | ICD-10-CM | POA: Diagnosis not present

## 2017-07-23 DIAGNOSIS — N189 Chronic kidney disease, unspecified: Secondary | ICD-10-CM | POA: Insufficient documentation

## 2017-07-23 DIAGNOSIS — Z79899 Other long term (current) drug therapy: Secondary | ICD-10-CM | POA: Insufficient documentation

## 2017-07-23 DIAGNOSIS — E1151 Type 2 diabetes mellitus with diabetic peripheral angiopathy without gangrene: Secondary | ICD-10-CM | POA: Insufficient documentation

## 2017-07-23 DIAGNOSIS — E1122 Type 2 diabetes mellitus with diabetic chronic kidney disease: Secondary | ICD-10-CM | POA: Insufficient documentation

## 2017-07-23 DIAGNOSIS — Z800 Family history of malignant neoplasm of digestive organs: Secondary | ICD-10-CM | POA: Insufficient documentation

## 2017-07-23 HISTORY — PX: COLONOSCOPY WITH PROPOFOL: SHX5780

## 2017-07-23 LAB — GLUCOSE, CAPILLARY: Glucose-Capillary: 157 mg/dL — ABNORMAL HIGH (ref 65–99)

## 2017-07-23 SURGERY — COLONOSCOPY WITH PROPOFOL
Anesthesia: General

## 2017-07-23 MED ORDER — SUCCINYLCHOLINE CHLORIDE 200 MG/10ML IV SOSY
PREFILLED_SYRINGE | INTRAVENOUS | Status: DC | PRN
  Administered 2017-07-23: 120 mg via INTRAVENOUS

## 2017-07-23 MED ORDER — LIDOCAINE 2% (20 MG/ML) 5 ML SYRINGE
INTRAMUSCULAR | Status: DC | PRN
  Administered 2017-07-23: 60 mg via INTRAVENOUS

## 2017-07-23 MED ORDER — ONDANSETRON HCL 4 MG/2ML IJ SOLN
INTRAMUSCULAR | Status: DC | PRN
  Administered 2017-07-23: 4 mg via INTRAVENOUS

## 2017-07-23 MED ORDER — SODIUM CHLORIDE 0.9 % IV SOLN: INTRAVENOUS | Status: DC

## 2017-07-23 MED ORDER — LACTATED RINGERS IV SOLN
INTRAVENOUS | Status: DC
  Administered 2017-07-23: 07:00:00 via INTRAVENOUS

## 2017-07-23 MED ORDER — PROPOFOL 10 MG/ML IV BOLUS
INTRAVENOUS | Status: DC | PRN
  Administered 2017-07-23: 120 mg via INTRAVENOUS

## 2017-07-23 MED ORDER — PHENYLEPHRINE 40 MCG/ML (10ML) SYRINGE FOR IV PUSH (FOR BLOOD PRESSURE SUPPORT)
PREFILLED_SYRINGE | INTRAVENOUS | Status: DC | PRN
  Administered 2017-07-23 (×5): 80 ug via INTRAVENOUS

## 2017-07-23 MED ORDER — PROPOFOL 10 MG/ML IV BOLUS
INTRAVENOUS | Status: AC
  Filled 2017-07-23: qty 60

## 2017-07-23 MED ORDER — FENTANYL CITRATE (PF) 100 MCG/2ML IJ SOLN
INTRAMUSCULAR | Status: AC
  Filled 2017-07-23: qty 2

## 2017-07-23 MED ORDER — FENTANYL CITRATE (PF) 100 MCG/2ML IJ SOLN
INTRAMUSCULAR | Status: DC | PRN
  Administered 2017-07-23: 50 ug via INTRAVENOUS

## 2017-07-23 SURGICAL SUPPLY — 21 items

## 2017-07-23 NOTE — Anesthesia Procedure Notes (Signed)
Procedure Name: Intubation Date/Time: 07/23/2017 7:31 AM Performed by: Claudia Desanctis, CRNA Pre-anesthesia Checklist: Patient identified, Emergency Drugs available, Suction available and Patient being monitored Patient Re-evaluated:Patient Re-evaluated prior to induction Oxygen Delivery Method: Circle system utilized Preoxygenation: Pre-oxygenation with 100% oxygen Induction Type: IV induction Ventilation: Mask ventilation without difficulty Laryngoscope Size: 2 and Miller Grade View: Grade I Tube type: Oral Tube size: 8.0 mm Number of attempts: 1 Airway Equipment and Method: Stylet Placement Confirmation: ETT inserted through vocal cords under direct vision,  positive ETCO2 and breath sounds checked- equal and bilateral Secured at: 23 cm Tube secured with: Tape Dental Injury: Teeth and Oropharynx as per pre-operative assessment

## 2017-07-23 NOTE — Discharge Instructions (Signed)
Please read attached information regarding your condition. Continue your bowel prep regimen as directed. Follow-up with your primary care provider for further evaluation. Return to ED for worsening symptoms, increased vomiting, lightheadedness or loss of consciousness.

## 2017-07-23 NOTE — Transfer of Care (Signed)
Immediate Anesthesia Transfer of Care Note  Patient: Dustin [name]  Procedure(s) Performed: COLONOSCOPY WITH PROPOFOL (N/A )  Patient Location: Endoscopy Unit  Anesthesia Type:General  Level of Consciousness: drowsy  Airway & Oxygen Therapy: Patient Spontanous Breathing and Patient connected to face mask  Post-op Assessment: Report given to RN and Post -op Vital signs reviewed and stable  Post vital signs: Reviewed and stable  Last Vitals:  Vitals:   07/23/17 0645  BP: 113/65  Pulse: 88  Resp: 20  Temp: 36.6 C  SpO2: 98%    Last Pain:  Vitals:   07/23/17 0645  TempSrc: Oral         Complications: No apparent anesthesia complications

## 2017-07-23 NOTE — Op Note (Signed)
[facility] Patient Name: Dustin [name] Procedure Date: 07/23/2017 MRN: [id number] Attending MD: Juanita Craver , MD Date of Birth: [birth year] CSN: [id number] Age: 77 Admit Type: Inpatient Procedure:                Colonoscopy with cold biopsies. Indications:              Family history of colon cancer-brother; CRC                            screening for colorectal malignant neoplasm Providers:                Juanita Craver, MD, Laverta Baltimore RN, RN, Alan Mulder, Technician Referring MD:             Mohammed Kindle, MD Medicines:                General Anesthesia Complications:            No immediate complications. Estimated Blood Loss:     Estimated blood loss was minimal. Procedure:                Pre-anesthesia assessment: - Prior to the                            procedure, a history and physical was performed,                            and patient medications and allergies were                            reviewed. The patient's tolerance of previous                            anesthesia was also reviewed. The risks and                            benefits of the procedure and the sedation options                            and risks were discussed with the patient. All                            questions were answered, and informed consent was                            obtained. Prior anticoagulants: The patient has                            taken Xarelto (rivaroxaban), last dose was 4 days                            prior to procedure. ASA Grade assessment: IV - A  patient with severe systemic disease that is a                            constant threat to life. After reviewing the risks                            and benefits, the patient was deemed in                            satisfactory condition to undergo the procedure.                            After obtaining informed consent, the colonoscope                      was passed under direct vision. Throughout the                            procedure, the patient's blood pressure, pulse, and                            oxygen saturations were monitored continuously. The                            colonoscope was introduced through the anus and                            advanced to the the terminal ileum, with                            identification of the appendiceal orifice and IC                            valve. The colonoscopy was performed without                            difficulty. The patient tolerated the procedure                            well. The quality of the bowel preparation was                            adequate. The terminal ileum, the ileocecal valve,                            the appendiceal orifice and the rectum were                            photographed. The bowel preparation used was                            GoLYTELY. Scope In: 7:35:45 AM Scope Out: 7:52:24 AM Scope Withdrawal Time: 0 hours 13 minutes 13 seconds  Total Procedure Duration: 0 hours 16 minutes 39 seconds  Findings:  Six small sessile polyps were found, 5 in the descending colon and 1 in       the cecum-these were removed by cold biopsies.      Small internal hemorrhoids were noted found during retroflexion.      The exam was otherwise without abnormality. Impression:               - Six small sessile polyps, 5 in the descending                            colon and 1 in the cecum-these were removed by cold                            biopsies.                           - Small internal hemorrhoids.                           - The examination was otherwise normal. Moderate Sedation:      GA given. Recommendation:           - High fiber diet with augmented water consumption                            daily.                           - Continue present medications.                           - Await pathology results.                            - Repeat colonoscopy is not recommended for                            surveillance due to patients age.                           - Return to GI office PRN.                           - If the patient has any abnormal GI symptoms in                            the interim, he has been advised to call the office                            ASAP for further recommendations. Procedure Code(s):        --- Professional ---                           [phone], Colonoscopy, flexible; with biopsy, single                            or multiple Diagnosis Code(s):        ---  Professional ---                           D12.0, Benign neoplasm of cecum                           D12.4, Benign neoplasm of descending colon                           Z80.0, Family history of malignant neoplasm of                            digestive organs                           Z12.11, Encounter for screening for malignant                            neoplasm of colon CPT copyright 2016 American Medical Association. All rights reserved. The codes documented in this report are preliminary and upon coder review may  be revised to meet current compliance requirements. Juanita Craver, MD Juanita Craver, MD 07/23/2017 8:18:30 AM This report has been signed electronically. Number of Addenda: 0

## 2017-07-23 NOTE — Anesthesia Postprocedure Evaluation (Signed)
Anesthesia Post Note  Patient: Dustin [name]  Procedure(s) Performed: COLONOSCOPY WITH PROPOFOL (N/A )     Patient location during evaluation: PACU Anesthesia Type: General Level of consciousness: awake and alert Pain management: pain level controlled Vital Signs Assessment: post-procedure vital signs reviewed and stable Respiratory status: spontaneous breathing, nonlabored ventilation and respiratory function stable Cardiovascular status: blood pressure returned to baseline and stable Postop Assessment: no apparent nausea or vomiting Anesthetic complications: no    Last Vitals:  Vitals:   07/23/17 0820 07/23/17 0830  BP: 130/75 128/70  Pulse: 65 (!) 58  Resp: 18 (!) 21  Temp:    SpO2: 96% 96%    Last Pain:  Vitals:   07/23/17 0801  TempSrc: Oral                 [name]

## 2017-07-23 NOTE — H&P (Addendum)
Dustin [name] is an 77 y.o. male.   Chief Complaint: Colorectal cancer screening. HPI: 77 year old black male here for a screening colonoscopy. He has been noted to have guaiac positive stools recently. His brother was diagnosed with colon cancer in his 51's. He came to the ER last night for nausea after he started drinking the prep. He was given Zofran and sent home. His last dose of Xarelto was on 07/19/2017. See office notes for details.   Past Medical History:  Diagnosis Date  . A-fib ([place])   . Chronic kidney disease   . Diabetes mellitus without complication ([place])   . Hypertension   . PVD (peripheral vascular disease) ([place]) 2008   stent placement in legs   . Sleep apnea    Past Surgical History:  Procedure Laterality Date  . CORONARY ANGIOPLASTY WITH STENT PLACEMENT  2008  . CORONARY ARTERY BYPASS GRAFT  1992   Family History  Problem Relation Age of Onset  . Heart failure Mother   . Heart failure Father   . Diabetes Sister   . Congenital heart disease Sister   . Cancer Brother 41       prostate and colon cancer    Social History:  reports that  has never smoked. he has never used smokeless tobacco. He reports that he does not drink alcohol or use drugs.  Allergies: No Known Allergies  Medications Prior to Admission  Medication Sig Dispense Refill  . Cholecalciferol (VITAMIN D3) 5000 UNITS CAPS Take 5,000 Units by mouth daily.    . Cyanocobalamin (B-12) 5000 MCG CAPS Take 1 capsule by mouth daily.    . furosemide (LASIX) 20 MG tablet Take 20 mg by mouth daily as needed. Reported on 08/03/2015  0  . hydrALAZINE (APRESOLINE) 25 MG tablet Take 25 mg by mouth 3 (three) times daily.    . Insulin Glargine (TOUJEO SOLOSTAR) 300 UNIT/ML SOPN Inject 46 Units into the skin at bedtime.     Marland Kitchen linagliptin (TRADJENTA) 5 MG TABS tablet Take 5 mg by mouth daily.    Marland Kitchen lisinopril (PRINIVIL,ZESTRIL) 40 MG tablet Take 40 mg by mouth daily.     . Magnesium 250 MG TABS Take 2 tablets by mouth  daily.    . metoprolol (LOPRESSOR) 50 MG tablet Take 50 mg by mouth 2 (two) times daily.    . NON FORMULARY Take 12.5 mg by mouth daily. Iodine complex    . NON FORMULARY Take 5,000 mcg by mouth daily. Folate    . pravastatin (PRAVACHOL) 20 MG tablet Take 1 tablet by mouth at bedtime.  2  . rivaroxaban (XARELTO) 20 MG TABS tablet Take 20 mg by mouth daily with supper.    . timolol (TIMOPTIC) 0.5 % ophthalmic solution Place 1 drop into both eyes daily.    . Travoprost, BAK Free, (TRAVATAN) 0.004 % SOLN ophthalmic solution Place 1 drop into both eyes at bedtime.     . nitroGLYCERIN (NITROSTAT) 0.4 MG SL tablet Place 0.4 mg under the tongue every 5 (five) minutes as needed for chest pain. Reported on 08/03/2015      No results found for this or any previous visit (from the past 48 hour(s)). No results found.  Review of Systems  Constitutional: Negative.   HENT: Negative.   Eyes: Negative.   Respiratory: Negative.   Cardiovascular: Negative.   Gastrointestinal: Negative.   Genitourinary: Negative.   Musculoskeletal: Positive for joint pain.  Skin: Negative.   Neurological: Negative.   Endo/Heme/Allergies:  Negative.   Psychiatric/Behavioral: Negative.    Blood pressure 113/65, pulse 88, temperature 97.9 F (36.6 C), temperature source Oral, resp. rate 20, SpO2 98 %. Physical Exam  Constitutional: He is oriented to person, place, and time. He appears well-developed and well-nourished.  HENT:  Head: Normocephalic and atraumatic.  Eyes: Conjunctivae and EOM are normal. Pupils are equal, round, and reactive to light.  Neck: Normal range of motion. Neck supple.  Cardiovascular: Normal rate. An irregularly irregular rhythm present.  Respiratory: Effort normal and breath sounds normal.  GI: Soft. Bowel sounds are normal.  Musculoskeletal: Normal range of motion.  Neurological: He is alert and oriented to person, place, and time.  Skin: Skin is warm and dry.  Psychiatric: He has a normal  mood and affect. His behavior is normal. Judgment and thought content normal.   Assessment/Plan 1) Colorectal cancer screening/family history of colon cancer-proceed with a colonoscopy at this time.  2) Atrial fibrillation on Xarelto that been held since 07/19/17.  3) AODM. Paitlyn Mcclatchey, MD 07/23/2017, 7:00 AM

## 2017-07-23 NOTE — Discharge Instructions (Signed)
YOU HAD AN ENDOSCOPIC PROCEDURE TODAY: Refer to the procedure report and other information in the discharge instructions given to you for any specific questions about what was found during the examination. If this information does not answer your questions, please call [place] at [phone] to clarify.   YOU SHOULD EXPECT: Some feelings of bloating in the abdomen. Passage of more gas than usual. Walking can help get rid of the air that was put into your GI tract during the procedure and reduce the bloating. If you had a lower endoscopy (such as a colonoscopy or flexible sigmoidoscopy) you may notice spotting of blood in your stool or on the toilet paper. Some abdominal soreness may be present for a day or two, also.  DIET: Your first meal following the procedure should be a light meal and then it is ok to progress to your normal diet. A half-sandwich or bowl of soup is an example of a good first meal. Heavy or fried foods are harder to digest and may make you feel nauseous or bloated. Drink plenty of fluids but you should avoid alcoholic beverages for 24 hours. If you had an esophageal dilation, please see attached information for diet.   ACTIVITY: Your care partner should take you home directly after the procedure. You should plan to take it easy, moving slowly for the rest of the day. You can resume normal activity the day after the procedure however YOU SHOULD NOT DRIVE, use power tools, machinery or perform tasks that involve climbing or major physical exertion for 24 hours (because of the sedation medicines used during the test).   SYMPTOMS TO REPORT IMMEDIATELY: A gastroenterologist can be reached at any hour. Please call [phone]  for any of the following symptoms:  Following lower endoscopy (colonoscopy, flexible sigmoidoscopy) Excessive amounts of blood in the stool  Significant tenderness, worsening of abdominal pains  Swelling of the abdomen that is new, acute  Fever of  100 or higher  Following upper endoscopy (EGD, EUS, ERCP, esophageal dilation) Vomiting of blood or coffee ground material  New, significant abdominal pain  New, significant chest pain or pain under the shoulder blades  Painful or persistently difficult swallowing  New shortness of breath  Black, tarry-looking or red, bloody stools  FOLLOW UP:  If any biopsies were taken you will be contacted by phone or by letter within the next 1-3 weeks. Call [phone]  if you have not heard about the biopsies in 3 weeks.  Please also call with any specific questions about appointments or follow up tests. General Anesthesia, Adult, Care After These instructions provide you with information about caring for yourself after your procedure. Your health care provider may also give you more specific instructions. Your treatment has been planned according to current medical practices, but problems sometimes occur. Call your health care provider if you have any problems or questions after your procedure. What can I expect after the procedure? After the procedure, it is common to have:  Vomiting.  A sore throat.  Mental slowness.  It is common to feel:  Nauseous.  Cold or shivery.  Sleepy.  Tired.  Sore or achy, even in parts of your body where you did not have surgery.  Follow these instructions at home: For at least 24 hours after the procedure:  Do not: ? Participate in activities where you could fall or become injured. ? Drive. ? Use heavy machinery. ? Drink alcohol. ? Take sleeping pills or medicines that cause drowsiness. ?  Make important decisions or sign legal documents. ? Take care of children on your own.  Rest. Eating and drinking  If you vomit, drink water, juice, or soup when you can drink without vomiting.  Drink enough fluid to keep your urine clear or pale yellow.  Make sure you have little or no nausea before eating solid foods.  Follow the diet recommended by  your health care provider. General instructions  Have a responsible adult stay with you until you are awake and alert.  Return to your normal activities as told by your health care provider. Ask your health care provider what activities are safe for you.  Take over-the-counter and prescription medicines only as told by your health care provider.  If you smoke, do not smoke without supervision.  Keep all follow-up visits as told by your health care provider. This is important. Contact a health care provider if:  You continue to have nausea or vomiting at home, and medicines are not helpful.  You cannot drink fluids or start eating again.  You cannot urinate after 8-12 hours.  You develop a skin rash.  You have fever.  You have increasing redness at the site of your procedure. Get help right away if:  You have difficulty breathing.  You have chest pain.  You have unexpected bleeding.  You feel that you are having a life-threatening or urgent problem. This information is not intended to replace advice given to you by your health care provider. Make sure you discuss any questions you have with your health care provider. Document Released: 09/22/2000 Document Revised: 11/19/2015 Document Reviewed: 05/31/2015 Elsevier Interactive Patient Education  [employer].

## 2017-07-24 ENCOUNTER — Encounter (HOSPITAL_COMMUNITY): Payer: Self-pay | Admitting: Gastroenterology

## 2017-09-08 NOTE — Progress Notes (Signed)
Dustin [name]  [phone] Fax:([phone]) [phone]  Clinic follow up Note   Patient Care Team: Dustin Fries, MD as PCP - General (Internal Medicine) Dustin Merle, MD as Consulting Physician (Hematology) Dustin Spurling, MD as Consulting Physician (Internal Medicine) Dustin Shiley, MD as Consulting Physician (Nephrology)   Date of Service:  09/10/2017  CHIEF COMPLAINTS:  Follow up IgG MGUS   HISTORY OF PRESENTING ILLNESS (11/2014):  Dustin [name] 77 y.o. male is here because of abnormal SPEP.  He has long-standing history of diabetes, and chronic kidney disease. He was recently referred to a nephrologist Dr. Posey Pronto on 11/01/2014. As part of her workup, SPEP and light chain level was checked. 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 Serum free coprolite chain was elevated at 39, lambda light chain was normal, ratio was 2.62.  He feels well, no pain, except sometime left hip pain when he walks, he uses a crane, no other pain. He has good appetite and energy, he is not very active due to the left hip pain.  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 He presents to the clinic today accompanied by his wife.   Of note, Since last visit he presented to the ED with non-intractable vomit and lightheadedness on 07/22/17. He was treated with Zofran. He got sick during 3rd bottles of colonoscopy prep. He had a colonoscopy by Dr. Collene Mares on 07/23/17. 6 small polyps and small internal hemorrhoids were found. The polyps were removed and biopsies. Pathology results showed no evidence of malignancy.   Pt notes his colonoscopy went well and results were benign. He went to [facility] in 07/2017 to get clearance for a hip replacement. He went for a ECHO and stress test. Stress test results were not adequate. 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 Cardiologist do not plan to do surgery at this time given he  already had bypass surgery. He is not cleared for hip surgery and uses a walker now. He is not being active. He has gained weight. He takes Bare aspirin currently.   On review of symptoms, pt weight gain of 20 pounds and b/l hip pain and back pain. He denies nay new hip pain.     MEDICAL HISTORY:  Past Medical History:  Diagnosis Date  . A-fib ([place])   . Chronic kidney disease   . Diabetes mellitus without complication ([place])   . Hypertension   . PVD (peripheral vascular disease) ([place]) 2008   stent placement in legs   . Sleep apnea     SURGICAL HISTORY: Past Surgical History:  Procedure Laterality Date  . COLONOSCOPY WITH PROPOFOL N/A 07/23/2017   Procedure: COLONOSCOPY WITH PROPOFOL;  Surgeon: Juanita Craver, MD;  Location: [facility] END[facility]COPY;  Service: Endoscopy;  Laterality: N/A;  . CORONARY ANGIOPLASTY WITH STENT PLACEMENT  2008  . CORONARY ARTERY BYPASS GRAFT  1992    SOCIAL HISTORY: History   Social History  . Marital Status: Married    Spouse Name: N/A  . Number of Children: 3  . Years of Education: N/A   Occupational History  . Worked for [employer]   Social History Main Topics  . Smoking status: Never Smoker   . Smokeless tobacco: Not on file  . Alcohol Use: No  . Drug Use: No  . Sexual Activity: Not on file   Other Topics Concern  . Not on file   Social History Narrative  . No narrative  on file    FAMILY HISTORY: Family History  Problem Relation Age of Onset  . Heart failure Mother   . Heart failure Father   . Diabetes Sister   . Congenital heart disease Sister   . Cancer Brother 32       prostate and colon cancer     ALLERGIES:  has No Known Allergies.  MEDICATIONS:  Current Outpatient Medications  Medication Sig Dispense Refill  . Cholecalciferol (VITAMIN D3) 5000 UNITS CAPS Take 5,000 Units by mouth daily.    . Cyanocobalamin (B-12) 5000 MCG CAPS Take 1 capsule by mouth daily.    . furosemide (LASIX) 20 MG tablet Take 20 mg by mouth  daily as needed. Reported on 08/03/2015  0  . hydrALAZINE (APRESOLINE) 50 MG tablet TK 1 T PO TID  3  . Insulin Glargine (TOUJEO SOLOSTAR) 300 UNIT/ML SOPN Inject 46 Units into the skin at bedtime.     Marland Kitchen linagliptin (TRADJENTA) 5 MG TABS tablet Take 5 mg by mouth daily.    Marland Kitchen lisinopril (PRINIVIL,ZESTRIL) 40 MG tablet Take 40 mg by mouth daily.     . Magnesium 250 MG TABS Take 2 tablets by mouth daily.    . metoprolol (LOPRESSOR) 50 MG tablet Take 50 mg by mouth 2 (two) times daily.    . NON FORMULARY Take 12.5 mg by mouth daily. Iodine complex    . NON FORMULARY Take 5,000 mcg by mouth daily. Folate    . pravastatin (PRAVACHOL) 20 MG tablet Take 1 tablet by mouth at bedtime.  2  . rivaroxaban (XARELTO) 20 MG TABS tablet Take 20 mg by mouth daily with supper.    . timolol (TIMOPTIC) 0.5 % ophthalmic solution Place 1 drop into both eyes daily.    . Travoprost, BAK Free, (TRAVATAN) 0.004 % SOLN ophthalmic solution Place 1 drop into both eyes at bedtime.     . Ascorbic Acid (VITAMIN C) 1000 MG tablet Take 1,000 mg by mouth daily.    . Multiple Vitamins-Minerals (MULTIVITAMIN ADULT PO) Take 1 tablet by mouth daily.    . nitroGLYCERIN (NITROSTAT) 0.4 MG SL tablet Place 0.4 mg under the tongue every 5 (five) minutes as needed for chest pain. Reported on 08/03/2015     No current facility-administered medications for this visit.     REVIEW OF SYSTEMS:   Constitutional: Denies fevers, chills or abnormal night sweats (+) weight gain Eyes: Denies blurriness of vision, double vision or watery eyes Ears, nose, mouth, throat, and face: Denies mucositis or sore throat Respiratory: Denies cough, dyspnea or wheezes Cardiovascular: Denies palpitation, chest discomfort or lower extremity swelling Gastrointestinal:  Denies nausea, heartburn or change in bowel habits Skin: Denies abnormal skin rashes Lymphatics: Denies new lymphadenopathy or easy bruising Neurological:Denies numbness, tingling or new  weaknesses MSK: (+) back and bilateral hip pain, ambulates with walker Behavioral/Psych: Mood is stable, no new changes  All other systems were reviewed with the patient and are negative.  PHYSICAL EXAMINATION:  ECOG PERFORMANCE STATUS: 2  Vitals:   09/10/17 1322  BP: (!) 157/76  Pulse: 70  Resp: 16  Temp: 98.3 F (36.8 C)  SpO2: 100%   Filed Weights   09/10/17 1322  Weight: 187 lb 9.6 oz (85.1 kg)    GENERAL:alert, no distress and comfortable SKIN: skin color, texture, turgor are normal, no rashes or significant lesions EYES: normal, conjunctiva are pink and non-injected, sclera clear OROPHARYNX:no exudate, no erythema and lips, buccal mucosa, and tongue normal  NECK: supple,  thyroid normal size, non-tender, without nodularity LYMPH:  no palpable lymphadenopathy in the cervical, axillary or inguinal LUNGS: clear to auscultation and percussion with normal breathing effort HEART: regular rate & rhythm and no murmurs and no lower extremity edema ABDOMEN:abdomen soft, non-tender and normal bowel sounds Musculoskeletal:no cyanosis of digits and no clubbing  PSYCH: alert & oriented x 3 with fluent speech NEURO: no focal motor/sensory deficits  LABORATORY DATA:  I have reviewed the data as listed CBC Latest Ref Rng & Units 09/10/2017 09/11/2016 02/20/2016  WBC 4.0 - 10.3 K/uL 8.6 7.9 7.6  Hemoglobin 13.0 - 17.1 g/dL 13.4 13.3 13.0  Hematocrit 38.4 - 49.9 % 39.7 39.3 37.4(L)  Platelets 140 - 400 K/uL 188 165 150   Recent Labs    09/11/16 1236 09/11/16 1354 09/10/17 1234  NA 140  --  139  K 4.4  --  4.1  CL  --   --  107  CO2 24  --  23  GLUCOSE 191*  --  155*  BUN 20.2  --  18  CREATININE 1.4*  --  1.49*  CALCIUM 9.5  --  9.3  GFRNONAA  --   --  44*  GFRAA  --   --  50*  PROT 7.8 7.2 7.8  ALBUMIN 3.7  --  3.6  AST 20  --  20  ALT 22  --  20  ALKPHOS 69  --  66  BILITOT 0.71  --  0.5   MGUS LAB: M-protein  12/05/2014: 0.6 05/04/15: 0.6 08/03/2015: 0.6 09/11/16:  0.5 09/10/17: PENDING   IgG 12/05/14 1880 08/03/2015: 1625 3/15/2-18: 1679 09/10/17: PENDING     KAPPA, LAMBDA light chains and rat 12/05/2014 and are : 2.76, 1.56, 1.77 05/04/2015: 4.14, 1.69, 2.45 08/03/2015: 2.54, 1.25, 2.04 09/11/2016: 3.96, 1.66, 2.39 09/10/17: PENDING   24 hour UPEP/IFE 12/08/2014 Comments: No monoclonal free light chains (Bence Jones Protein) are detected.Urine IFE shows polyclonal increase in free Kappa and/or free Lambdalight chains.  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 - SEE COMMENT. Diagnosis Note The bone marrow is normocellular, however, there is a mild increase in plasma cells (9% by aspirate, 5-10% by CD138). Light chain immunohistochemistry reveals an apparent kappa excess. 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 Correlation with clinical, laboratory, and radiographic data is required for further classification.  RADIOGRAPHIC STUDIES: I have personally reviewed the radiological images as listed and agreed with the findings in the report.  Dg Bone Survey Met  12/12/2014   CLINICAL DATA:  Monoclonal gammopathy of uncertain significance. Question multiple myeloma.  EXAM: METASTATIC BONE SURVEY  COMPARISON:  None.  FINDINGS: A single lucent lesion on the lateral view of the skull measuring 0.7 cm is identified. 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 No other lytic lesion is seen. The patient has severe degenerative disease about the hips. 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 The patient is status post CABG. The lungs are clear. Scattered mild appearing spondylosis is seen about the spine. Bilateral iliac stents are in place.  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 No convincing evidence of multiple myeloma is identified.  Severe degenerative disease about the hips.   Electronically Signed   By: Inge Rise M.D.   On:  12/12/2014 13:57     ASSESSMENT & PLAN:  77 y.o.  male with past medical history of diabetes and chronic kidney disease  1. IgG MGUS -This was discovered by lab test. He is asymptomatic except chronic left hip pain from arthritis. -I previously reviewed his bone marrow biopsy results, which showed 5-10% of plasma cell. -I previously  reviewed his lab results. SPEP showed monoclonal IgG gammopathy, with low M protein level (0.5-0.6g/dl), serum free Kappa light chain is slightly elevated, light chain ratio was normal. -His initial lab showed normal CBC, CMP showed a normal calcium level, creatinine 1.44. He does have long-standing history of HTN and diabetes, which can cause chronic kidney disease. -A bone survey was negative for lytic lesions.  -Based on the above test results, he has IgG MGUS. No evidence of multiple myeloma. -I previously reviewed the natural history of MGUS, and the risk of progressing to multiple myeloma (1% per year). He scan protein has been remained the same since his diagnosis, his risk is probably low. I recommend surveillance with labs and office visit every 6 months. No treatment is needed. -due to his stable and low level M-protein, I have been seeing him once a year  -he had a colonoscopy by Dr. Collene Mares on 07/23/17. 6 small polyps and small internal hemorrhoids were found. The polyps were removed and biopsies. Pathology results showed no evidence of malignancy.  -Labs reviewed, CBC WNL, Cr stable at 1.49 and non-fasting glucose at 155. His MM panels were stab le last year, today's are still pending. Will copy lab results to Dr. Merry Proud.  -His clinical doing well and stable, no concern for multiple myeloma, we'll continue monitoring. -F/u yearly.    2. HTN, DM, CAD, CKD -He will continue  follow-up with his primary care physician endocrinologist and nephrologist. -We discussed his recent cardiac workup which showed 3 occluded arteries. He will continue to follow his cardiologist and look for possible medical management.   3. Bilateral hip arthritis and back pain  -He will continue follow-up with his orthopedic surgeon, he has not decided if he wants total hip replacement. -Given his heart condition he is not cleared for hip replacement. He ambulates with a walker and have gained weight given his inactivity.  -I strongly encouraged him to be active and exercise along with PT -I suggest steroid injection or acupuncture to help control his pain to make it easier for him to move around. I provided him with acupuncture coupon today.    Plan -lab and f/u in 1 year -I will call him next week regarding his SPEP results results.  All questions were answered. The patient knows to call the clinic with any problems, questions or concerns.  I spent 15 minutes counseling the patient face to face. The total time spent in the appointment was 20 minutes and more than 50% was on counseling.   Dustin Merle, MD 09/10/2017   This document serves as a record of services personally performed by Dustin Merle, MD. It was created on her behalf by Joslyn Devon, a trained medical scribe. The creation of this record is based on the scribe's personal observations and the provider's statements to them.    I have reviewed the above documentation for accuracy and completeness, and I agree with the above.

## 2017-09-10 ENCOUNTER — Encounter: Payer: Self-pay | Admitting: Hematology

## 2017-09-10 ENCOUNTER — Inpatient Hospital Stay: Payer: Medicare Other

## 2017-09-10 ENCOUNTER — Inpatient Hospital Stay: Payer: Medicare Other | Attending: Hematology | Admitting: Hematology

## 2017-09-10 VITALS — BP 157/76 | HR 70 | Temp 98.3°F | Resp 16 | Ht 66.5 in | Wt 187.6 lb

## 2017-09-10 DIAGNOSIS — I2510 Atherosclerotic heart disease of native coronary artery without angina pectoris: Secondary | ICD-10-CM | POA: Insufficient documentation

## 2017-09-10 DIAGNOSIS — M160 Bilateral primary osteoarthritis of hip: Secondary | ICD-10-CM | POA: Insufficient documentation

## 2017-09-10 DIAGNOSIS — E119 Type 2 diabetes mellitus without complications: Secondary | ICD-10-CM | POA: Diagnosis not present

## 2017-09-10 DIAGNOSIS — D472 Monoclonal gammopathy: Secondary | ICD-10-CM | POA: Diagnosis present

## 2017-09-10 DIAGNOSIS — N189 Chronic kidney disease, unspecified: Secondary | ICD-10-CM | POA: Diagnosis not present

## 2017-09-10 DIAGNOSIS — I10 Essential (primary) hypertension: Secondary | ICD-10-CM | POA: Diagnosis not present

## 2017-09-10 DIAGNOSIS — M549 Dorsalgia, unspecified: Secondary | ICD-10-CM | POA: Insufficient documentation

## 2017-09-10 LAB — CBC WITH DIFFERENTIAL/PLATELET
Basophils Absolute: 0.1 10*3/uL (ref 0.0–0.1)
Basophils Relative: 1 %
EOS ABS: 0.2 10*3/uL (ref 0.0–0.5)
EOS PCT: 2 %
HCT: 39.7 % (ref 38.4–49.9)
Hemoglobin: 13.4 g/dL (ref 13.0–17.1)
Lymphocytes Relative: 23 %
Lymphs Abs: 2 10*3/uL (ref 0.9–3.3)
MCH: 30.6 pg (ref 27.2–33.4)
MCHC: 33.8 g/dL (ref 32.0–36.0)
MCV: 90.8 fL (ref 79.3–98.0)
MONO ABS: 0.8 10*3/uL (ref 0.1–0.9)
MONOS PCT: 10 %
Neutro Abs: 5.6 10*3/uL (ref 1.5–6.5)
Neutrophils Relative %: 64 %
PLATELETS: 188 10*3/uL (ref 140–400)
RBC: 4.38 MIL/uL (ref 4.20–5.82)
RDW: 14.2 % (ref 11.0–14.6)
WBC: 8.6 10*3/uL (ref 4.0–10.3)

## 2017-09-10 LAB — COMPREHENSIVE METABOLIC PANEL
ALT: 20 U/L (ref 0–55)
ANION GAP: 9 (ref 3–11)
AST: 20 U/L (ref 5–34)
Albumin: 3.6 g/dL (ref 3.5–5.0)
Alkaline Phosphatase: 66 U/L (ref 40–150)
BUN: 18 mg/dL (ref 7–26)
CHLORIDE: 107 mmol/L (ref 98–109)
CO2: 23 mmol/L (ref 22–29)
Calcium: 9.3 mg/dL (ref 8.4–10.4)
Creatinine, Ser: 1.49 mg/dL — ABNORMAL HIGH (ref 0.70–1.30)
GFR calc non Af Amer: 44 mL/min — ABNORMAL LOW (ref >60)
GFR, EST AFRICAN AMERICAN: 50 mL/min — AB (ref >60)
Glucose, Bld: 155 mg/dL — ABNORMAL HIGH (ref 70–140)
POTASSIUM: 4.1 mmol/L (ref 3.5–5.1)
SODIUM: 139 mmol/L (ref 136–145)
Total Bilirubin: 0.5 mg/dL (ref 0.2–1.2)
Total Protein: 7.8 g/dL (ref 6.4–8.3)

## 2017-09-11 LAB — KAPPA/LAMBDA LIGHT CHAINS
KAPPA FREE LGHT CHN: 47.3 mg/L — AB (ref 3.3–19.4)
Kappa, lambda light chain ratio: 3.01 — ABNORMAL HIGH (ref 0.26–1.65)
Lambda free light chains: 15.7 mg/L (ref 5.7–26.3)

## 2017-09-14 LAB — MULTIPLE MYELOMA PANEL, SERUM
Albumin SerPl Elph-Mcnc: 3.3 g/dL (ref 2.9–4.4)
Albumin/Glob SerPl: 0.9 (ref 0.7–1.7)
Alpha 1: 0.3 g/dL (ref 0.0–0.4)
Alpha2 Glob SerPl Elph-Mcnc: 0.7 g/dL (ref 0.4–1.0)
B-GLOBULIN SERPL ELPH-MCNC: 1.2 g/dL (ref 0.7–1.3)
Gamma Glob SerPl Elph-Mcnc: 2 g/dL — ABNORMAL HIGH (ref 0.4–1.8)
Globulin, Total: 4.1 g/dL — ABNORMAL HIGH (ref 2.2–3.9)
IgA: 324 mg/dL (ref 61–437)
IgG (Immunoglobin G), Serum: 1936 mg/dL — ABNORMAL HIGH (ref 700–1600)
IgM (Immunoglobulin M), Srm: 40 mg/dL (ref 15–143)
M PROTEIN SERPL ELPH-MCNC: 0.9 g/dL — AB
TOTAL PROTEIN ELP: 7.4 g/dL (ref 6.0–8.5)

## 2017-09-16 ENCOUNTER — Telehealth: Payer: Self-pay

## 2017-09-16 NOTE — Telephone Encounter (Signed)
Notes on file in chart prep rj.Marland KitchenMarland KitchenMarland KitchenMarland Kitchen

## 2017-09-17 ENCOUNTER — Telehealth: Payer: Self-pay | Admitting: *Deleted

## 2017-09-17 NOTE — Telephone Encounter (Signed)
TCT patient regarding lab results from 1 week.  Advised patient that some results were elevated (M-protein) and that Dr. Burr Medico wanted to repeat them in 3-4 months. Pt is agreeable to this. Advised him that a scheduler would call him with date and time for next appt.

## 2017-09-18 ENCOUNTER — Telehealth: Payer: Self-pay | Admitting: Hematology

## 2017-09-18 LAB — HEMOGLOBIN A1C: Hemoglobin A1C: 7.3

## 2017-09-18 NOTE — Telephone Encounter (Signed)
Appointment scheduled per 3/21 sch msg/ letter/calendar mailed to patient

## 2017-09-30 ENCOUNTER — Ambulatory Visit (INDEPENDENT_AMBULATORY_CARE_PROVIDER_SITE_OTHER): Payer: Medicare Other | Admitting: "Endocrinology

## 2017-09-30 ENCOUNTER — Encounter: Payer: Self-pay | Admitting: "Endocrinology

## 2017-09-30 VITALS — BP 137/66 | HR 69 | Ht 66.5 in | Wt 187.0 lb

## 2017-09-30 DIAGNOSIS — E1159 Type 2 diabetes mellitus with other circulatory complications: Secondary | ICD-10-CM | POA: Diagnosis not present

## 2017-09-30 DIAGNOSIS — I10 Essential (primary) hypertension: Secondary | ICD-10-CM | POA: Diagnosis not present

## 2017-09-30 DIAGNOSIS — N183 Chronic kidney disease, stage 3 (moderate): Secondary | ICD-10-CM

## 2017-09-30 DIAGNOSIS — Z794 Long term (current) use of insulin: Secondary | ICD-10-CM

## 2017-09-30 DIAGNOSIS — E782 Mixed hyperlipidemia: Secondary | ICD-10-CM | POA: Diagnosis not present

## 2017-09-30 DIAGNOSIS — E1122 Type 2 diabetes mellitus with diabetic chronic kidney disease: Secondary | ICD-10-CM | POA: Diagnosis not present

## 2017-09-30 NOTE — Patient Instructions (Signed)

## 2017-09-30 NOTE — Progress Notes (Signed)
Endocrinology Consult Note       09/30/2017, 6:00 PM   Subjective:    Patient ID: Dustin [name], male    DOB: Jan 13, 1941.  Dustin [name] is being seen in consultation for management of currently uncontrolled symptomatic diabetes requested by  Robley Fries, MD.   Past Medical History:  Diagnosis Date  . A-fib ([place])   . Chronic kidney disease   . Diabetes mellitus without complication ([place])   . Hypertension   . PVD (peripheral vascular disease) ([place]) 2008   stent placement in legs   . Sleep apnea    Past Surgical History:  Procedure Laterality Date  . COLONOSCOPY WITH PROPOFOL N/A 07/23/2017   Procedure: COLONOSCOPY WITH PROPOFOL;  Surgeon: Juanita Craver, MD;  Location: [facility];  Service: Endoscopy;  Laterality: N/A;  . CORONARY ANGIOPLASTY WITH STENT PLACEMENT  2008  . CORONARY ARTERY BYPASS GRAFT  1992   Social History   Socioeconomic History  . Marital status: Married    Spouse name: Not on file  . Number of children: Not on file  . Years of education: Not on file  . Highest education level: Not on file  Occupational History  . Not on file  Social Needs  . Financial resource strain: Not on file  . Food insecurity:    Worry: Not on file    Inability: Not on file  . Transportation needs:    Medical: Not on file    Non-medical: Not on file  Tobacco Use  . Smoking status: Never Smoker  . Smokeless tobacco: Never Used  Substance and Sexual Activity  . Alcohol use: No  . Drug use: No  . Sexual activity: Not on file  Lifestyle  . Physical activity:    Days per week: Not on file    Minutes per session: Not on file  . Stress: Not on file  Relationships  . Social connections:    Talks on phone: Not on file    Gets together: Not on file    Attends religious service: Not on file    Active member of club or organization: Not on file    Attends meetings of clubs or organizations: Not on  file    Relationship status: Not on file  Other Topics Concern  . Not on file  Social History Narrative  . Not on file   Outpatient Encounter Medications as of 09/30/2017  Medication Sig  . aspirin EC 81 MG tablet Take 81 mg by mouth daily.  . Ascorbic Acid (VITAMIN C) 1000 MG tablet Take 1,000 mg by mouth daily.  . Cholecalciferol (VITAMIN D3) 5000 UNITS CAPS Take 5,000 Units by mouth daily.  . Cyanocobalamin (B-12) 5000 MCG CAPS Take 1 capsule by mouth daily.  . furosemide (LASIX) 20 MG tablet Take 20 mg by mouth daily as needed. Reported on 08/03/2015  . hydrALAZINE (APRESOLINE) 50 MG tablet TK 1 T PO TID  . Insulin Glargine (TOUJEO SOLOSTAR) 300 UNIT/ML SOPN Inject 46 Units into the skin at bedtime.   Marland Kitchen linagliptin (TRADJENTA) 5 MG TABS tablet Take 5 mg by mouth daily.  Marland Kitchen lisinopril (PRINIVIL,ZESTRIL) 40  MG tablet Take 40 mg by mouth daily.   . Magnesium 250 MG TABS Take 2 tablets by mouth daily.  . metoprolol (LOPRESSOR) 50 MG tablet Take 50 mg by mouth 2 (two) times daily.  . Multiple Vitamins-Minerals (MULTIVITAMIN ADULT PO) Take 1 tablet by mouth daily.  . nitroGLYCERIN (NITROSTAT) 0.4 MG SL tablet Place 0.4 mg under the tongue every 5 (five) minutes as needed for chest pain. Reported on 08/03/2015  . NON FORMULARY Take 12.5 mg by mouth daily. Iodine complex  . NON FORMULARY Take 5,000 mcg by mouth daily. Folate  . pravastatin (PRAVACHOL) 20 MG tablet Take 1 tablet by mouth at bedtime.  . rivaroxaban (XARELTO) 20 MG TABS tablet Take 20 mg by mouth daily with supper.  . timolol (TIMOPTIC) 0.5 % ophthalmic solution Place 1 drop into both eyes daily.  . Travoprost, BAK Free, (TRAVATAN) 0.004 % SOLN ophthalmic solution Place 1 drop into both eyes at bedtime.    No facility-administered encounter medications on file as of 09/30/2017.     ALLERGIES: No Known Allergies  VACCINATION STATUS: Immunization History  Administered Date(s) Administered  . Influenza Whole 03/30/2009  .  Influenza-Unspecified 03/21/2015    Diabetes  He presents for his initial diabetic visit. He has type 2 diabetes mellitus. Onset time: Patient was diagnosed at approximate age of 50 years. His disease course has been stable. There are no hypoglycemic associated symptoms. Pertinent negatives for hypoglycemia include no confusion, headaches, pallor or seizures. There are no diabetic associated symptoms. Pertinent negatives for diabetes include no chest pain, no fatigue, no polydipsia, no polyphagia, no polyuria and no weakness. There are no hypoglycemic complications. Symptoms are stable. Diabetic complications include heart disease and nephropathy. Risk factors for coronary artery disease include diabetes mellitus, dyslipidemia, family history, hypertension, male sex, obesity and sedentary lifestyle. Current diabetic treatment includes insulin injections and oral agent (monotherapy). He is compliant with treatment most of the time. His weight is stable. He is following a generally unhealthy diet. When asked about meal planning, he reported none. He has not had a previous visit with a dietitian. He rarely (He has severe bilateral hip arthritis being considered for hip replacement, walks with walker, limited range of motion.) participates in exercise. (Patient did not bring any meter or logs to review.  His recent A1c was 7.3% on September 18, 2017.) An ACE inhibitor/angiotensin II receptor blocker is being taken. He sees a podiatrist.Eye exam is current.  Hyperlipidemia  This is a chronic problem. The current episode started more than 1 year ago. Exacerbating diseases include diabetes. Pertinent negatives include no chest pain, myalgias or shortness of breath. Risk factors for coronary artery disease include diabetes mellitus, dyslipidemia, male sex, a sedentary lifestyle and hypertension.  Hypertension  This is a chronic problem. The current episode started more than 1 year ago. The problem is controlled.  Pertinent negatives include no chest pain, headaches, neck pain, palpitations or shortness of breath. Risk factors for coronary artery disease include dyslipidemia, diabetes mellitus, male gender, sedentary lifestyle and family history. Past treatments include ACE inhibitors.     Review of Systems  Constitutional: Negative for chills, fatigue, fever and unexpected weight change.  HENT: Negative for dental problem, mouth sores and trouble swallowing.   Eyes: Negative for visual disturbance.  Respiratory: Negative for cough, choking, chest tightness, shortness of breath and wheezing.   Cardiovascular: Negative for chest pain, palpitations and leg swelling.  Gastrointestinal: Negative for abdominal distention, abdominal pain, constipation, diarrhea, nausea and vomiting.  Endocrine: Negative for polydipsia, polyphagia and polyuria.  Genitourinary: Negative for dysuria, flank pain, hematuria and urgency.  Musculoskeletal: Positive for gait problem. Negative for back pain, myalgias and neck pain.       Walks with a walker due to bilateral hip arthritis being considered for hip replacement.  Skin: Negative for pallor, rash and wound.  Neurological: Negative for seizures, syncope, weakness, numbness and headaches.  Psychiatric/Behavioral: Negative for confusion and dysphoric mood.    Objective:    BP 137/66   Pulse 69   Ht 5' 6.5" (1.689 m)   Wt 187 lb (84.8 kg)   BMI 29.73 kg/m   Wt Readings from Last 3 Encounters:  09/30/17 187 lb (84.8 kg)  09/10/17 187 lb 9.6 oz (85.1 kg)  07/22/17 182 lb (82.6 kg)     Physical Exam  Constitutional: He is oriented to person, place, and time. He appears well-developed. He is cooperative. No distress.  HENT:  Head: Normocephalic and atraumatic.  Eyes: EOM are normal.  Neck: Normal range of motion. Neck supple. No tracheal deviation present. No thyromegaly present.  Cardiovascular: Normal rate, S1 normal, S2 normal and normal heart sounds. Exam  reveals no gallop.  No murmur heard. Pulses:      Dorsalis pedis pulses are 1+ on the right side, and 1+ on the left side.       Posterior tibial pulses are 1+ on the right side, and 1+ on the left side.  Pulmonary/Chest: Breath sounds normal. No respiratory distress. He has no wheezes.  Abdominal: Soft. Bowel sounds are normal. He exhibits no distension. There is no tenderness. There is no guarding and no CVA tenderness.  Musculoskeletal: He exhibits no edema.       Right shoulder: He exhibits no swelling and no deformity.  Walks with a walker due to bilateral hip arthritis being considered for hip replacement.  Neurological: He is alert and oriented to person, place, and time. He has normal strength and normal reflexes. No cranial nerve deficit or sensory deficit. Gait normal.  Skin: Skin is warm and dry. No rash noted. No cyanosis. Nails show no clubbing.  Psychiatric: He has a normal mood and affect. His speech is normal and behavior is normal. Judgment and thought content normal. Cognition and memory are normal.   Recent Results (from the past 2160 hour(s))  Glucose, capillary     Status: Abnormal   Collection Time: 07/23/17  7:08 AM  Result Value Ref Range   Glucose-Capillary 157 (H) 65 - 99 mg/dL  Multiple Myeloma Panel (SPEP&IFE w/QIG)     Status: Abnormal   Collection Time: 09/10/17 12:30 PM  Result Value Ref Range   IgG (Immunoglobin G), Serum 1,936 (H) 700 - 1,600 mg/dL   IgA 324 61 - 437 mg/dL   IgM (Immunoglobulin M), Srm 40 15 - 143 mg/dL   Total Protein ELP 7.4 6.0 - 8.5 g/dL   Albumin SerPl Elph-Mcnc 3.3 2.9 - 4.4 g/dL   Alpha 1 0.3 0.0 - 0.4 g/dL   Alpha2 Glob SerPl Elph-Mcnc 0.7 0.4 - 1.0 g/dL   B-Globulin SerPl Elph-Mcnc 1.2 0.7 - 1.3 g/dL   Gamma Glob SerPl Elph-Mcnc 2.0 (H) 0.4 - 1.8 g/dL   M Protein SerPl Elph-Mcnc 0.9 (H) Not Observed g/dL   Globulin, Total 4.1 (H) 2.2 - 3.9 g/dL   Albumin/Glob SerPl 0.9 0.7 - 1.7   IFE 1 Comment     Comment:  (NOTE) 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 Please note that samples from patients receiving DARZALEX(R) (daratumumab) treatment can appear as an "IgG kappa" and mask a complete response. If this patient is receiving DARA, this IFE assay interference can be removed by ordering test number [id number]-"Immunofixation, Daratumumab-Specific, Serum" and submitting a new sample for testing or by calling the lab to add this test to the current sample.    Please Note Comment     Comment: (NOTE) Protein electrophoresis scan will follow via computer, mail, or courier delivery. Performed At: [facility] [place], [place] [id number] Rush Farmer MD UU:[id number] Performed at [facility], [place] [address] [place], [place] [address]   Kappa/lambda light chains     Status: Abnormal   Collection Time: 09/10/17 12:30 PM  Result Value Ref Range   Kappa free light chain 47.3 (H) 3.3 - 19.4 mg/L   Lamda free light chains 15.7 5.7 - 26.3 mg/L   Kappa, lamda light chain ratio 3.01 (H) 0.26 - 1.65    Comment: (NOTE) Performed At: [facility] [place], [place] [id number] Rush Farmer MD PP:[id number] Performed at [facility], [place] [address] [place], [place] [address]   CBC with Differential     Status: None   Collection Time: 09/10/17 12:30 PM  Result Value Ref Range   WBC 8.6 4.0 - 10.3 K/uL   RBC 4.38 4.20 - 5.82 MIL/uL   Hemoglobin 13.4 13.0 - 17.1 g/dL   HCT 39.7 38.4 - 49.9 %   MCV 90.8 79.3 - 98.0 fL   MCH 30.6 27.2 - 33.4 pg   MCHC 33.8 32.0 - 36.0 g/dL   RDW 14.2 11.0 - 14.6 %   Platelets 188 140 - 400 K/uL   Neutrophils Relative % 64 %   Neutro Abs 5.6 1.5 - 6.5 K/uL   Lymphocytes Relative 23 %   Lymphs Abs 2.0 0.9 - 3.3 K/uL   Monocytes Relative 10 %   Monocytes Absolute 0.8 0.1 - 0.9 K/uL   Eosinophils Relative 2 %   Eosinophils Absolute  0.2 0.0 - 0.5 K/uL   Basophils Relative 1 %   Basophils Absolute 0.1 0.0 - 0.1 K/uL    Comment: Performed at [facility], [place] [address] [place], [place] [address]  Comprehensive metabolic panel     Status: Abnormal   Collection Time: 09/10/17 12:34 PM  Result Value Ref Range   Sodium 139 136 - 145 mmol/L   Potassium 4.1 3.5 - 5.1 mmol/L   Chloride 107 98 - 109 mmol/L   CO2 23 22 - 29 mmol/L   Glucose, Bld 155 (H) 70 - 140 mg/dL   BUN 18 7 - 26 mg/dL   Creatinine, Ser 1.49 (H) 0.70 - 1.30 mg/dL   Calcium 9.3 8.4 - 10.4 mg/dL   Total Protein 7.8 6.4 - 8.3 g/dL   Albumin 3.6 3.5 - 5.0 g/dL   AST 20 5 - 34 U/L   ALT 20 0 - 55 U/L   Alkaline Phosphatase 66 40 - 150 U/L   Total Bilirubin 0.5 0.2 - 1.2 mg/dL   GFR calc non Af Amer 44 (L) >60 mL/min   GFR calc Af Amer 50 (L) >60 mL/min    Comment: (NOTE) The eGFR has been calculated using the CKD EPI equation. This calculation has not been validated in all clinical situations. eGFR's persistently <60 mL/min signify possible Chronic Kidney Disease.    Anion gap 9 3 -  11    Comment: Performed at [facility], [place] [address] [place], [place] [address]  Hemoglobin A1c     Status: None   Collection Time: 09/18/17 12:00 AM  Result Value Ref Range   Hemoglobin A1C 7.3    CMP ( most recent) CMP     Component Value Date/Time   NA 139 09/10/2017 1234   NA 140 09/11/2016 1236   K 4.1 09/10/2017 1234   K 4.4 09/11/2016 1236   CL 107 09/10/2017 1234   CO2 23 09/10/2017 1234   CO2 24 09/11/2016 1236   GLUCOSE 155 (H) 09/10/2017 1234   GLUCOSE 191 (H) 09/11/2016 1236   BUN 18 09/10/2017 1234   BUN 20.2 09/11/2016 1236   CREATININE 1.49 (H) 09/10/2017 1234   CREATININE 1.4 (H) 09/11/2016 1236   CALCIUM 9.3 09/10/2017 1234   CALCIUM 9.5 09/11/2016 1236   PROT 7.8 09/10/2017 1234   PROT 7.2 09/11/2016 1354   PROT 7.8 09/11/2016 1236   ALBUMIN 3.6 09/10/2017 1234   ALBUMIN 3.7  09/11/2016 1236   AST 20 09/10/2017 1234   AST 20 09/11/2016 1236   ALT 20 09/10/2017 1234   ALT 22 09/11/2016 1236   ALKPHOS 66 09/10/2017 1234   ALKPHOS 69 09/11/2016 1236   BILITOT 0.5 09/10/2017 1234   BILITOT 0.71 09/11/2016 1236   GFRNONAA 44 (L) 09/10/2017 1234   GFRAA 50 (L) 09/10/2017 1234   Lipid Panel     Component Value Date/Time   CHOL 204 (A) 10/13/2016   TRIG 165 (A) 10/13/2016   HDL 40 10/13/2016   LDLCALC 131 10/13/2016    A1c was 7.3% on September 18, 2017.  Assessment & Plan:   1. DM type 2 causing vascular disease status post coronary artery bypass graft and stage 3 renal insufficiency. - Dustin [name] has currently uncontrolled symptomatic type 2 DM since 77 years of age,  with most recent A1c of 7.3 %. Recent labs reviewed.  -his diabetes is complicated by coronary artery disease status post coronary artery bypass graft in 1992, stage III renal insufficiency, sedentary life due to severe bilateral hip arthritis and Dustin [name] remains at a high risk for more acute and chronic complications which include CAD, CVA, CKD, retinopathy, and neuropathy. These are all discussed in detail with the patient.  - I have counseled him on diet management and weight loss, by adopting a carbohydrate restricted/protein rich diet.  - Suggestion is made for him to avoid simple carbohydrates  from his diet including Cakes, Sweet Desserts, Ice Cream, Soda (diet and regular), Sweet Tea, Candies, Chips, Cookies, Store Bought Juices, Alcohol in Excess of  1-2 drinks a day, Artificial Sweeteners, and "Sugar-free" Products. This will help patient to have stable blood glucose profile and potentially avoid unintended weight gain.  - I encouraged him to switch to  unprocessed or minimally processed complex starch and increased protein intake (animal or plant source), fruits, and vegetables.  - he is advised to stick to a routine mealtimes to eat 3 meals  a day and avoid unnecessary snacks (  to snack only to correct hypoglycemia).   - he will be scheduled with Jearld Fenton, RDN, CDE for individualized diabetes education.  - I have approached him with the following individualized plan to manage diabetes and patient agrees:   -Based on his current A1c of 7.3% he would not require additional treatment to control diabetes.   - I  will proceed with his current basal  insulin of Toujeo at 46 units nightly, associated with strict monitoring of blood glucose 2 times a day-daily before breakfast and at bedtime.   - Patient is warned not to take insulin without proper monitoring per orders. -Patient is encouraged to call clinic for blood glucose levels less than 70 or above 200 mg /dl. - I will continue Tradjenta 5 mg p.o. every morning, therapeutically suitable for patient .  -Patient is not a candidate for metformin, SGLT2 inhibitors due to CKD.  - he will be considered for weekly injectable incretin therapy as appropriate next visit. - Patient specific target  A1c;  LDL, HDL, Triglycerides, and  Waist Circumference were discussed in detail.  2) BP/HTN: His blood pressure is controlled to target. Continue current medications including ACEI/ARB. 3) Lipids/HPL:   No recent lipid panel to review.  His lipid panel from October 13, 2016 showed LDL of 131.   Patient is advised to continue statins. 4)  Weight/Diet: CDE Consult will be initiated , exercise, and detailed carbohydrates information provided.  5) Chronic Care/Health Maintenance:  -he  is on ACEI/ARB and Statin medications and  is encouraged to continue to follow up with Ophthalmology, Dentist,  Podiatrist at least yearly or according to recommendations, and advised to  stay away from smoking. I have recommended yearly flu vaccine and pneumonia vaccination at least every 5 years; moderate intensity exercise for up to 150 minutes weekly; and  sleep for at least 7 hours a day.  -From diabetes point of view, he is clear for planned  elective hip surgery. - I advised patient to maintain close follow up with Jaff, Salli Quarry, MD for primary care needs.  - Time spent with the patient: 45 minutes, of which >50% was spent in obtaining information about his symptoms, reviewing his previous labs, evaluations, and treatments, counseling him about his currently uncontrolled and complicated type 2 diabetes, hyperlipidemia, hypertension, and developing a plan to confirm the diagnosis and long term treatment as necessary.  Dustin [name] participated in the discussions, expressed understanding, and voiced agreement with the above plans.  All questions were answered to his satisfaction. he is encouraged to contact clinic should he have any questions or concerns prior to his return visit.  Follow up plan: - Return in about 3 months (around 12/30/2017) for follow up with pre-visit labs, meter, and logs.  Glade Lloyd, MD [facility] [address] [place], [place] [address] Phone: [phone]  Fax: [phone]    09/30/2017, 6:00 PM  This note was partially dictated with voice recognition software. Similar sounding words can be transcribed inadequately or may not  be corrected upon review.

## 2017-11-18 ENCOUNTER — Telehealth: Payer: Self-pay | Admitting: *Deleted

## 2017-11-18 NOTE — Telephone Encounter (Signed)
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  Called pt back for further details, but no answers.  Left message on voice mail requesting a call back to nurse. Pt's    Phone      [phone].

## 2017-11-19 ENCOUNTER — Encounter: Payer: Self-pay | Admitting: Hematology

## 2017-11-20 ENCOUNTER — Telehealth: Payer: Self-pay | Admitting: *Deleted

## 2017-11-20 NOTE — Telephone Encounter (Signed)
Dr. Burr Medico reviewed MyChart message.  Spoke with pt and wife.  Was informed that pt will have another CT scan and meeting with Dr. Estill Bakes, oncologist at [facility] on Tuesday  11/24/17.   Pt stated his hip surgery is tentatively scheduled for  12/08/17. Asked pt/wife to have scan results and labs faxed to our office for Dr. Burr Medico to review.  Gave wife direct nurse fax number - since some results are not available for viewing in [place].   Wife voiced understanding.

## 2017-11-25 DIAGNOSIS — D480 Neoplasm of uncertain behavior of bone and articular cartilage: Secondary | ICD-10-CM | POA: Insufficient documentation

## 2017-11-30 ENCOUNTER — Telehealth: Payer: Self-pay

## 2017-11-30 NOTE — Telephone Encounter (Signed)
I called back, no answers. I left VM and encouraged him to call back.   Truitt Merle MD

## 2017-11-30 NOTE — Telephone Encounter (Signed)
Call concerning information sent to Dr. Burr Medico from [facility].  [phone]

## 2017-12-01 ENCOUNTER — Telehealth: Payer: Self-pay

## 2017-12-01 NOTE — Telephone Encounter (Signed)
Zuhair Lariccia called wanting to speak to Dr. Burr Medico about any information from [facility] regarding this patient.  Please call back at [phone]

## 2017-12-04 ENCOUNTER — Telehealth: Payer: Self-pay

## 2017-12-04 ENCOUNTER — Inpatient Hospital Stay: Payer: Medicare Other | Attending: Hematology | Admitting: Hematology

## 2017-12-04 ENCOUNTER — Encounter: Payer: Self-pay | Admitting: Hematology

## 2017-12-04 VITALS — BP 143/73 | HR 77 | Temp 97.9°F | Resp 18 | Ht 66.5 in | Wt 185.4 lb

## 2017-12-04 DIAGNOSIS — M549 Dorsalgia, unspecified: Secondary | ICD-10-CM | POA: Diagnosis not present

## 2017-12-04 DIAGNOSIS — D472 Monoclonal gammopathy: Secondary | ICD-10-CM | POA: Diagnosis present

## 2017-12-04 DIAGNOSIS — M160 Bilateral primary osteoarthritis of hip: Secondary | ICD-10-CM | POA: Insufficient documentation

## 2017-12-04 DIAGNOSIS — E1122 Type 2 diabetes mellitus with diabetic chronic kidney disease: Secondary | ICD-10-CM | POA: Diagnosis not present

## 2017-12-04 DIAGNOSIS — I129 Hypertensive chronic kidney disease with stage 1 through stage 4 chronic kidney disease, or unspecified chronic kidney disease: Secondary | ICD-10-CM | POA: Diagnosis not present

## 2017-12-04 DIAGNOSIS — N189 Chronic kidney disease, unspecified: Secondary | ICD-10-CM | POA: Insufficient documentation

## 2017-12-04 DIAGNOSIS — M899 Disorder of bone, unspecified: Secondary | ICD-10-CM | POA: Insufficient documentation

## 2017-12-04 DIAGNOSIS — I2510 Atherosclerotic heart disease of native coronary artery without angina pectoris: Secondary | ICD-10-CM | POA: Diagnosis not present

## 2017-12-04 NOTE — Telephone Encounter (Signed)
Left message for Dr. Florian Buff to call Dr. Burr Medico on her cell (number provided).

## 2017-12-04 NOTE — Progress Notes (Signed)
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 male is here because of abnormal SPEP.  He has long-standing history of diabetes, and chronic kidney disease. He was recently referred to a nephrologist Dr. Posey Pronto on 11/01/2014. As part of her workup, SPEP and light chain level was checked. 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 Serum free coprolite chain was elevated at 39, lambda light chain was normal, ratio was 2.62.  He feels well, no pain, except sometime left hip pain when he walks, he uses a crane, no other pain. He has good appetite and energy, he is not very active due to the left hip pain.  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 He presents to the clinic today accompanied by his wife and his daughter today. He has been doing well overall.   He was seen at [facility] on 11/25/2017 for a evaluation of his hip pain and had a CT CAP w contrast completed with results showing:  Redemonstration of lucencies the bilateral iliac bones as seen on recent CT. Another small lesion is seen in the L1 vertebral body. As described, myeloma and metastases are considerations. MRI could be considered for further evaluation, as well as laboratory correlation. No other evidence of soft tissue neoplasm in the chest, abdomen, and pelvis.  He notes that he is having a lot of trouble with his right hip at this time and that his hip pain is alleviated with sitting. He notes that  when he goes to stand, he has to take a moment before getting up. He is able to walk for short distances, but he uses a walker. He uses OTC tylenol or ibuprofen for the relief of his symptoms. His surgeon wanted to have a vascular study completed by next week to ensure blood flow prior to the start of surgery.   On review of systems, he reports bilateral hip pain, right>left, occasional lower back pain, intermittent fatigue. he denies CP, lack of appetite, weight loss, and any other symptoms. Pertinent positives are listed and detailed within the above HPI.    MEDICAL HISTORY:  Past Medical History:  Diagnosis Date  . A-fib ([place])   . Chronic kidney disease   . Diabetes mellitus without complication ([place])   . Hypertension   . PVD (peripheral vascular disease) ([place]) 2008   stent placement in legs   . Sleep apnea     SURGICAL HISTORY: Past Surgical History:  Procedure Laterality Date  . COLONOSCOPY WITH PROPOFOL N/A 07/23/2017   Procedure: COLONOSCOPY WITH PROPOFOL;  Surgeon: Juanita Craver, MD;  Location: [facility];  Service: Endoscopy;  Laterality: N/A;  . CORONARY ANGIOPLASTY WITH STENT PLACEMENT  2008  . CORONARY ARTERY BYPASS GRAFT  1992    SOCIAL HISTORY: History   Social History  . Marital Status: Married    Spouse Name: N/A  . Number of Children: 3  . Years of Education: N/A   Occupational History  . Worked for [employer]   Social History Main  Topics  . Smoking status: Never Smoker   . Smokeless tobacco: Not on file  . Alcohol Use: No  . Drug Use: No  . Sexual Activity: Not on file   Other Topics Concern  . Not on file   Social History Narrative  . No narrative on file    FAMILY HISTORY: Family History  Problem Relation Age of Onset  . Heart failure Mother   . Heart failure Father   . Diabetes Sister   . Congenital heart disease Sister   . Cancer Brother 77       prostate and colon cancer     ALLERGIES:  has No Known  Allergies.  MEDICATIONS:  Current Outpatient Medications  Medication Sig Dispense Refill  . aspirin EC 81 MG tablet Take 81 mg by mouth daily.    . Cholecalciferol (VITAMIN D3) 5000 UNITS CAPS Take 10,000 Units by mouth daily.     . Cyanocobalamin (B-12) 5000 MCG CAPS Take 1 capsule by mouth 2 (two) times a week.     . furosemide (LASIX) 40 MG tablet Take 40 mg by mouth 2 (two) times daily.   0  . Insulin Glargine (TOUJEO SOLOSTAR) 300 UNIT/ML SOPN Inject 45 Units into the skin at bedtime.     . isosorbide-hydrALAZINE (BIDIL) 20-37.5 MG tablet Take 1 tablet by mouth 3 (three) times daily.    Marland Kitchen linagliptin (TRADJENTA) 5 MG TABS tablet Take 5 mg by mouth daily.    Marland Kitchen lisinopril (PRINIVIL,ZESTRIL) 40 MG tablet Take 40 mg by mouth daily.     . Magnesium 250 MG TABS Take 250 mg by mouth 2 (two) times daily.     . metoprolol (LOPRESSOR) 50 MG tablet Take 50 mg by mouth 2 (two) times daily.    . nitroGLYCERIN (NITROSTAT) 0.4 MG SL tablet Place 0.4 mg under the tongue every 5 (five) minutes as needed for chest pain. Reported on 08/03/2015    . NON FORMULARY Take 1 tablet by mouth daily. Folate     . OVER THE COUNTER MEDICATION Take 1 tablet by mouth 2 (two) times daily. Nerve Renew Supplement    . potassium chloride SA (K-DUR,KLOR-CON) 20 MEQ tablet Take 20 mEq by mouth 2 (two) times daily.    . rivaroxaban (XARELTO) 20 MG TABS tablet Take 20 mg by mouth daily with supper.    . rosuvastatin (CRESTOR) 20 MG tablet Take 20 mg by mouth at bedtime.    . timolol (TIMOPTIC) 0.5 % ophthalmic solution Place 1 drop into both eyes daily.    . Travoprost, BAK Free, (TRAVATAN) 0.004 % SOLN ophthalmic solution Place 1 drop into both eyes at bedtime.     . vitamin A 25000 UNIT capsule Take 25,000 Units by mouth daily.     No current facility-administered medications for this visit.     REVIEW OF SYSTEMS:   Constitutional: Denies fevers, chills or abnormal night sweats (+) weight gain Eyes: Denies blurriness of  vision, double vision or watery eyes Ears, nose, mouth, throat, and face: Denies mucositis or sore throat Respiratory: Denies cough, dyspnea or wheezes Cardiovascular: Denies palpitation, chest discomfort or lower extremity swelling Gastrointestinal:  Denies nausea, heartburn or change in bowel habits Skin: Denies abnormal skin rashes Lymphatics: Denies new lymphadenopathy or easy bruising Neurological:Denies numbness, tingling or new weaknesses MSK: (+) back and bilateral hip pain, right>left, ambulates with walker Behavioral/Psych: Mood is stable, no new changes  All other systems were reviewed with the patient and are negative.  PHYSICAL  EXAMINATION:  ECOG PERFORMANCE STATUS: 2-3  Vitals:   12/04/17 1523  BP: (!) 143/73  Pulse: 77  Resp: 18  Temp: 97.9 F (36.6 C)  SpO2: 99%   Filed Weights   12/04/17 1523  Weight: 185 lb 6.4 oz (84.1 kg)    GENERAL:alert, no distress and comfortable SKIN: skin color, texture, turgor are normal, no rashes or significant lesions EYES: normal, conjunctiva are pink and non-injected, sclera clear OROPHARYNX:no exudate, no erythema and lips, buccal mucosa, and tongue normal  NECK: supple, thyroid normal size, non-tender, without nodularity LYMPH:  no palpable lymphadenopathy in the cervical, axillary or inguinal LUNGS: clear to auscultation and percussion with normal breathing effort HEART: regular rate & rhythm and no murmurs and no lower extremity edema ABDOMEN:abdomen soft, non-tender and normal bowel sounds Musculoskeletal:no cyanosis of digits and no clubbing  PSYCH: alert & oriented x 3 with fluent speech NEURO: no focal motor/sensory deficits  LABORATORY DATA:  I have reviewed the data as listed CBC Latest Ref Rng & Units 09/10/2017 09/11/2016 02/20/2016  WBC 4.0 - 10.3 K/uL 8.6 7.9 7.6  Hemoglobin 13.0 - 17.1 g/dL 13.4 13.3 13.0  Hematocrit 38.4 - 49.9 % 39.7 39.3 37.4(L)  Platelets 140 - 400 K/uL 188 165 150   Recent Labs     09/10/17 1234  NA 139  K 4.1  CL 107  CO2 23  GLUCOSE 155*  BUN 18  CREATININE 1.49*  CALCIUM 9.3  GFRNONAA 44*  GFRAA 50*  PROT 7.8  ALBUMIN 3.6  AST 20  ALT 20  ALKPHOS 66  BILITOT 0.5   MGUS LAB: M-protein  12/05/2014: 0.6 05/04/15: 0.6 08/03/2015: 0.6 09/11/16: 0.5 09/10/17: 0.9 11/25/2017: 0.67  IgG 12/05/14 1880 08/03/2015: 1625 3/15/2-18: 1679 09/10/17: PENDING  11/25/2017: 2030   KAPPA, LAMBDA light chains and rat 12/05/2014 and are : 2.76, 1.56, 1.77 05/04/2015: 4.14, 1.69, 2.45 08/03/2015: 2.54, 1.25, 2.04 09/11/2016: 3.96, 1.66, 2.39 09/10/17: 4.73, 1.57, 0.301 11/25/2017: 5.18, 1.80, 2.88  24 hour UPEP/IFE 12/08/2014 Comments: No monoclonal free light chains (Bence Jones Protein) are detected.Urine IFE shows polyclonal increase in free Kappa and/or free Lambdalight chains.  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 - SEE COMMENT. Diagnosis Note The bone marrow is normocellular, however, there is a mild increase in plasma cells (9% by aspirate, 5-10% by CD138). Light chain immunohistochemistry reveals an apparent kappa excess. 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 Correlation with clinical, laboratory, and radiographic data is required for further classification.  RADIOGRAPHIC STUDIES: I have personally reviewed the radiological images as listed and agreed with the findings in the report.  Dg Bone Survey Met  12/12/2014   CLINICAL DATA:  Monoclonal gammopathy of uncertain significance. Question multiple myeloma.  EXAM: METASTATIC BONE SURVEY  COMPARISON:  None.  FINDINGS: A single lucent lesion on the lateral view of the skull measuring 0.7 cm is identified. 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 No other lytic lesion is seen. The patient has severe degenerative disease about the hips. A well-circumscribed  lucent lesion in the femoral neck on the right is likely a large synovial herniation pit. The patient is status post CABG. The lungs are clear. Scattered mild appearing spondylosis is seen about the spine. Bilateral iliac stents are in place.  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 No convincing evidence of  multiple myeloma is identified.  Severe degenerative disease about the hips.   Electronically Signed   By: Inge Rise M.D.   On: 12/12/2014 13:57   CT abdomen, pelvis and chest with contrast Nov 15, 2017 at [facility] Impression: 1.  Redemonstration of lucencies the bilateral iliac bones as seen on recent CT. Another small lesion is seen in the L1 vertebral body. As described, myeloma and metastases are considerations. MRI could be considered for further evaluation, as well as laboratory correlation. 2.  No other evidence of soft tissue neoplasm in the chest, abdomen, and pelvis.  ASSESSMENT & PLAN:  77 y.o.  male with past medical history of diabetes and chronic kidney disease  1. 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 He is asymptomatic except chronic left hip pain from arthritis. -I previously reviewed his bone marrow biopsy results, which showed 5-10% of plasma cell. -I previously  reviewed his lab results. SPEP showed monoclonal IgG gammopathy, with low M protein level (0.5-0.6g/dl), serum free Kappa light chain is slightly elevated, light chain ratio was normal. -His initial lab showed normal CBC, CMP showed a normal calcium level, creatinine 1.44. He does have long-standing history of HTN and diabetes, which can cause chronic kidney disease. -A bone survey was negative for lytic lesions.  -Based on the above test results, he has IgG MGUS. No evidence of multiple myeloma. -I previously reviewed the natural history of MGUS, and the risk of  progressing to multiple myeloma (1% per year). He scan protein has been remained the same since his diagnosis, his risk is probably low. I recommend surveillance with labs and office visit every 6 months. No treatment is needed. -due to his stable and low level M-protein, I have been seeing him once a year  -he had a colonoscopy by Dr. Collene Mares on 07/23/17. 6 small polyps and small internal hemorrhoids were found. The polyps were removed and biopsies. Pathology results showed no evidence of malignancy.  -CT CAP w contrast on 11/25/2017 shows concern for: Redemonstration of lucencies the bilateral iliac bones as seen on recent CT. Another small lesion is seen in the L1 vertebral body. As described, myeloma and metastases are considerations. MRI could be considered for further evaluation, as well as laboratory correlation. No other evidence of soft tissue neoplasm in the chest, abdomen, and pelvis.  We discussed that his bone lesions in the pelvis is indeterminate, the possibilities are multiple myeloma, metastatic cancer (very unlikely given the negative CT for primary tumor) and benign changes  -Recent labs and imaging studies showed stable M-protein and IgG levels.  Mild elevated creatinine is stable, no anemia or hypercalcemia. I  Discussed with patient, his wife, and his daughter today.  -I discussed that due to his recent imaging studies, multiple myeloma is a possibility.  I recommend a bone marrow biopsy in the next few months to rule out. -Patient will proceed with the vascular procedure on next week at [facility] prior to a total hip replacement at [facility].  -I discussed with the patient and his family that I will speak with his surgeon, Dr. Okey Dupre and pathologist at [facility] to determine if they can obtain bone marrow from the femoral head following his surgery to evaluate for multiple myeloma.  -If there aren't enough cells within the bone marrow of the head of the right hip to rule out multiple myeloma on the  pathology for [facility], then I recommended that the  patient follow up once healed from his hip surgery for a bone marrow biopsy.  -I will communicate with the patients surgeon and his medical oncologist at [facility].  -F/u open, I will see him back 1 month after his surgery.    2. HTN, DM, CAD, CKD -He will continue follow-up with his primary care physician endocrinologist and nephrologist. 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 He will continue to follow his cardiologist and look for possible medical management.   3. Bilateral hip arthritis and back pain  -He will continue follow-up with his orthopedic surgeon, he has not decided if he wants total hip replacement. -Given his heart condition he is not cleared for hip replacement. He ambulates with a walker and have gained weight given his inactivity.  -he has seen orthopedic surgeon Dr. Okey Dupre at [facility], and plan to have right total hip replacement in the near future.    Plan We reviewed his recent lab and CT scan results from [place] I will discuss with Dr. Okey Dupre to see if he surgical sample can be examed by path to rule out MM. If not, will get a bone marrow biopsy after he recovers from his hip surgery F/u open, will follow up 1 mont after his surgery   All questions were answered. The patient knows to call the clinic with any problems, questions or concerns.  I spent 20 minutes counseling the patient face to face. The total time spent in the appointment was 25 minutes and more than 50% was on counseling.   Truitt Merle, MD 12/04/2017   I, Soijett Blue am acting as scribe for Dr. Truitt Merle.  I have reviewed the above documentation for accuracy and completeness, and I agree with the above.

## 2017-12-05 ENCOUNTER — Telehealth: Payer: Self-pay | Admitting: Hematology

## 2017-12-05 DIAGNOSIS — R6889 Other general symptoms and signs: Secondary | ICD-10-CM

## 2017-12-05 NOTE — H&P (Signed)
Dustin [name] 2017/11/25 1:45 PM Location: [facility] Patient #: [id number] DOB: [birth year] Married / Language: English / Race: Black or African American Male   History of Present Illness Dustin Leep MD; 11/21/2017 8:59 AM) Patient words: Last O/V 10/14/2017; F/U for ABI's & Carotid Duplex.  The patient is a 77 year old male who presents for a Follow-up for Coronary artery disease. 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 Annamaria Boots at [facility], self referred for management of CAD. He is currently scheudled to undergo Rt hip replacement by Dr. Florian [name] at [facility] on June 11, with plans for Lt hip replacement down the road.  Since his last visit with me, he underwent bilateral lower extremity ABI that showed ABI 0.74 on right, and 0.39 on the left. Carotid ultrasound showed soft plaque without significant stenosis. He also underwent CT scan of his hip is at [facility] that showed multiple lucencies that for concerning for either multiple myeloma or metastatic disease. Given his history of MGUS, multiple myeloma was felt to be more likely. He is scheduled to see hematology oncology at [facility] regarding the same.  From cardiovascular symptom standpooint, he has had some improvement in his leg swelling. He denies any chest pain. Shortness of breath on exertion is stable. He does have bilateral hip and thigh pain pain on walking, likely to be a combination of hip arthritis as well as vascular claudication.   Problem List/Past Medical Dustin [name]; 11/25/17 1:37 PM) Laboratory examination (Z01.89)  Labs 09/18/2017: Glucose 79. BUN/creatinine 16/1.37. EGFR 77. Sodium 136, potassium 4.3 HbA1C  7.3% Cholesterol 205, LDL 140, triglyceride 130, HDL 38 Benign essential hypertension (I10)  Hyperlipidemia, group A (E78.00)  Controlled type 2 diabetes mellitus without complication, with long-term current use of insulin (E11.9)  S/P CABG (coronary artery bypass graft) (Z95.1)  PAD (peripheral artery disease) (I73.9)  S/p peripheral stenting in the past. ABI 11/10/2017: This exam reveals moderately decreased perfusion of the right lower extremity, noted at the post tibial artery level (ABI 0.74 with moderately abnormal waveform-biphasic at ankle) and severely decreased perfusion of the left lower extremity, noted at the dorsalis pedis and post tibial artery level (ABI 0.39 with severely abnormal waveform-monophasic). Persistent atrial fibrillation (I48.1)  CHA2DS2VASc score 5: Annual stroke risk 7.2% EKG 10/14/2017: Atrial fibrillation, controlled ventricular rate 58 bpm. Dustin [name]. Normal conduction. Poor R wave progression. Old anteroseptal infarct. Nonspecific ST-T changes inferior leads. 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 No hemodynamically significant stenosis. Antegrade right vertebral artery flow. Antegrade left vertebral artery flow.  Allergies Dustin [name]; Nov 25, 2017 1:37 PM) No Known Drug Allergies [10/14/2017]:  Family History Dustin [name]; 2017/11/25 1:37 PM) Mother  Deceased. at age 69; BP, MI (1st one) Father  Deceased. at age 38; no heart issues Sister 3  Deceased. all passed; oldest had leaking valve ; DM complications, Lung disease Brother 2  In stable health. 1 older, 1 younger; oldest has pacemaker  Social History Dustin [name]; 11-25-2017 1:37 PM) Current  tobacco use  Never smoker. Non Drinker/No Alcohol Use  Marital status  Married. Living Situation  Lives with spouse. Number of Children  3.  Past Surgical History Dustin [name]; 11-29-2017 1:37 PM) Dustin [name] bypass [1992]:  Medication History Dustin Leep, MD; 11/21/2017 9:07 AM) BiDil (20-37.5MG Tablet, 1 (one) Tablet Oral three times daily, Taken starting 10/19/2017) Active. Rosuvastatin Calcium (20MG Tablet, 1 (one) Tablet Oral once daily, Taken starting 10/14/2017) Active. Lasix (40MG Tablet, 1 (one) Tablet Oral TWICE a day, Taken starting 10/14/2017) Active. Potassium Chloride ER (20MEQ Tablet ER, 1 (one) Tablet Oral TWICE DAILY, Taken starting 10/14/2017) Active. Metoprolol Tartrate (50MG Tablet, 1 Oral two times daily) Active. Lisinopril (40MG Tablet, 1 Oral daily) Active. Rivaroxaban (20MG Tablet, 1 Oral daily) Active. Tradjenta (5MG Tablet, 1 Oral daily) Active. Toujeo SoloStar (300UNIT/ML Soln Pen-inj, 45 Subcutaneous daily) Active. Aspirin (81MG Capsule, 1 Oral daily) Active. Timolol Maleate (0.5% Solution, Ophthalmic) Active. Travatan Z (0.004% Solution, Ophthalmic) Active. Folate-B12-Intrinsic Factor (800-500-20MCG-MCG-MG Tablet, 1 Oral daily) Active. Vitamin D3 (5000UNIT Tablet, 1 Oral daily) Active. Vitamin A (25000UNIT Capsule, 1 Oral daily) Active. Nerve Renew Vitamin (1 two times daily) Active. Doxycycline Hyclate (100MG Capsule, 1 Oral two times daily for tick bite) Active. Medications Reconciled (Pt brought list)  Diagnostic Studies History (April Garrison; 29-Nov-2017 1:50 PM) Carotid Doppler  11/10/2017: There is mild soft plaque throughout the right and left common and internal carotid arteries with increased IMT. No hemodynamically significant stenosis. Antegrade right vertebral artery flow. Antegrade left vertebral artery flow. ABI's  S/p peripheral stenting in the past. 11/10/2017: This exam reveals moderately decreased perfusion  of the right lower extremity, noted at the post tibial artery level (ABI 0.74 with moderately abnormal waveform-biphasic at ankle) and severely decreased perfusion of the left lower extremity, noted at the dorsalis pedis and post tibial artery level (ABI 0.39 with severely abnormal waveform-monophasic). CT Scan [11/13/2017]: Hip, Pelvis Impression: 1. Right greater than left severe hip osteoarthritis. 2. Multiple indeterminate lucencies within the bilateral iliac bones, greater on the left. Diagnostic considerations include multiple myeloma and metastatic disese among other etiologies, favoring the former given the reported history of MGUS. Recommend correlation with prior imaging and/or possible myeloma panel for further evaluation.    Review of Systems Dustin Leep, MD; 11/21/2017 9:8 AM) General Not Present- Appetite Loss and Weight Gain. Respiratory Not Present- Chronic Cough and Wakes up from Sleep Wheezing or Short of Breath. Cardiovascular Present- Chest Pain, Edema and Hypertension. Not Present- Claudications, Difficulty Breathing Lying Down, Difficulty Breathing On Exertion, Fainting and Palpitations. Gastrointestinal Not Present- Black, Tarry Stool and Difficulty Swallowing. Musculoskeletal Not Present- Decreased Range of Motion and Muscle Atrophy. Neurological Not Present- Attention Deficit. Psychiatric Not Present- Personality Changes and Suicidal Ideation. Endocrine Not Present- Cold Intolerance and Heat Intolerance. Hematology Not Present- Abnormal Bleeding. All other systems negative  Vitals (April Garrison; 11/29/2017 2:00 PM) 11/29/2017 1:40 PM Weight: 184 lb Height: 66in Body Surface Area: 1.93 m Body Mass Index: 29.7 kg/m  Pulse: 70 (Regular)  P.OX: 98% (Room air) BP: 126/72 (Sitting, Left Arm, Standard)       Physical Exam Joya Gaskins Patwardhan MD; 11/21/2017 8:58 AM) General Mental Status-Alert. General Appearance-Cooperative and Appears stated  age. Build & Nutrition-Moderately built.  Head and Neck Thyroid Gland Characteristics - normal size and consistency and no palpable nodules.  Chest and Lung Exam Chest and lung exam reveals -quiet, even and easy respiratory effort with no use of accessory muscles, non-tender and on auscultation, normal breath sounds, no adventitious sounds.  Cardiovascular Cardiovascular examination reveals -carotid auscultation reveals no bruits and abdominal aorta auscultation reveals no bruits and no prominent pulsation. Auscultation Rhythm - Regular. Murmurs & Other Heart Sounds: Murmur: Murmur 2 - Location - Mitral Area and Tricuspid Area. Timing - Holosystolic. Grade - II/VI. Location - Aortic Area. Grade - II/VI. Character - Crescendo.  Abdomen Palpation/Percussion Normal exam - Non Tender and No hepatosplenomegaly.  Peripheral Vascular Lower Extremity Palpation - Edema - Bilateral - 1+ Pitting edema. Femoral pulse - Bilateral - Feeble. Dorsalis pedis pulse - Bilateral - Absent. Posterior tibial pulse - Bilateral - Absent. Carotid arteries - Bilateral-No Carotid bruit. Note: Currently wearing compressions stockings Left > Right lower abdominal bruit   Neurologic Neurologic evaluation reveals -alert and oriented x 3 with no impairment of recent or remote memory. Motor-Grossly intact without any focal deficits.  Musculoskeletal Global Assessment Left Lower Extremity - no deformities, masses or tenderness, no known fractures. Right Lower Extremity - no deformities, masses or tenderness, no known fractures.    Assessment & Plan [facility] Patwardhan MD; 11/21/2017 9:08 AM) Abnormal ankle brachial index (ABI) (R68.89) Current Plans Complete duplex ultrasound of arteries of lower extremity (82423) Coronary artery disease without angina pectoris (I25.10) S/P CABG (coronary artery bypass graft) (Z95.1) Hypertension, benign (I10) Hyperlipidemia, mixed (E78.2) Bilateral leg edema  (R60.0) PAD (peripheral artery disease) (I73.9) Story: S/p peripheral stenting in the past.  ABI 11/10/2017: This exam reveals moderately decreased perfusion of the right lower extremity, noted at the post tibial artery level (ABI 0.74 with moderately abnormal waveform-biphasic at ankle) and severely decreased perfusion of the left lower extremity, noted at the dorsalis pedis and post tibial artery level (ABI 0.39 with severely abnormal waveform-monophasic). DM2 (diabetes mellitus, type 2) (E11.9)  Note:Recommendations:  77 year old African American male with hypertension, hyperlipidemia, hyperlipidemia, type 2 DM A1C 7.8%, coronary artery disease s/p CABG 1992, PCI is in 2000, 2001, peripheral artery disease status post stents in both legs in 2007, persistent Afib on Xarelto, OSA on CPAP, CKD 3, GERD, h/o MGUS followed by Dr. Annamaria Boots at [facility], self referred for management of CAD. He is currently scheduled to undergo Rt hip replacement by Dr. Florian [name] at [facility] on June 11, with plans for Lt hip replacement down the road.  Known coronary artery disease with no symptoms of angina and this time. Blood presusr emuch better controlled. I have reviewed his recent nuclear stress test as well as coronary and bypass graft angiogram performed at [facility]. His perioperative cardiac risk remains elevated, but unlikely to be mitigated by any invasive revascularization attempts. He is on appropriate medical therapy at this time. Continue the same.  I am more concerned his regarding his peripheral artery disease. Given his bilateral reduced ABI, I will obtain arterial duplex ultrasound of both lower extremities. His ABIs particularly worse on the left. With hip replacement planned, would like to rule out any severe external iliac stenosis that potentially could hamper his perioperative healing. Given his severely reduced ABI on the left of 5.36, I certainly would not recommend proceeding  with left hip replacement at this time. He may need revascularization prior to left hip replacement. Given his quality of life that is impaired severely with hip arthritis, it is reasonable to proceed with right hip replacement as long as there is no severe right external iliac stenosis. Fortunately, he does not have any resting pain or critical limb ischemia at this time. Without any planned hip replacement surgery, I do not see any emergent  need for peripheral revascularization. Continue aggressive medical therapy.  I will update the patient and his orthopedic surgeon Dr. Okey Dupre at [facility] with the results of arterial duplex ultrasound and further recommendations. Continue workup for this hematology oncology regarding suspected multiple myeloma.  I will see him in 4 wks to follow up on his tolerance to rosuvastatin, bidil, and to discuss the results of his ultrasound studies.  Cc Mohammed Kindle, MD Cc Dr. Dorris Fetch (Endocrinology, [facility]) Cc Dr. Okey Dupre (Orthopedics at [facility])  Signed electronically by Dustin Leep, MD (11/21/2017 9:09 AM)  Addendum: I had a conversation with patient, his wife, patient's orthopedic surgeon Dr. Okey Dupre. Patient continues to have bilateral hip and thigh pain, which is likely a combination of hip osteoarthritis as well as claudication. Given severe abnormal ABI bilaterally, with duplex ultrasound suggesting possible bilateral iliac stenosis, Dr. Okey Dupre and I both felt that this could increase the perioperative and post operative risk due to decreased perfusion. We both agreed that we should proceed with bilateral lower extremity angiography and possible intervention. Dr. Okey Dupre will consider surgery 3 months later after 3 months of DAPT with aspirin and plavix. Patient's oncologist Dr. Dicie Beam also aware of the plan. He will continue ongoing workup and management for possible multiple myeloma.   Cc Gar Ponto, MD

## 2017-12-05 NOTE — Telephone Encounter (Signed)
No LOS 6/7

## 2017-12-05 NOTE — H&P (View-Only) (Signed)
Dustin [name] 11/18/2017 1:45 PM Location: [facility] Patient #: [id number] DOB: 04/11/1941 Married / Language: English / Race: Black or African American Male   History of Present Illness (Humna Moorehouse MD; 11/21/2017 8:59 AM) Patient words: Last O/V 10/14/2017; F/U for ABI's & Carotid Duplex.  The patient is a 76 year old male who presents for a Follow-up for Coronary artery disease. 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 He is currently scheudled to undergo Rt hip replacement by Dr. Samuel Wellman at [facility] on June 11, with plans for Lt hip replacement down the road.  Since his last visit with me, he underwent bilateral lower extremity ABI that showed ABI 0.74 on right, and 0.39 on the left. Carotid ultrasound showed soft plaque without significant stenosis. He also underwent CT scan of his hip is at [facility] that showed multiple lucencies that for concerning for either multiple myeloma or metastatic disease. Given his history of MGUS, multiple myeloma was felt to be more likely. He is scheduled to see hematology oncology at [facility] regarding the same.  From cardiovascular symptom standpooint, he has had some improvement in his leg swelling. He denies any chest pain. Shortness of breath on exertion is stable. He does have bilateral hip and thigh pain pain on walking, likely to be a combination of hip arthritis as well as vascular claudication.   Problem List/Past Medical (Shayna Johnson; 11/18/2017 1:37 PM) Laboratory examination (Z01.89)  Labs 09/18/2017: Glucose 79. BUN/creatinine 16/1.37. EGFR 77. Sodium 136, potassium 4.3 HbA1C  7.3% Cholesterol 205, LDL 140, triglyceride 130, HDL 38 Benign essential hypertension (I10)  Hyperlipidemia, group A (E78.00)  Controlled type 2 diabetes mellitus without complication, with long-term current use of insulin (E11.9)  S/P CABG (coronary artery bypass graft) (Z95.1)  PAD (peripheral artery disease) (I73.9)  S/p peripheral stenting in the past. ABI 11/10/2017: This exam reveals moderately decreased perfusion of the right lower extremity, noted at the post tibial artery level (ABI 0.74 with moderately abnormal waveform-biphasic at ankle) and severely decreased perfusion of the left lower extremity, noted at the dorsalis pedis and post tibial artery level (ABI 0.39 with severely abnormal waveform-monophasic). Persistent atrial fibrillation (I48.1)  CHA2DS2VASc score 5: Annual stroke risk 7.2% EKG 10/14/2017: Atrial fibrillation, controlled ventricular rate 58 bpm. Noemal axis. Normal conduction. Poor R wave progression. Old anteroseptal infarct. Nonspecific ST-T changes inferior leads. 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 No hemodynamically significant stenosis. Antegrade right vertebral artery flow. Antegrade left vertebral artery flow.  Allergies (Shayna Johnson; 11/18/2017 1:37 PM) No Known Drug Allergies [10/14/2017]:  Family History (Shayna Johnson; 11/18/2017 1:37 PM) Mother  Deceased. at age 69; BP, MI (1st one) Father  Deceased. at age 87; no heart issues Sister 3  Deceased. all passed; oldest had leaking valve ; DM complications, Lung disease Brother 2  In stable health. 1 older, 1 younger; oldest has pacemaker  Social History (Shayna Johnson; 11/18/2017 1:37 PM) Current    tobacco use  Never smoker. Non Drinker/No Alcohol Use  Marital status  Married. Living Situation  Lives with spouse. Number of Children  3.  Past Surgical History (Shayna Johnson; 11/18/2017 1:37 PM) Quad bypass [1992]:  Medication History (Shama Monfils, MD; 11/21/2017 9:07 AM) BiDil (20-37.5MG Tablet, 1 (one) Tablet Oral three times daily, Taken starting 10/19/2017) Active. Rosuvastatin Calcium (20MG Tablet, 1 (one) Tablet Oral once daily, Taken starting 10/14/2017) Active. Lasix (40MG Tablet, 1 (one) Tablet Oral TWICE a day, Taken starting 10/14/2017) Active. Potassium Chloride ER (20MEQ Tablet ER, 1 (one) Tablet Oral TWICE DAILY, Taken starting 10/14/2017) Active. Metoprolol Tartrate (50MG Tablet, 1 Oral two times daily) Active. Lisinopril (40MG Tablet, 1 Oral daily) Active. Rivaroxaban (20MG Tablet, 1 Oral daily) Active. Tradjenta (5MG Tablet, 1 Oral daily) Active. Toujeo SoloStar (300UNIT/ML Soln Pen-inj, 45 Subcutaneous daily) Active. Aspirin (81MG Capsule, 1 Oral daily) Active. Timolol Maleate (0.5% Solution, Ophthalmic) Active. Travatan Z (0.004% Solution, Ophthalmic) Active. Folate-B12-Intrinsic Factor (800-500-20MCG-MCG-MG Tablet, 1 Oral daily) Active. Vitamin D3 (5000UNIT Tablet, 1 Oral daily) Active. Vitamin A (25000UNIT Capsule, 1 Oral daily) Active. Nerve Renew Vitamin (1 two times daily) Active. Doxycycline Hyclate (100MG Capsule, 1 Oral two times daily for tick bite) Active. Medications Reconciled (Pt brought list)  Diagnostic Studies History (April Garrison; 11/18/2017 1:50 PM) Carotid Doppler  11/10/2017: There is mild soft plaque throughout the right and left common and internal carotid arteries with increased IMT. No hemodynamically significant stenosis. Antegrade right vertebral artery flow. Antegrade left vertebral artery flow. ABI's  S/p peripheral stenting in the past. 11/10/2017: This exam reveals moderately decreased perfusion  of the right lower extremity, noted at the post tibial artery level (ABI 0.74 with moderately abnormal waveform-biphasic at ankle) and severely decreased perfusion of the left lower extremity, noted at the dorsalis pedis and post tibial artery level (ABI 0.39 with severely abnormal waveform-monophasic). CT Scan [11/13/2017]: Hip, Pelvis Impression: 1. Right greater than left severe hip osteoarthritis. 2. Multiple indeterminate lucencies within the bilateral iliac bones, greater on the left. Diagnostic considerations include multiple myeloma and metastatic disese among other etiologies, favoring the former given the reported history of MGUS. Recommend correlation with prior imaging and/or possible myeloma panel for further evaluation.    Review of Systems (Tonnie Stillman, MD; 11/21/2017 9:8 AM) General Not Present- Appetite Loss and Weight Gain. Respiratory Not Present- Chronic Cough and Wakes up from Sleep Wheezing or Short of Breath. Cardiovascular Present- Chest Pain, Edema and Hypertension. Not Present- Claudications, Difficulty Breathing Lying Down, Difficulty Breathing On Exertion, Fainting and Palpitations. Gastrointestinal Not Present- Black, Tarry Stool and Difficulty Swallowing. Musculoskeletal Not Present- Decreased Range of Motion and Muscle Atrophy. Neurological Not Present- Attention Deficit. Psychiatric Not Present- Personality Changes and Suicidal Ideation. Endocrine Not Present- Cold Intolerance and Heat Intolerance. Hematology Not Present- Abnormal Bleeding. All other systems negative  Vitals (April Garrison; 11/18/2017 2:00 PM) 11/18/2017 1:40 PM Weight: 184 lb Height: 66in Body Surface Area: 1.93 m Body Mass Index: 29.7 kg/m  Pulse: 70 (Regular)  P.OX: 98% (Room air) BP: 126/72 (Sitting, Left Arm, Standard)       Physical Exam (Jadalyn Oliveri MD; 11/21/2017 8:58 AM) General Mental Status-Alert. General Appearance-Cooperative and Appears stated  age. Build & Nutrition-Moderately built.  Head and Neck Thyroid Gland Characteristics - normal size and consistency and no palpable nodules.  Chest and Lung Exam Chest and lung exam reveals -quiet, even and easy respiratory effort with no use of accessory muscles, non-tender and on auscultation, normal breath sounds, no adventitious sounds.    Cardiovascular Cardiovascular examination reveals -carotid auscultation reveals no bruits and abdominal aorta auscultation reveals no bruits and no prominent pulsation. Auscultation Rhythm - Regular. Murmurs & Other Heart Sounds: Murmur: Murmur 2 - Location - Mitral Area and Tricuspid Area. Timing - Holosystolic. Grade - II/VI. Location - Aortic Area. Grade - II/VI. Character - Crescendo.  Abdomen Palpation/Percussion Normal exam - Non Tender and No hepatosplenomegaly.  Peripheral Vascular Lower Extremity Palpation - Edema - Bilateral - 1+ Pitting edema. Femoral pulse - Bilateral - Feeble. Dorsalis pedis pulse - Bilateral - Absent. Posterior tibial pulse - Bilateral - Absent. Carotid arteries - Bilateral-No Carotid bruit. Note: Currently wearing compressions stockings Left > Right lower abdominal bruit   Neurologic Neurologic evaluation reveals -alert and oriented x 3 with no impairment of recent or remote memory. Motor-Grossly intact without any focal deficits.  Musculoskeletal Global Assessment Left Lower Extremity - no deformities, masses or tenderness, no known fractures. Right Lower Extremity - no deformities, masses or tenderness, no known fractures.    Assessment & Plan (Ilana Prezioso MD; 11/21/2017 9:08 AM) Abnormal ankle brachial index (ABI) (R68.89) Current Plans Complete duplex ultrasound of arteries of lower extremity (93925) Coronary artery disease without angina pectoris (I25.10) S/P CABG (coronary artery bypass graft) (Z95.1) Hypertension, benign (I10) Hyperlipidemia, mixed (E78.2) Bilateral leg edema  (R60.0) PAD (peripheral artery disease) (I73.9) Story: S/p peripheral stenting in the past.  ABI 11/10/2017: This exam reveals moderately decreased perfusion of the right lower extremity, noted at the post tibial artery level (ABI 0.74 with moderately abnormal waveform-biphasic at ankle) and severely decreased perfusion of the left lower extremity, noted at the dorsalis pedis and post tibial artery level (ABI 0.39 with severely abnormal waveform-monophasic). 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 He is currently scheduled to undergo Rt hip replacement by Dr. Samuel Wellman at [facility] on June 11, with plans for Lt hip replacement down the road.  Known coronary artery disease with no symptoms of angina and this time. Blood presusr emuch better controlled. I have reviewed his recent nuclear stress test as well as coronary and bypass graft angiogram performed at [facility]. His perioperative cardiac risk remains elevated, but unlikely to be mitigated by any invasive revascularization attempts. He is on appropriate medical therapy at this time. Continue the same.  I am more concerned his regarding his peripheral artery disease. Given his bilateral reduced ABI, I will obtain arterial duplex ultrasound of both lower extremities. His ABIs particularly worse on the left. With hip replacement planned, would like to rule out any severe external iliac stenosis that potentially could hamper his perioperative healing. Given his severely reduced ABI on the left of 0.39, I certainly would not recommend proceeding  with left hip replacement at this time. He may need revascularization prior to left hip replacement. Given his quality of life that is impaired severely with hip arthritis, it is reasonable to proceed with right hip replacement as long as there is no severe right external iliac stenosis. Fortunately, he does not have any resting pain or critical limb ischemia at this time. Without any planned hip replacement surgery, I do not see any emergent   need for peripheral revascularization. Continue aggressive medical therapy.  I will update the patient and his orthopedic surgeon Dr. Wellman at [facility] with the results of arterial duplex ultrasound and further recommendations. Continue workup for this hematology oncology regarding suspected multiple myeloma.  I will see him in 4 wks to follow up on his tolerance to rosuvastatin, bidil, and to discuss the results of his ultrasound studies.  Cc Twana Jaff, MD Cc Dr. Nida (Endocrinology, [facility]) Cc Dr. Wellman (Orthopedics at [facility])  Signed electronically by Lansing Sigmon, MD (11/21/2017 9:09 AM)  Addendum: I had a conversation with patient, his wife, patient's orthopedic surgeon Dr. Wellman. Patient continues to have bilateral hip and thigh pain, which is likely a combination of hip osteoarthritis as well as claudication. Given severe abnormal ABI bilaterally, with duplex ultrasound suggesting possible bilateral iliac stenosis, Dr. Wellman and I both felt that this could increase the perioperative and post operative risk due to decreased perfusion. We both agreed that we should proceed with bilateral lower extremity angiography and possible intervention. Dr. Wellman will consider surgery 3 months later after 3 months of DAPT with aspirin and plavix. Patient's oncologist Dr. Penninger also aware of the plan. He will continue ongoing workup and management for possible multiple myeloma.   Cc Terry Daniel, MD   

## 2017-12-07 ENCOUNTER — Ambulatory Visit (HOSPITAL_COMMUNITY)
Admission: RE | Admit: 2017-12-07 | Discharge: 2017-12-09 | Disposition: A | Discharge status: Home / Self Care | Payer: Medicare Other | Source: Ambulatory Visit | Attending: Cardiology | Admitting: Cardiology

## 2017-12-07 ENCOUNTER — Other Ambulatory Visit: Payer: Self-pay

## 2017-12-07 ENCOUNTER — Encounter (HOSPITAL_COMMUNITY): Payer: Self-pay

## 2017-12-07 DIAGNOSIS — Z9889 Other specified postprocedural states: Secondary | ICD-10-CM | POA: Insufficient documentation

## 2017-12-07 DIAGNOSIS — I481 Persistent atrial fibrillation: Secondary | ICD-10-CM | POA: Insufficient documentation

## 2017-12-07 DIAGNOSIS — Z79899 Other long term (current) drug therapy: Secondary | ICD-10-CM | POA: Insufficient documentation

## 2017-12-07 DIAGNOSIS — I129 Hypertensive chronic kidney disease with stage 1 through stage 4 chronic kidney disease, or unspecified chronic kidney disease: Secondary | ICD-10-CM | POA: Diagnosis not present

## 2017-12-07 DIAGNOSIS — I70213 Atherosclerosis of native arteries of extremities with intermittent claudication, bilateral legs: Secondary | ICD-10-CM | POA: Diagnosis not present

## 2017-12-07 DIAGNOSIS — E1151 Type 2 diabetes mellitus with diabetic peripheral angiopathy without gangrene: Secondary | ICD-10-CM | POA: Insufficient documentation

## 2017-12-07 DIAGNOSIS — K219 Gastro-esophageal reflux disease without esophagitis: Secondary | ICD-10-CM | POA: Insufficient documentation

## 2017-12-07 DIAGNOSIS — Z7982 Long term (current) use of aspirin: Secondary | ICD-10-CM | POA: Insufficient documentation

## 2017-12-07 DIAGNOSIS — G4733 Obstructive sleep apnea (adult) (pediatric): Secondary | ICD-10-CM | POA: Insufficient documentation

## 2017-12-07 DIAGNOSIS — D472 Monoclonal gammopathy: Secondary | ICD-10-CM | POA: Diagnosis not present

## 2017-12-07 DIAGNOSIS — Z951 Presence of aortocoronary bypass graft: Secondary | ICD-10-CM | POA: Diagnosis not present

## 2017-12-07 DIAGNOSIS — Z7901 Long term (current) use of anticoagulants: Secondary | ICD-10-CM | POA: Insufficient documentation

## 2017-12-07 DIAGNOSIS — Z8249 Family history of ischemic heart disease and other diseases of the circulatory system: Secondary | ICD-10-CM | POA: Insufficient documentation

## 2017-12-07 DIAGNOSIS — M169 Osteoarthritis of hip, unspecified: Secondary | ICD-10-CM | POA: Diagnosis not present

## 2017-12-07 DIAGNOSIS — E1122 Type 2 diabetes mellitus with diabetic chronic kidney disease: Secondary | ICD-10-CM | POA: Diagnosis not present

## 2017-12-07 DIAGNOSIS — Z833 Family history of diabetes mellitus: Secondary | ICD-10-CM | POA: Diagnosis not present

## 2017-12-07 DIAGNOSIS — E785 Hyperlipidemia, unspecified: Secondary | ICD-10-CM | POA: Insufficient documentation

## 2017-12-07 DIAGNOSIS — N183 Chronic kidney disease, stage 3 (moderate): Secondary | ICD-10-CM | POA: Insufficient documentation

## 2017-12-07 DIAGNOSIS — I739 Peripheral vascular disease, unspecified: Secondary | ICD-10-CM | POA: Diagnosis present

## 2017-12-07 LAB — BASIC METABOLIC PANEL
ANION GAP: 8 (ref 5–15)
BUN: 24 mg/dL — ABNORMAL HIGH (ref 6–20)
CALCIUM: 9 mg/dL (ref 8.9–10.3)
CO2: 22 mmol/L (ref 22–32)
Chloride: 108 mmol/L (ref 101–111)
Creatinine, Ser: 1.69 mg/dL — ABNORMAL HIGH (ref 0.61–1.24)
GFR calc Af Amer: 43 mL/min — ABNORMAL LOW (ref >60)
GFR, EST NON AFRICAN AMERICAN: 37 mL/min — AB (ref >60)
Glucose, Bld: 171 mg/dL — ABNORMAL HIGH (ref 65–99)
Potassium: 4.7 mmol/L (ref 3.5–5.1)
Sodium: 138 mmol/L (ref 135–145)

## 2017-12-07 LAB — GLUCOSE, CAPILLARY
GLUCOSE-CAPILLARY: 143 mg/dL — AB (ref 65–99)
Glucose-Capillary: 167 mg/dL — ABNORMAL HIGH (ref 65–99)

## 2017-12-07 MED ORDER — ISOSORB DINITRATE-HYDRALAZINE 20-37.5 MG PO TABS
1.0000 | ORAL_TABLET | Freq: Three times a day (TID) | ORAL | Status: DC
  Administered 2017-12-07 – 2017-12-09 (×4): 1 via ORAL
  Filled 2017-12-07 (×4): qty 1

## 2017-12-07 MED ORDER — ASPIRIN EC 81 MG PO TBEC
81.0000 mg | DELAYED_RELEASE_TABLET | Freq: Every day | ORAL | Status: DC
  Administered 2017-12-08 – 2017-12-09 (×2): 81 mg via ORAL
  Filled 2017-12-07 (×2): qty 1

## 2017-12-07 MED ORDER — SODIUM CHLORIDE 0.9 % IV SOLN: INTRAVENOUS | Status: DC

## 2017-12-07 MED ORDER — ROSUVASTATIN CALCIUM 20 MG PO TABS
20.0000 mg | ORAL_TABLET | Freq: Every day | ORAL | Status: DC
  Administered 2017-12-07 – 2017-12-08 (×2): 20 mg via ORAL
  Filled 2017-12-07 (×2): qty 1

## 2017-12-07 MED ORDER — METOPROLOL TARTRATE 50 MG PO TABS
50.0000 mg | ORAL_TABLET | Freq: Two times a day (BID) | ORAL | Status: DC
  Administered 2017-12-07 – 2017-12-09 (×3): 50 mg via ORAL
  Filled 2017-12-07 (×3): qty 1

## 2017-12-07 MED ORDER — INSULIN GLARGINE 100 UNIT/ML ~~LOC~~ SOLN
20.0000 [IU] | Freq: Every day | SUBCUTANEOUS | Status: DC
  Administered 2017-12-07 – 2017-12-08 (×2): 20 [IU] via SUBCUTANEOUS
  Filled 2017-12-07 (×2): qty 0.2

## 2017-12-07 MED ORDER — SODIUM CHLORIDE 0.9 % IV SOLN
INTRAVENOUS | Status: DC
  Administered 2017-12-07: 18:00:00 via INTRAVENOUS

## 2017-12-07 MED ORDER — INSULIN ASPART 100 UNIT/ML ~~LOC~~ SOLN
0.0000 [IU] | Freq: Three times a day (TID) | SUBCUTANEOUS | Status: DC
  Administered 2017-12-08: 5 [IU] via SUBCUTANEOUS

## 2017-12-07 NOTE — Progress Notes (Signed)
MD called, voicemail left stating patient needs medication orders placed. Awaiting response.

## 2017-12-07 NOTE — Progress Notes (Signed)
I spoke with Dr. Virgina Jock over the phone and advised him or patients admission. Awaiting orders.

## 2017-12-07 NOTE — Plan of Care (Signed)
  Problem: Education: Goal: Knowledge of General Education information will improve Outcome: Progressing   Problem: Clinical Measurements: Goal: Ability to maintain clinical measurements within normal limits will improve Outcome: Progressing Goal: Will remain free from infection Outcome: Progressing Goal: Diagnostic test results will improve Outcome: Progressing

## 2017-12-08 ENCOUNTER — Encounter (HOSPITAL_COMMUNITY)
Admission: RE | Disposition: A | Discharge status: Home / Self Care | Payer: Self-pay | Source: Ambulatory Visit | Attending: Cardiology

## 2017-12-08 ENCOUNTER — Encounter (HOSPITAL_COMMUNITY): Payer: Self-pay | Admitting: Cardiology

## 2017-12-08 ENCOUNTER — Ambulatory Visit (HOSPITAL_COMMUNITY): Admission: RE | Admit: 2017-12-08 | Payer: Medicare Other | Source: Ambulatory Visit | Admitting: Cardiology

## 2017-12-08 DIAGNOSIS — I70213 Atherosclerosis of native arteries of extremities with intermittent claudication, bilateral legs: Secondary | ICD-10-CM | POA: Diagnosis not present

## 2017-12-08 DIAGNOSIS — I739 Peripheral vascular disease, unspecified: Secondary | ICD-10-CM | POA: Diagnosis present

## 2017-12-08 HISTORY — PX: ABDOMINAL AORTOGRAM: CATH118222

## 2017-12-08 HISTORY — PX: PERIPHERAL VASCULAR INTERVENTION: CATH118257

## 2017-12-08 HISTORY — PX: PERIPHERAL VASCULAR BALLOON ANGIOPLASTY: CATH118281

## 2017-12-08 HISTORY — PX: LOWER EXTREMITY ANGIOGRAPHY: CATH118251

## 2017-12-08 LAB — BASIC METABOLIC PANEL
ANION GAP: 4 — AB (ref 5–15)
BUN: 20 mg/dL (ref 6–20)
CALCIUM: 8.7 mg/dL — AB (ref 8.9–10.3)
CHLORIDE: 109 mmol/L (ref 101–111)
CO2: 23 mmol/L (ref 22–32)
Creatinine, Ser: 1.56 mg/dL — ABNORMAL HIGH (ref 0.61–1.24)
GFR calc Af Amer: 48 mL/min — ABNORMAL LOW (ref >60)
GFR calc non Af Amer: 41 mL/min — ABNORMAL LOW (ref >60)
GLUCOSE: 156 mg/dL — AB (ref 65–99)
Potassium: 4.3 mmol/L (ref 3.5–5.1)
Sodium: 136 mmol/L (ref 135–145)

## 2017-12-08 LAB — CBC
HEMATOCRIT: 35.9 % — AB (ref 39.0–52.0)
HEMOGLOBIN: 11.7 g/dL — AB (ref 13.0–17.0)
MCH: 30.3 pg (ref 26.0–34.0)
MCHC: 32.6 g/dL (ref 30.0–36.0)
MCV: 93 fL (ref 78.0–100.0)
Platelets: 152 10*3/uL (ref 150–400)
RBC: 3.86 MIL/uL — ABNORMAL LOW (ref 4.22–5.81)
RDW: 13.9 % (ref 11.5–15.5)
WBC: 8.5 10*3/uL (ref 4.0–10.5)

## 2017-12-08 LAB — POCT ACTIVATED CLOTTING TIME
ACTIVATED CLOTTING TIME: 158 s
Activated Clotting Time: 186 seconds

## 2017-12-08 LAB — GLUCOSE, CAPILLARY
GLUCOSE-CAPILLARY: 114 mg/dL — AB (ref 65–99)
GLUCOSE-CAPILLARY: 244 mg/dL — AB (ref 65–99)
Glucose-Capillary: 130 mg/dL — ABNORMAL HIGH (ref 65–99)
Glucose-Capillary: 125 mg/dL — ABNORMAL HIGH (ref 65–99)
Glucose-Capillary: 155 mg/dL — ABNORMAL HIGH (ref 65–99)

## 2017-12-08 SURGERY — LOWER EXTREMITY ANGIOGRAPHY
Anesthesia: LOCAL

## 2017-12-08 MED ORDER — SODIUM CHLORIDE 0.9% FLUSH
3.0000 mL | Freq: Two times a day (BID) | INTRAVENOUS | Status: DC
  Administered 2017-12-08 – 2017-12-09 (×2): 3 mL via INTRAVENOUS

## 2017-12-08 MED ORDER — MIDAZOLAM HCL 2 MG/2ML IJ SOLN
INTRAMUSCULAR | Status: AC
  Filled 2017-12-08: qty 2

## 2017-12-08 MED ORDER — FENTANYL CITRATE (PF) 100 MCG/2ML IJ SOLN
INTRAMUSCULAR | Status: AC
  Filled 2017-12-08: qty 2

## 2017-12-08 MED ORDER — CLOPIDOGREL BISULFATE 300 MG PO TABS
ORAL_TABLET | ORAL | Status: AC
  Filled 2017-12-08: qty 1

## 2017-12-08 MED ORDER — HYDRALAZINE HCL 20 MG/ML IJ SOLN: 5.0000 mg | INTRAMUSCULAR | Status: AC | PRN

## 2017-12-08 MED ORDER — IODIXANOL 320 MG/ML IV SOLN
INTRAVENOUS | Status: DC | PRN
  Administered 2017-12-08: 35 mL via INTRA_ARTERIAL

## 2017-12-08 MED ORDER — SODIUM CHLORIDE 0.9 % IV SOLN
INTRAVENOUS | Status: AC
  Administered 2017-12-08: 11:00:00 via INTRAVENOUS

## 2017-12-08 MED ORDER — ONDANSETRON HCL 4 MG/2ML IJ SOLN: 4.0000 mg | Freq: Four times a day (QID) | INTRAMUSCULAR | Status: DC | PRN

## 2017-12-08 MED ORDER — LIDOCAINE HCL (PF) 1 % IJ SOLN
INTRAMUSCULAR | Status: DC | PRN
  Administered 2017-12-08: 10 mL

## 2017-12-08 MED ORDER — LABETALOL HCL 5 MG/ML IV SOLN
10.0000 mg | INTRAVENOUS | Status: AC | PRN
  Administered 2017-12-08: 10 mg via INTRAVENOUS
  Filled 2017-12-08: qty 4

## 2017-12-08 MED ORDER — SODIUM CHLORIDE 0.9% FLUSH: 3.0000 mL | INTRAVENOUS | Status: DC | PRN

## 2017-12-08 MED ORDER — SODIUM CHLORIDE 0.9 % IV SOLN: 250.0000 mL | INTRAVENOUS | Status: DC | PRN

## 2017-12-08 MED ORDER — HEPARIN SODIUM (PORCINE) 1000 UNIT/ML IJ SOLN
INTRAMUSCULAR | Status: AC
  Filled 2017-12-08: qty 1

## 2017-12-08 MED ORDER — MIDAZOLAM HCL 2 MG/2ML IJ SOLN
INTRAMUSCULAR | Status: DC | PRN
  Administered 2017-12-08: 1 mg via INTRAVENOUS

## 2017-12-08 MED ORDER — LIDOCAINE HCL (PF) 1 % IJ SOLN
INTRAMUSCULAR | Status: AC
  Filled 2017-12-08: qty 30

## 2017-12-08 MED ORDER — ACETAMINOPHEN 325 MG PO TABS: 650.0000 mg | ORAL_TABLET | ORAL | Status: DC | PRN

## 2017-12-08 MED ORDER — CLOPIDOGREL BISULFATE 75 MG PO TABS
75.0000 mg | ORAL_TABLET | Freq: Every day | ORAL | Status: DC
  Administered 2017-12-09: 75 mg via ORAL
  Filled 2017-12-08: qty 1

## 2017-12-08 MED ORDER — HEPARIN SODIUM (PORCINE) 1000 UNIT/ML IJ SOLN
INTRAMUSCULAR | Status: DC | PRN
  Administered 2017-12-08: 4000 [IU] via INTRAVENOUS

## 2017-12-08 MED ORDER — FENTANYL CITRATE (PF) 100 MCG/2ML IJ SOLN
INTRAMUSCULAR | Status: DC | PRN
  Administered 2017-12-08: 50 ug via INTRAVENOUS
  Administered 2017-12-08: 25 ug via INTRAVENOUS
  Administered 2017-12-08: 50 ug via INTRAVENOUS

## 2017-12-08 MED ORDER — HEPARIN (PORCINE) IN NACL 1000-0.9 UT/500ML-% IV SOLN
INTRAVENOUS | Status: AC
  Filled 2017-12-08: qty 1000

## 2017-12-08 MED ORDER — CLOPIDOGREL BISULFATE 300 MG PO TABS
300.0000 mg | ORAL_TABLET | Freq: Once | ORAL | Status: AC
  Administered 2017-12-08: 300 mg via ORAL

## 2017-12-08 MED ORDER — HEPARIN (PORCINE) IN NACL 2-0.9 UNITS/ML
INTRAMUSCULAR | Status: AC | PRN
  Administered 2017-12-08: 1000 mL via INTRA_ARTERIAL

## 2017-12-08 SURGICAL SUPPLY — 23 items
BALLN MUSTANG 6.0X20 75 (BALLOONS) ×3
BALLOON MUSTANG 6.0X20 75 (BALLOONS) ×2 IMPLANT
CATH OMNI FLUSH 5F 65CM (CATHETERS) ×3 IMPLANT
FILTER CO2 0.2 MICRON (VASCULAR PRODUCTS) ×3 IMPLANT
GLIDEWIRE ADV .035X260CM (WIRE) ×3 IMPLANT
GLIDEWIRE NITREX 0.018X80X5 (WIRE) ×3
GUIDEWIRE NITREX 0.018X80X5 (WIRE) ×6 IMPLANT
KIT MICROPUNCTURE NIT STIFF (SHEATH) ×3 IMPLANT
KIT PV (KITS) ×3 IMPLANT
RESERVOIR CO2 (VASCULAR PRODUCTS) ×3 IMPLANT
SET FLUSH CO2 (MISCELLANEOUS) ×3 IMPLANT
SHEATH AVANTI 11CM 5FR (SHEATH) ×3 IMPLANT
SHEATH BRITE TIP 8FR 35CM (SHEATH) ×3 IMPLANT
SHEATH FLEXOR ANSEL 1 7F 45CM (SHEATH) ×3 IMPLANT
SHEATH PINNACLE ST 7F 45CM (SHEATH) ×3 IMPLANT
STENT VIABAHNBX 10X39X135 (Permanent Stent) ×3 IMPLANT
STOPCOCK MORSE 400PSI 3WAY (MISCELLANEOUS) ×3 IMPLANT
SYRINGE MEDRAD AVANTA MACH 7 (SYRINGE) ×3 IMPLANT
TRANSDUCER W/STOPCOCK (MISCELLANEOUS) ×3 IMPLANT
TRAY PV CATH (CUSTOM PROCEDURE TRAY) ×3 IMPLANT
TUBING CIL FLEX 10 FLL-RA (TUBING) ×3 IMPLANT
WIRE HITORQ VERSACORE ST 145CM (WIRE) ×3 IMPLANT
WIRE TORQFLEX AUST .018X40CM (WIRE) ×6 IMPLANT

## 2017-12-08 NOTE — Progress Notes (Signed)
Site area: Rt 19fr long sheath removed Site Prior to Removal:  Level 0 Pressure Applied For:66min Manual: yes  Patient Status During Pull: A/O  Post Pull Site:  Level 0 Post Pull Instructions Given:  Post instructions given and pt understands Post Pull Pulses Present: Doppler Rt dp Dressing Applied:  Tegaderm and a 4x4 Bedrest begins @ 11:00:00 Comments: Pt leaves cath lab holding in stable condition. Rt groin is unremarkable, CDI.

## 2017-12-08 NOTE — Progress Notes (Signed)
Received order however CR services n/a for PVD stents. Will not follow. Yves Dill CES, ACSM 11:32 AM 12/08/2017

## 2017-12-08 NOTE — Interval H&P Note (Signed)
History and Physical Interval Note:  12/08/2017 7:29 AM  Dustin [name]  has presented today for surgery, with the diagnosis of abnomral abi  The various methods of treatment have been discussed with the patient and family. After consideration of risks, benefits and other options for treatment, the patient has consented to  Procedure(s): LOWER EXTREMITY ANGIOGRAPHY (N/A) as a surgical intervention .  The patient's history has been reviewed, patient examined, no change in status, stable for surgery.  I have reviewed the patient's chart and labs.  Questions were answered to the patient's satisfaction.     [place]

## 2017-12-09 DIAGNOSIS — N183 Chronic kidney disease, stage 3 (moderate): Secondary | ICD-10-CM | POA: Diagnosis not present

## 2017-12-09 DIAGNOSIS — I70213 Atherosclerosis of native arteries of extremities with intermittent claudication, bilateral legs: Secondary | ICD-10-CM | POA: Diagnosis not present

## 2017-12-09 DIAGNOSIS — E1122 Type 2 diabetes mellitus with diabetic chronic kidney disease: Secondary | ICD-10-CM | POA: Diagnosis not present

## 2017-12-09 DIAGNOSIS — I129 Hypertensive chronic kidney disease with stage 1 through stage 4 chronic kidney disease, or unspecified chronic kidney disease: Secondary | ICD-10-CM | POA: Diagnosis not present

## 2017-12-09 LAB — BASIC METABOLIC PANEL
ANION GAP: 4 — AB (ref 5–15)
BUN: 18 mg/dL (ref 6–20)
CALCIUM: 8.5 mg/dL — AB (ref 8.9–10.3)
CO2: 24 mmol/L (ref 22–32)
CREATININE: 1.45 mg/dL — AB (ref 0.61–1.24)
Chloride: 110 mmol/L (ref 101–111)
GFR calc Af Amer: 52 mL/min — ABNORMAL LOW (ref >60)
GFR, EST NON AFRICAN AMERICAN: 45 mL/min — AB (ref >60)
GLUCOSE: 158 mg/dL — AB (ref 65–99)
Potassium: 4.4 mmol/L (ref 3.5–5.1)
Sodium: 138 mmol/L (ref 135–145)

## 2017-12-09 LAB — CBC
HCT: 34.6 % — ABNORMAL LOW (ref 39.0–52.0)
Hemoglobin: 11.4 g/dL — ABNORMAL LOW (ref 13.0–17.0)
MCH: 30.3 pg (ref 26.0–34.0)
MCHC: 32.9 g/dL (ref 30.0–36.0)
MCV: 92 fL (ref 78.0–100.0)
PLATELETS: 145 10*3/uL — AB (ref 150–400)
RBC: 3.76 MIL/uL — ABNORMAL LOW (ref 4.22–5.81)
RDW: 13.8 % (ref 11.5–15.5)
WBC: 9.3 10*3/uL (ref 4.0–10.5)

## 2017-12-09 LAB — GLUCOSE, CAPILLARY: GLUCOSE-CAPILLARY: 97 mg/dL (ref 65–99)

## 2017-12-09 MED ORDER — CLOPIDOGREL BISULFATE 75 MG PO TABS: 75.0000 mg | ORAL_TABLET | Freq: Every day | ORAL | 2 refills | Status: DC

## 2017-12-09 MED FILL — Heparin Sod (Porcine)-NaCl IV Soln 1000 Unit/500ML-0.9%: INTRAVENOUS | Qty: 1000 | Status: AC

## 2017-12-09 NOTE — Discharge Summary (Signed)
Physician Discharge Summary  Patient ID: Dustin [name] MRN: [id number] DOB/AGE: 78-Nov-1942 78 y.o.  Admit date: 12/07/2017 Discharge date: 12/09/2017  Admission Diagnoses: Severe PAD CAD s/p CABG Hypertension Type 2 DM  Discharge Diagnoses:  Severe PAD CAD s/p CABG Hypertension Type 2 DM Discharged Condition: good  Hospital Course:   77 year old African American male with hypertension, hyperlipidemia, hyperlipidemia, type 2 DM A1C 7.8%, coronary artery disease s/p CABG 1992, PCI is in 2000, 2001, peripheral artery disease status post bilateral iliac stenting in 2007, persistent Afib on Xarelto, OSA on CPAP, CKD 3, GERD, h/o MGUS followed by Dr. Annamaria [name] at [facility], self referred for management of CAD. He is currently scheduled to undergo Rt hip replacement by Dr. Florian Buff at [facility] on June 11, with plans for Lt hip replacement down the road.  Given severe inflow disease seen on approximately duplex ultrasound, he was recommended to undergo peripheral angiography with possible angioplasty and intervention prior to his hip surgery.  Peripheral angiogram showed severe distal aorta 80% stenosis, 80% restenosis in the right common iliac artery stent, and 90% stenosis in left distal external iliac artery.  Patient underwent successful carotid stent placement and distal aorta and balloon angioplasty for restenosis of right common iliac artery.  Patient did undergo staged procedure with PTA to left distal external iliac artery through left brachial access next week.  Patient has improved circulation and right lower leg with 2+ popliteal, DP and PT.  Feeble pulses felt in left foot.  I recommended the patient to continue Xarelto 20 mg daily, along with Plavix 75 mg daily for 3 months.  Will arrange staged procedure next week followed by a X Mittie duplex ultrasound.  Consults: None  Significant Diagnostic Studies:  Results for Dustin, [name] (MRN [id number]) as of 12/09/2017  10:04  Ref. Range 12/08/2017 05:21 12/09/2017 03:59  WBC Latest Ref Range: 4.0 - 10.5 K/uL 8.5 9.3  RBC Latest Ref Range: 4.22 - 5.81 MIL/uL 3.86 (L) 3.76 (L)  Hemoglobin Latest Ref Range: 13.0 - 17.0 g/dL 11.7 (L) 11.4 (L)  HCT Latest Ref Range: 39.0 - 52.0 % 35.9 (L) 34.6 (L)  MCV Latest Ref Range: 78.0 - 100.0 fL 93.0 92.0  MCH Latest Ref Range: 26.0 - 34.0 pg 30.3 30.3  MCHC Latest Ref Range: 30.0 - 36.0 g/dL 32.6 32.9  RDW Latest Ref Range: 11.5 - 15.5 % 13.9 13.8  Platelets Latest Ref Range: 150 - 400 K/uL 152 145 (L)   Results for Dustin, [name] (MRN [id number]) as of 12/09/2017 10:04  Ref. Range 12/08/2017 05:21 12/09/2017 12:87  BASIC METABOLIC PANEL Unknown Rpt (A) Rpt (A)  Sodium Latest Ref Range: 135 - 145 mmol/L 136 138  Potassium Latest Ref Range: 3.5 - 5.1 mmol/L 4.3 4.4  Chloride Latest Ref Range: 101 - 111 mmol/L 109 110  CO2 Latest Ref Range: 22 - 32 mmol/L 23 24  Glucose Latest Ref Range: 65 - 99 mg/dL 156 (H) 158 (H)  BUN Latest Ref Range: 6 - 20 mg/dL 20 18  Creatinine Latest Ref Range: 0.61 - 1.24 mg/dL 1.56 (H) 1.45 (H)  Calcium Latest Ref Range: 8.9 - 10.3 mg/dL 8.7 (L) 8.5 (L)  Anion gap Latest Ref Range: 5 - 15  4 (L) 4 (L)  GFR, Est Non African American Latest Ref Range: >60 mL/min 41 (L) 45 (L)  GFR, Est African American Latest Ref Range: >60 mL/min 48 (L) 52 (L)    Treatments:  Conclusion  Abdominal aorta: 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 Lt renal artery with moderate diffuse disease (limited visualization with CO2) Rt CIA severe 80% restenosis. Mild restenosis Lt CIA  Lt EIA with distal 95% stenosis Bilateral SFA: Prox occluded with collaterals seen  Successful PTA Rt CIA and covered stent placement Infrarenal abdominal aorta (Viabahn 10.0 X 39 mm covered stent)  Left external iliac artery will be intervened in a staged manner through left brachial access.      Discharge  Exam: Blood pressure (!) 148/72, pulse 73, temperature 98.2 F (36.8 C), temperature source Oral, resp. rate 18, height 5\' 7"  (1.702 m), weight 82.3 kg (181 lb 8 oz), SpO2 97 %. Physical Exam  Constitutional: He is oriented to person, place, and time. He appears well-developed and well-nourished. No distress.  Neck: Neck supple. No JVD present.  Cardiovascular:  Pulses:      Femoral pulses are 2+ on the right side, and 1+ on the left side.      Popliteal pulses are 2+ on the right side, and 0 on the left side.       Dorsalis pedis pulses are 2+ on the right side, and 0 on the left side.       Posterior tibial pulses are 2+ on the right side, and 0 on the left side.  Irregularly irregular rhythm II/VI ejection systolic murmur aortic area   Pulmonary/Chest: Effort normal.  Abdominal: Soft. Bowel sounds are normal.  Musculoskeletal: He exhibits no edema.  Lymphadenopathy:    He has no cervical adenopathy.  Neurological: He is alert and oriented to person, place, and time.  Skin: Skin is warm and dry.  Nursing note and vitals reviewed.    Disposition: Discharge disposition: 01-Home or Self Care       Discharge Instructions    Diet - low sodium heart healthy   Complete by:  As directed    Increase activity slowly   Complete by:  As directed      Allergies as of 12/09/2017   No Known Allergies     Medication List    STOP taking these medications   aspirin EC 81 MG tablet     TAKE these medications   B-12 5000 MCG Caps Take 1 capsule by mouth 2 (two) times a week.   clopidogrel 75 MG tablet Commonly known as:  PLAVIX Take 1 tablet (75 mg total) by mouth daily with breakfast. Start taking on:  12/10/2017   furosemide 40 MG tablet Commonly known as:  LASIX Take 40 mg by mouth 2 (two) times daily.   isosorbide-hydrALAZINE 20-37.5 MG tablet Commonly known as:  BIDIL Take 1 tablet by mouth 3 (three) times daily. Notes to patient:  8 hours apart   linagliptin 5 MG  Tabs tablet Commonly known as:  TRADJENTA Take 5 mg by mouth every evening.   lisinopril 40 MG tablet Commonly known as:  PRINIVIL,ZESTRIL Take 40 mg by mouth daily.   Magnesium 250 MG Tabs Take 250 mg by mouth 2 (two) times daily.   metoprolol tartrate 50 MG tablet Commonly known as:  LOPRESSOR Take 50 mg by mouth 2 (two) times daily.   NITROSTAT 0.4 MG SL tablet Generic drug:  nitroGLYCERIN Place 0.4 mg under the tongue every 5 (five) minutes as needed for chest pain. Reported on 08/03/2015   NON FORMULARY Take 1 tablet by mouth daily. Folate   OVER THE COUNTER MEDICATION Take 1 tablet by mouth  2 (two) times daily. Nerve Renew Supplement   potassium chloride SA 20 MEQ tablet Commonly known as:  K-DUR,KLOR-CON Take 20 mEq by mouth 2 (two) times daily.   rivaroxaban 20 MG Tabs tablet Commonly known as:  XARELTO Take 20 mg by mouth daily.   rosuvastatin 20 MG tablet Commonly known as:  CRESTOR Take 20 mg by mouth at bedtime.   timolol 0.5 % ophthalmic solution Commonly known as:  TIMOPTIC Place 1 drop into both eyes daily.   TOUJEO SOLOSTAR 300 UNIT/ML Sopn Generic drug:  Insulin Glargine Inject 45 Units into the skin at bedtime.   Travoprost (BAK Free) 0.004 % Soln ophthalmic solution Commonly known as:  TRAVATAN Place 1 drop into both eyes at bedtime.   vitamin A 25000 UNIT capsule Take 25,000 Units by mouth daily.   Vitamin D3 5000 units Caps Take 10,000 Units by mouth daily.        SignedNigel Mormon 12/09/2017, 10:00 AM  [place], MD [facility] Pager: [phone] Office: [phone] If no answer Cell [phone]

## 2017-12-09 NOTE — Progress Notes (Signed)
Pt is on his home unit CPAP tolerating it well.

## 2017-12-13 NOTE — H&P (Addendum)
Dustin [name] 12-09-2017 1:45 PM Location: [facility] Patient #: [id number] DOB: Apr 18, 1941 Married / Language: English / Race: Black or African American Male   History of Present Illness Dustin Leep MD; 11/21/2017 8:59 AM) Patient words: Last O/V 10/14/2017; F/U for ABI's & Carotid Duplex.  The patient is a 77 year old male who presents for a Follow-up for Coronary artery disease. 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 Annamaria Boots at [facility], self referred for management of CAD. He is currently scheudled to undergo Rt hip replacement by Dr. Florian Buff at [facility] on June 11, with plans for Lt hip replacement down the road.  Since his last visit with me, he underwent bilateral lower extremity ABI that showed ABI 0.74 on right, and 0.39 on the left. Carotid ultrasound showed soft plaque without significant stenosis. He also underwent CT scan of his hip is at [facility] that showed multiple lucencies that for concerning for either multiple myeloma or metastatic disease. Given his history of MGUS, multiple myeloma was felt to be more likely. He is scheduled to see hematology oncology at [facility] regarding the same.  From cardiovascular symptom standpooint, he has had some improvement in his leg swelling. He denies any chest pain. Shortness of breath on exertion is stable. He does have bilateral hip and thigh pain pain on walking, likely to be a combination of hip arthritis as well as vascular claudication.   Problem List/Past Medical [name]; Dec 09, 2017 1:37 PM) Laboratory examination (Z01.89)  Labs 09/18/2017: Glucose 79. BUN/creatinine 16/1.37. EGFR 77. Sodium 136, potassium 4.3 HbA1C  7.3% Cholesterol 205, LDL 140, triglyceride 130, HDL 38 Benign essential hypertension (I10)  Hyperlipidemia, group A (E78.00)  Controlled type 2 diabetes mellitus without complication, with long-term current use of insulin (E11.9)  S/P CABG (coronary artery bypass graft) (Z95.1)  PAD (peripheral artery disease) (I73.9)  S/p peripheral stenting in the past. ABI 11/10/2017: This exam reveals moderately decreased perfusion of the right lower extremity, noted at the post tibial artery level (ABI 0.74 with moderately abnormal waveform-biphasic at ankle) and severely decreased perfusion of the left lower extremity, noted at the dorsalis pedis and post tibial artery level (ABI 0.39 with severely abnormal waveform-monophasic). Persistent atrial fibrillation (I48.1)  CHA2DS2VASc score 5: Annual stroke risk 7.2% EKG 10/14/2017: Atrial fibrillation, controlled ventricular rate 58 bpm. Noemal axis. Normal conduction. Poor R wave progression. Old anteroseptal infarct. Nonspecific ST-T changes inferior leads. 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 No hemodynamically significant stenosis. Antegrade right vertebral artery flow. Antegrade left vertebral artery flow.  Allergies [name]; 12/09/2017 1:37 PM) No Known Drug Allergies [10/14/2017]:  Family History Dustin [name]; December 09, 2017 1:37 PM) Mother  Deceased. at age 57; BP, MI (1st one) Father  Deceased. at age 88; no heart issues Sister 3  Deceased. all passed; oldest had leaking valve ; DM complications, Lung disease Brother 2  In stable health. 1 older, 1 younger; oldest has pacemaker  Social History Dustin [name]; 12-09-2017 1:37 PM) Current  tobacco use  Never smoker. Non Drinker/No Alcohol Use  Marital status  Married. Living Situation  Lives with spouse. Number of Children  3.  Past Surgical History Dustin [name]; 2017/12/01 1:37 PM) Dustin [name] bypass [1992]:  Medication History Dustin Leep, MD; 11/21/2017 9:07 AM) BiDil (20-37.5MG Tablet, 1 (one) Tablet Oral three times daily, Taken starting 10/19/2017) Active. Rosuvastatin Calcium (20MG Tablet, 1 (one) Tablet Oral once daily, Taken starting 10/14/2017) Active. Lasix (40MG Tablet, 1 (one) Tablet Oral TWICE a day, Taken starting 10/14/2017) Active. Potassium Chloride ER (20MEQ Tablet ER, 1 (one) Tablet Oral TWICE DAILY, Taken starting 10/14/2017) Active. Metoprolol Tartrate (50MG Tablet, 1 Oral two times daily) Active. Lisinopril (40MG Tablet, 1 Oral daily) Active. Rivaroxaban (20MG Tablet, 1 Oral daily) Active. Tradjenta (5MG Tablet, 1 Oral daily) Active. Toujeo SoloStar (300UNIT/ML Soln Pen-inj, 45 Subcutaneous daily) Active. Aspirin (81MG Capsule, 1 Oral daily) Active. Timolol Maleate (0.5% Solution, Ophthalmic) Active. Travatan Z (0.004% Solution, Ophthalmic) Active. Folate-B12-Intrinsic Factor (800-500-20MCG-MCG-MG Tablet, 1 Oral daily) Active. Vitamin D3 (5000UNIT Tablet, 1 Oral daily) Active. Vitamin A (25000UNIT Capsule, 1 Oral daily) Active. Nerve Renew Vitamin (1 two times daily) Active. Doxycycline Hyclate (100MG Capsule, 1 Oral two times daily for tick bite) Active. Medications Reconciled (Pt brought list)  Diagnostic Studies History (April Garrison; 12-01-17 1:50 PM) Carotid Doppler  11/10/2017: There is mild soft plaque throughout the right and left common and internal carotid arteries with increased IMT. No hemodynamically significant stenosis. Antegrade right vertebral artery flow. Antegrade left vertebral artery flow. ABI's  S/p peripheral stenting in the past. 11/10/2017: This exam reveals moderately decreased  perfusion of the right lower extremity, noted at the post tibial artery level (ABI 0.74 with moderately abnormal waveform-biphasic at ankle) and severely decreased perfusion of the left lower extremity, noted at the dorsalis pedis and post tibial artery level (ABI 0.39 with severely abnormal waveform-monophasic). CT Scan [11/13/2017]: Hip, Pelvis Impression: 1. Right greater than left severe hip osteoarthritis. 2. Multiple indeterminate lucencies within the bilateral iliac bones, greater on the left. Diagnostic considerations include multiple myeloma and metastatic disese among other etiologies, favoring the former given the reported history of MGUS. Recommend correlation with prior imaging and/or possible myeloma panel for further evaluation.    Review of Systems Dustin Leep, MD; 11/21/2017 9:8 AM) General Not Present- Appetite Loss and Weight Gain. Respiratory Not Present- Chronic Cough and Wakes up from Sleep Wheezing or Short of Breath. Cardiovascular Present- Chest Pain, Edema and Hypertension. Not Present- Claudications, Difficulty Breathing Lying Down, Difficulty Breathing On Exertion, Fainting and Palpitations. Gastrointestinal Not Present- Black, Tarry Stool and Difficulty Swallowing. Musculoskeletal Not Present- Decreased Range of Motion and Muscle Atrophy. Neurological Not Present- Attention Deficit. Psychiatric Not Present- Personality Changes and Suicidal Ideation. Endocrine Not Present- Cold Intolerance and Heat Intolerance. Hematology Not Present- Abnormal Bleeding. All other systems negative  Vitals (April Garrison; 12-01-2017 2:00 PM) Dec 01, 2017 1:40 PM Weight: 184 lb Height: 66in Body Surface Area: 1.93 m Body Mass Index: 29.7 kg/m  Pulse: 70 (Regular)  P.OX: 98% (Room air) BP: 126/72 (Sitting, Left Arm, Standard)       Physical Exam Joya Gaskins Charice Zuno MD; 11/21/2017 8:58 AM) General Mental Status-Alert. General Appearance-Cooperative  and Appears stated age. Build & Nutrition-Moderately built.  Head and Neck Thyroid Gland Characteristics - normal size and consistency and no palpable nodules.  Chest and Lung Exam Chest and lung exam reveals -quiet, even and easy respiratory effort with no use of accessory muscles, non-tender and on auscultation, normal breath sounds, no adventitious sounds.  Cardiovascular Cardiovascular examination reveals -carotid auscultation reveals no bruits and abdominal aorta auscultation reveals no bruits and no prominent pulsation. Auscultation Rhythm - Regular. Murmurs & Other Heart Sounds: Murmur: Murmur 2 - Location - Mitral Area and Tricuspid Area. Timing - Holosystolic. Grade - II/VI. Location - Aortic Area. Grade - II/VI. Character - Crescendo.  Abdomen Palpation/Percussion Normal exam - Non Tender and No hepatosplenomegaly.  Peripheral Vascular Lower Extremity Palpation - Edema - Bilateral - 1+ Pitting edema. Femoral pulse - Bilateral - Feeble. Dorsalis pedis pulse - Bilateral - Absent. Posterior tibial pulse - Bilateral - Absent. Carotid arteries - Bilateral-No Carotid bruit. Note: Currently wearing compressions stockings Left > Right lower abdominal bruit   Neurologic Neurologic evaluation reveals -alert and oriented x 3 with no impairment of recent or remote memory. Motor-Grossly intact without any focal deficits.  Musculoskeletal Global Assessment Left Lower Extremity - no deformities, masses or tenderness, no known fractures. Right Lower Extremity - no deformities, masses or tenderness, no known fractures.    Assessment & Plan [facility] Ludwig Tugwell MD; 11/21/2017 9:08 AM) Abnormal ankle brachial index (ABI) (R68.89) Current Plans Complete duplex ultrasound of arteries of lower extremity (01027) Coronary artery disease without angina pectoris (I25.10) S/P CABG (coronary artery bypass graft) (Z95.1) Hypertension, benign (I10) Hyperlipidemia, mixed  (E78.2) Bilateral leg edema (R60.0) PAD (peripheral artery disease) (I73.9) Story: S/p peripheral stenting in the past.  ABI 11/10/2017: This exam reveals moderately decreased perfusion of the right lower extremity, noted at the post tibial artery level (ABI 0.74 with moderately abnormal waveform-biphasic at ankle) and severely decreased perfusion of the left lower extremity, noted at the dorsalis pedis and post tibial artery level (ABI 0.39 with severely abnormal waveform-monophasic). DM2 (diabetes mellitus, type 2) (E11.9)  Note:Recommendations:  77 year old African American male with hypertension, hyperlipidemia, hyperlipidemia, type 2 DM A1C 7.8%, coronary artery disease s/p CABG 1992, PCI is in 2000, 2001, peripheral artery disease status post stents in both legs in 2007, persistent Afib on Xarelto, OSA on CPAP, CKD 3, GERD, h/o MGUS followed by Dr. Annamaria Boots at [place] [facility], self referred for management of CAD. He is currently scheduled to undergo Rt hip replacement by Dr. Florian Buff at [facility] on June 11, with plans for Lt hip replacement down the road.  Known coronary artery disease with no symptoms of angina and this time. Blood presusr emuch better controlled. I have reviewed his recent nuclear stress test as well as coronary and bypass graft angiogram performed at [facility]. His perioperative cardiac risk remains elevated, but unlikely to be mitigated by any invasive revascularization attempts. He is on appropriate medical therapy at this time. Continue the same.  I am more concerned his regarding his peripheral artery disease. Given his bilateral reduced ABI, I will obtain arterial duplex ultrasound of both lower extremities. His ABIs particularly worse on the left. With hip replacement planned, would like to rule out any severe external iliac stenosis that potentially could hamper his perioperative healing. Given his severely reduced ABI on the left of 2.53, I certainly  would not recommend proceeding with left hip replacement at this time. He may need revascularization prior to left hip replacement. Given his quality of life that is impaired severely with hip arthritis, it is reasonable to proceed with right hip replacement as long as there is no severe right external iliac stenosis. Fortunately, he does not have any resting pain or critical limb ischemia at this time. Without any planned hip replacement surgery, I do not see any emergent  need for peripheral revascularization. Continue aggressive medical therapy.  I will update the patient and his orthopedic surgeon Dr. Okey Dupre at [facility] with the results of arterial duplex ultrasound and further recommendations. Continue workup for this hematology oncology regarding suspected multiple myeloma.  I will see him in 4 wks to follow up on his tolerance to rosuvastatin, bidil, and to discuss the results of his ultrasound studies.  Cc Mohammed Kindle, MD Cc Dr. Dorris Fetch (Endocrinology, [facility]) Cc Dr. Okey Dupre (Orthopedics at [facility])  Signed electronically by Dustin Leep, MD (11/21/2017 9:09 AM)  Addendum: After detailed discussion with the patient, [facility] orthopedic surgeon Dr. Okey Dupre, he underwent perioheral angiography to evaluate severe inflow disease prior to hip replacement surgery.  Patient udnerewnt successful intervention with covererd stent placement tio infrarenal abdominal aorta, and PTA to Rt CIA for severe restenosis. He also has Lt distal external iliac artery sever stenosis. Patient is here today for staged procedure through left brachial access to improve perioperative circulation prior to bilateral hip surgery. Plan is to continue plavix and xarelto for 4 weeks after PTA, then stop plavix and proceed with hip replacement.  Addednum physical exam 12/09/2017: Blood pressure (!) 148/72, pulse 73, temperature 98.2 F (36.8 C), temperature source Oral, resp. rate 18, height 5' 7"  (1.702 m), weight 82.3  kg (181 lb 8 oz), SpO2 97 %. Physical Exam  Constitutional: He is oriented to person, place, and time. He appears well-developed and well-nourished. No distress.  Neck: Neck supple. No JVD present.  Cardiovascular:  Pulses:      Femoral pulses are 2+ on the right side, and 1+ on the left side.      Popliteal pulses are 2+ on the right side, and 0 on the left side.       Dorsalis pedis pulses are 2+ on the right side, and 0 on the left side.       Posterior tibial pulses are 2+ on the right side, and 0 on the left side.  Irregularly irregular rhythm II/VI ejection systolic murmur aortic area   Pulmonary/Chest: Effort normal.  Abdominal: Soft. Bowel sounds are normal.  Musculoskeletal: He exhibits no edema.  Lymphadenopathy:    He has no cervical adenopathy.  Neurological: He is alert and oriented to person, place, and time.  Skin: Skin is warm and dry.  Nursing note and vitals reviewed.   Nigel Mormon, MD [facility] Pager: [phone] Office: [phone] If no answer Cell [phone]

## 2017-12-15 ENCOUNTER — Encounter (HOSPITAL_COMMUNITY): Payer: Self-pay | Admitting: Cardiology

## 2017-12-15 ENCOUNTER — Other Ambulatory Visit: Payer: Self-pay

## 2017-12-15 ENCOUNTER — Ambulatory Visit (HOSPITAL_COMMUNITY)
Admission: RE | Admit: 2017-12-15 | Discharge: 2017-12-16 | Disposition: A | Discharge status: Home / Self Care | Payer: Medicare Other | Source: Ambulatory Visit | Attending: Cardiology | Admitting: Cardiology

## 2017-12-15 ENCOUNTER — Encounter (HOSPITAL_COMMUNITY)
Admission: RE | Disposition: A | Discharge status: Home / Self Care | Payer: Self-pay | Source: Ambulatory Visit | Attending: Cardiology

## 2017-12-15 DIAGNOSIS — I70212 Atherosclerosis of native arteries of extremities with intermittent claudication, left leg: Secondary | ICD-10-CM | POA: Diagnosis not present

## 2017-12-15 DIAGNOSIS — E1151 Type 2 diabetes mellitus with diabetic peripheral angiopathy without gangrene: Secondary | ICD-10-CM | POA: Insufficient documentation

## 2017-12-15 DIAGNOSIS — K219 Gastro-esophageal reflux disease without esophagitis: Secondary | ICD-10-CM | POA: Insufficient documentation

## 2017-12-15 DIAGNOSIS — I2510 Atherosclerotic heart disease of native coronary artery without angina pectoris: Secondary | ICD-10-CM | POA: Diagnosis not present

## 2017-12-15 DIAGNOSIS — E1122 Type 2 diabetes mellitus with diabetic chronic kidney disease: Secondary | ICD-10-CM | POA: Insufficient documentation

## 2017-12-15 DIAGNOSIS — I97638 Postprocedural hematoma of a circulatory system organ or structure following other circulatory system procedure: Secondary | ICD-10-CM | POA: Diagnosis not present

## 2017-12-15 DIAGNOSIS — N183 Chronic kidney disease, stage 3 (moderate): Secondary | ICD-10-CM | POA: Diagnosis not present

## 2017-12-15 DIAGNOSIS — Z7982 Long term (current) use of aspirin: Secondary | ICD-10-CM | POA: Diagnosis not present

## 2017-12-15 DIAGNOSIS — I739 Peripheral vascular disease, unspecified: Secondary | ICD-10-CM | POA: Diagnosis present

## 2017-12-15 DIAGNOSIS — Z7901 Long term (current) use of anticoagulants: Secondary | ICD-10-CM | POA: Diagnosis not present

## 2017-12-15 DIAGNOSIS — Y838 Other surgical procedures as the cause of abnormal reaction of the patient, or of later complication, without mention of misadventure at the time of the procedure: Secondary | ICD-10-CM | POA: Diagnosis not present

## 2017-12-15 DIAGNOSIS — M160 Bilateral primary osteoarthritis of hip: Secondary | ICD-10-CM | POA: Insufficient documentation

## 2017-12-15 DIAGNOSIS — G4733 Obstructive sleep apnea (adult) (pediatric): Secondary | ICD-10-CM | POA: Diagnosis not present

## 2017-12-15 DIAGNOSIS — I481 Persistent atrial fibrillation: Secondary | ICD-10-CM | POA: Insufficient documentation

## 2017-12-15 DIAGNOSIS — Z951 Presence of aortocoronary bypass graft: Secondary | ICD-10-CM | POA: Diagnosis not present

## 2017-12-15 DIAGNOSIS — I129 Hypertensive chronic kidney disease with stage 1 through stage 4 chronic kidney disease, or unspecified chronic kidney disease: Secondary | ICD-10-CM | POA: Insufficient documentation

## 2017-12-15 DIAGNOSIS — E782 Mixed hyperlipidemia: Secondary | ICD-10-CM | POA: Diagnosis not present

## 2017-12-15 DIAGNOSIS — D472 Monoclonal gammopathy: Secondary | ICD-10-CM | POA: Insufficient documentation

## 2017-12-15 HISTORY — DX: Pure hypercholesterolemia, unspecified: E78.00

## 2017-12-15 HISTORY — DX: Type 2 diabetes mellitus without complications: E11.9

## 2017-12-15 HISTORY — PX: LOWER EXTREMITY ANGIOGRAPHY: CATH118251

## 2017-12-15 HISTORY — DX: Chronic kidney disease, stage 3 unspecified: N18.30

## 2017-12-15 HISTORY — DX: Chronic kidney disease, stage 3 (moderate): N18.3

## 2017-12-15 HISTORY — DX: Obstructive sleep apnea (adult) (pediatric): G47.33

## 2017-12-15 HISTORY — DX: Pneumonia, unspecified organism: J18.9

## 2017-12-15 HISTORY — DX: Unspecified osteoarthritis, unspecified site: M19.90

## 2017-12-15 HISTORY — DX: Dependence on other enabling machines and devices: Z99.89

## 2017-12-15 LAB — GLUCOSE, CAPILLARY
GLUCOSE-CAPILLARY: 113 mg/dL — AB (ref 65–99)
GLUCOSE-CAPILLARY: 202 mg/dL — AB (ref 65–99)
Glucose-Capillary: 135 mg/dL — ABNORMAL HIGH (ref 65–99)
Glucose-Capillary: 198 mg/dL — ABNORMAL HIGH (ref 65–99)

## 2017-12-15 LAB — POCT ACTIVATED CLOTTING TIME
Activated Clotting Time: 197 seconds
Activated Clotting Time: 235 seconds

## 2017-12-15 SURGERY — LOWER EXTREMITY ANGIOGRAPHY
Anesthesia: LOCAL

## 2017-12-15 MED ORDER — LABETALOL HCL 5 MG/ML IV SOLN
10.0000 mg | INTRAVENOUS | Status: DC | PRN
  Administered 2017-12-15: 10 mg via INTRAVENOUS

## 2017-12-15 MED ORDER — ONDANSETRON HCL 4 MG/2ML IJ SOLN: 4.0000 mg | Freq: Four times a day (QID) | INTRAMUSCULAR | Status: DC | PRN

## 2017-12-15 MED ORDER — METOPROLOL TARTRATE 25 MG PO TABS
50.0000 mg | ORAL_TABLET | Freq: Two times a day (BID) | ORAL | Status: DC
  Administered 2017-12-15 – 2017-12-16 (×3): 50 mg via ORAL
  Filled 2017-12-15: qty 2
  Filled 2017-12-15: qty 1
  Filled 2017-12-15 (×2): qty 2

## 2017-12-15 MED ORDER — CLOPIDOGREL BISULFATE 300 MG PO TABS
ORAL_TABLET | ORAL | Status: DC | PRN
  Administered 2017-12-15: 300 mg via ORAL

## 2017-12-15 MED ORDER — MIDAZOLAM HCL 2 MG/2ML IJ SOLN
INTRAMUSCULAR | Status: DC | PRN
  Administered 2017-12-15: 1 mg via INTRAVENOUS

## 2017-12-15 MED ORDER — ACETAMINOPHEN 325 MG PO TABS
650.0000 mg | ORAL_TABLET | ORAL | Status: DC | PRN
  Administered 2017-12-15: 13:00:00 650 mg via ORAL
  Filled 2017-12-15: qty 2

## 2017-12-15 MED ORDER — HEPARIN (PORCINE) IN NACL 2-0.9 UNITS/ML
INTRAMUSCULAR | Status: AC | PRN
  Administered 2017-12-15: 1000 mL via INTRA_ARTERIAL

## 2017-12-15 MED ORDER — LISINOPRIL 40 MG PO TABS
40.0000 mg | ORAL_TABLET | Freq: Every day | ORAL | Status: DC
  Administered 2017-12-16: 09:00:00 40 mg via ORAL
  Filled 2017-12-15: qty 1

## 2017-12-15 MED ORDER — SODIUM CHLORIDE 0.9% FLUSH: 3.0000 mL | INTRAVENOUS | Status: DC | PRN

## 2017-12-15 MED ORDER — HEPARIN (PORCINE) IN NACL 1000-0.9 UT/500ML-% IV SOLN
INTRAVENOUS | Status: AC
  Filled 2017-12-15: qty 1000

## 2017-12-15 MED ORDER — HEPARIN SODIUM (PORCINE) 1000 UNIT/ML IJ SOLN
INTRAMUSCULAR | Status: DC | PRN
  Administered 2017-12-15: 5000 [IU] via INTRAVENOUS
  Administered 2017-12-15 (×2): 2000 [IU] via INTRAVENOUS

## 2017-12-15 MED ORDER — ASPIRIN 325 MG PO TABS
ORAL_TABLET | ORAL | Status: AC
  Filled 2017-12-15: qty 1

## 2017-12-15 MED ORDER — SODIUM CHLORIDE 0.9 % IV SOLN
INTRAVENOUS | Status: DC
  Administered 2017-12-15: 07:00:00 via INTRAVENOUS

## 2017-12-15 MED ORDER — ANGIOPLASTY BOOK
Freq: Once | Status: AC
  Administered 2017-12-16: 07:00:00
  Filled 2017-12-15: qty 1

## 2017-12-15 MED ORDER — LABETALOL HCL 5 MG/ML IV SOLN
INTRAVENOUS | Status: DC | PRN
  Administered 2017-12-15: 10 mg via INTRAVENOUS

## 2017-12-15 MED ORDER — INSULIN GLARGINE 100 UNIT/ML ~~LOC~~ SOLN
30.0000 [IU] | Freq: Every day | SUBCUTANEOUS | Status: DC
  Administered 2017-12-15: 23:00:00 30 [IU] via SUBCUTANEOUS
  Filled 2017-12-15 (×2): qty 0.3

## 2017-12-15 MED ORDER — HEPARIN SODIUM (PORCINE) 1000 UNIT/ML IJ SOLN
INTRAMUSCULAR | Status: AC
  Filled 2017-12-15: qty 1

## 2017-12-15 MED ORDER — MIDAZOLAM HCL 2 MG/2ML IJ SOLN
INTRAMUSCULAR | Status: AC
Start: 2017-12-15 — End: ?
  Filled 2017-12-15: qty 2

## 2017-12-15 MED ORDER — IODIXANOL 320 MG/ML IV SOLN
INTRAVENOUS | Status: DC | PRN
  Administered 2017-12-15: 90 mL via INTRA_ARTERIAL

## 2017-12-15 MED ORDER — HYDRALAZINE HCL 20 MG/ML IJ SOLN
5.0000 mg | INTRAMUSCULAR | Status: DC | PRN
  Filled 2017-12-15: qty 1

## 2017-12-15 MED ORDER — SODIUM CHLORIDE 0.9 % IV SOLN: INTRAVENOUS | Status: AC

## 2017-12-15 MED ORDER — LIDOCAINE HCL (PF) 1 % IJ SOLN
INTRAMUSCULAR | Status: AC
  Filled 2017-12-15: qty 30

## 2017-12-15 MED ORDER — SODIUM CHLORIDE 0.9 % IV SOLN: 250.0000 mL | INTRAVENOUS | Status: DC | PRN

## 2017-12-15 MED ORDER — ISOSORB DINITRATE-HYDRALAZINE 20-37.5 MG PO TABS
1.0000 | ORAL_TABLET | Freq: Three times a day (TID) | ORAL | Status: DC
  Administered 2017-12-15 – 2017-12-16 (×3): 1 via ORAL
  Filled 2017-12-15 (×3): qty 1

## 2017-12-15 MED ORDER — CLOPIDOGREL BISULFATE 75 MG PO TABS
75.0000 mg | ORAL_TABLET | Freq: Every day | ORAL | Status: DC
  Administered 2017-12-16: 07:00:00 75 mg via ORAL
  Filled 2017-12-15: qty 1

## 2017-12-15 MED ORDER — LABETALOL HCL 5 MG/ML IV SOLN
INTRAVENOUS | Status: AC
  Filled 2017-12-15: qty 4

## 2017-12-15 MED ORDER — NITROGLYCERIN 1 MG/10 ML FOR IR/CATH LAB
INTRA_ARTERIAL | Status: DC | PRN
  Administered 2017-12-15: 200 ug via INTRA_ARTERIAL

## 2017-12-15 MED ORDER — NITROGLYCERIN 1 MG/10 ML FOR IR/CATH LAB
INTRA_ARTERIAL | Status: AC
  Filled 2017-12-15: qty 10

## 2017-12-15 MED ORDER — FENTANYL CITRATE (PF) 100 MCG/2ML IJ SOLN
INTRAMUSCULAR | Status: AC
  Filled 2017-12-15: qty 2

## 2017-12-15 MED ORDER — FENTANYL CITRATE (PF) 100 MCG/2ML IJ SOLN
INTRAMUSCULAR | Status: DC | PRN
  Administered 2017-12-15: 50 ug via INTRAVENOUS

## 2017-12-15 MED ORDER — MORPHINE SULFATE (PF) 2 MG/ML IV SOLN
2.0000 mg | INTRAVENOUS | Status: DC | PRN
  Administered 2017-12-15 (×3): 2 mg via INTRAVENOUS
  Filled 2017-12-15 (×3): qty 1

## 2017-12-15 MED ORDER — LIDOCAINE HCL (PF) 1 % IJ SOLN
INTRAMUSCULAR | Status: DC | PRN
  Administered 2017-12-15: 20 mL

## 2017-12-15 MED ORDER — ASPIRIN 81 MG PO CHEW
CHEWABLE_TABLET | ORAL | Status: DC | PRN
  Administered 2017-12-15: 324 mg via ORAL

## 2017-12-15 MED ORDER — INSULIN ASPART 100 UNIT/ML ~~LOC~~ SOLN
0.0000 [IU] | Freq: Three times a day (TID) | SUBCUTANEOUS | Status: DC
  Administered 2017-12-15: 3 [IU] via SUBCUTANEOUS
  Administered 2017-12-16: 2 [IU] via SUBCUTANEOUS

## 2017-12-15 MED ORDER — SODIUM CHLORIDE 0.9% FLUSH
3.0000 mL | Freq: Two times a day (BID) | INTRAVENOUS | Status: DC
  Administered 2017-12-15 (×2): 3 mL via INTRAVENOUS

## 2017-12-15 MED ORDER — CLOPIDOGREL BISULFATE 300 MG PO TABS
ORAL_TABLET | ORAL | Status: AC
  Filled 2017-12-15: qty 1

## 2017-12-15 SURGICAL SUPPLY — 29 items
BALLN CHOCOLATE 6.0X40X120 (BALLOONS) ×2
BALLN IN.PACT DCB 6X120 (BALLOONS) ×2
BALLOON CHOCOLATE 6.0X40X120 (BALLOONS) ×1 IMPLANT
CATH INFINITI VERT 5FR 125CM (CATHETERS) ×2 IMPLANT
CATH NAVICROSS ST .035X135CM (MICROCATHETER) ×2 IMPLANT
COVER PRB 48X5XTLSCP FOLD TPE (BAG) ×1 IMPLANT
COVER PROBE 5X48 (BAG) ×1
DCB IN.PACT 6X120 (BALLOONS) ×1 IMPLANT
DEVICE CLOSURE MYNXGRIP 6/7F (Vascular Products) ×2 IMPLANT
GLIDEWIRE ADV .035X260CM (WIRE) ×2 IMPLANT
GLIDEWIRE NITREX 0.018X80X5 (WIRE) ×1
GUIDEWIRE ANGLED .035X260CM (WIRE) ×2 IMPLANT
GUIDEWIRE NITREX 0.018X80X5 (WIRE) ×1 IMPLANT
KIT ENCORE 26 ADVANTAGE (KITS) ×2 IMPLANT
KIT MICROPUNCTURE NIT STIFF (SHEATH) ×2 IMPLANT
KIT PV (KITS) ×2 IMPLANT
SHEATH PINNACLE 5F 10CM (SHEATH) ×2 IMPLANT
SHEATH PINNACLE 6F 10CM (SHEATH) ×2 IMPLANT
SHEATH SHUTTLE SELECT 6F (SHEATH) ×4 IMPLANT
STOPCOCK MORSE 400PSI 3WAY (MISCELLANEOUS) ×2 IMPLANT
SYRINGE MEDRAD AVANTA MACH 7 (SYRINGE) IMPLANT
TAPE VIPERTRACK RADIOPAQ (MISCELLANEOUS) ×1 IMPLANT
TAPE VIPERTRACK RADIOPAQUE (MISCELLANEOUS) ×1
TRANSDUCER W/STOPCOCK (MISCELLANEOUS) ×2 IMPLANT
TRAY PV CATH (CUSTOM PROCEDURE TRAY) ×2 IMPLANT
TUBING CIL FLEX 10 FLL-RA (TUBING) ×2 IMPLANT
WIRE HI TORQ VERSACORE J 260CM (WIRE) ×2 IMPLANT
WIRE J 3MM .035X145CM (WIRE) ×2 IMPLANT
WIRE SPARTACORE .014X300CM (WIRE) ×2 IMPLANT

## 2017-12-15 NOTE — Interval H&P Note (Signed)
History and Physical Interval Note:  12/15/2017 7:27 AM  Dustin [name]  has presented today for surgery, with the diagnosis of claudication  The various methods of treatment have been discussed with the patient and family. After consideration of risks, benefits and other options for treatment, the patient has consented to  Procedure(s): LOWER EXTREMITY ANGIOGRAPHY - Left Leg (N/A) as a surgical intervention .  The patient's history has been reviewed, patient examined, no change in status, stable for surgery.  I have reviewed the patient's chart and labs.  Questions were answered to the patient's satisfaction.    I spoke with patient's orthopedic surgeon at [facility] Dr. Okey Dupre last night. We both agree with the plan of optimal revascualrization, followed by 4 weeks of plavix and then operating on bilateral hips.  [place]

## 2017-12-15 NOTE — Progress Notes (Addendum)
Patient arrived to holding area post procedure with the left brachial artery minx closure. Hematoma present from just under the armpit to 2 inches below the insertion site.  Hematoma area marked with skin marker.  Manual pressure applied for 30 minutes, site level 2, soft, hematoma reduced. All marked area is softer and less of a hard hematoma.   Site examined post hold by Dr. Shanon Brow.   Armboard applied under left elbow.  Bedrest begins at 10:25:00

## 2017-12-15 NOTE — Progress Notes (Signed)
On arrival to holding area, Sherlyn Lick applied manual pressure to left brachial hematoma.

## 2017-12-16 DIAGNOSIS — E1151 Type 2 diabetes mellitus with diabetic peripheral angiopathy without gangrene: Secondary | ICD-10-CM | POA: Diagnosis not present

## 2017-12-16 LAB — BASIC METABOLIC PANEL
Anion gap: 5 (ref 5–15)
BUN: 21 mg/dL — ABNORMAL HIGH (ref 6–20)
CHLORIDE: 109 mmol/L (ref 101–111)
CO2: 23 mmol/L (ref 22–32)
Calcium: 8.8 mg/dL — ABNORMAL LOW (ref 8.9–10.3)
Creatinine, Ser: 1.23 mg/dL (ref 0.61–1.24)
GFR calc non Af Amer: 55 mL/min — ABNORMAL LOW (ref >60)
Glucose, Bld: 129 mg/dL — ABNORMAL HIGH (ref 65–99)
Potassium: 4.3 mmol/L (ref 3.5–5.1)
Sodium: 137 mmol/L (ref 135–145)

## 2017-12-16 LAB — GLUCOSE, CAPILLARY: Glucose-Capillary: 136 mg/dL — ABNORMAL HIGH (ref 65–99)

## 2017-12-16 MED ORDER — FUROSEMIDE 40 MG PO TABS: 40.0000 mg | ORAL_TABLET | Freq: Every day | ORAL | 3 refills | Status: DC

## 2017-12-16 MED ORDER — ASPIRIN 81 MG PO CHEW
81.0000 mg | CHEWABLE_TABLET | Freq: Once | ORAL | Status: AC
  Administered 2017-12-16: 09:00:00 81 mg via ORAL
  Filled 2017-12-16: qty 1

## 2017-12-16 MED FILL — Heparin Sod (Porcine)-NaCl IV Soln 1000 Unit/500ML-0.9%: INTRAVENOUS | Qty: 1000 | Status: AC

## 2017-12-16 NOTE — Progress Notes (Signed)
Dr Virgina Jock at bedside at this time.

## 2017-12-16 NOTE — Progress Notes (Signed)
Discharge home today. Left arm hematoma improving. No compartment syndrome.  Continue plavix, xarelto. Decrease lasix to 40 mg daily. Will arrange f/u ABI and office visit.  Nigel Mormon, MD [facility] Pager: [phone] Office: [phone] If no answer Cell [phone]

## 2017-12-21 ENCOUNTER — Other Ambulatory Visit (HOSPITAL_COMMUNITY): Payer: Self-pay | Admitting: Cardiology

## 2017-12-21 DIAGNOSIS — I739 Peripheral vascular disease, unspecified: Secondary | ICD-10-CM

## 2017-12-23 ENCOUNTER — Inpatient Hospital Stay: Payer: Medicare Other

## 2017-12-29 ENCOUNTER — Encounter: Payer: Self-pay | Admitting: Cardiology

## 2017-12-29 ENCOUNTER — Ambulatory Visit
Admission: RE | Admit: 2017-12-29 | Discharge: 2017-12-29 | Disposition: A | Discharge status: Home / Self Care | Payer: Medicare Other | Source: Ambulatory Visit | Attending: Cardiology | Admitting: Cardiology

## 2017-12-29 DIAGNOSIS — I739 Peripheral vascular disease, unspecified: Secondary | ICD-10-CM

## 2018-01-12 ENCOUNTER — Other Ambulatory Visit (HOSPITAL_COMMUNITY): Payer: Self-pay | Admitting: Cardiology

## 2018-01-12 DIAGNOSIS — R52 Pain, unspecified: Secondary | ICD-10-CM

## 2018-01-13 ENCOUNTER — Other Ambulatory Visit (HOSPITAL_COMMUNITY): Payer: Self-pay | Admitting: Cardiology

## 2018-01-13 ENCOUNTER — Ambulatory Visit (HOSPITAL_BASED_OUTPATIENT_CLINIC_OR_DEPARTMENT_OTHER)
Admission: RE | Admit: 2018-01-13 | Discharge: 2018-01-13 | Disposition: A | Discharge status: Home / Self Care | Payer: Medicare Other | Source: Ambulatory Visit | Attending: Cardiology | Admitting: Cardiology

## 2018-01-13 ENCOUNTER — Ambulatory Visit (HOSPITAL_COMMUNITY)
Admission: RE | Admit: 2018-01-13 | Discharge: 2018-01-13 | Disposition: A | Discharge status: Home / Self Care | Payer: Medicare Other | Source: Ambulatory Visit | Attending: Cardiology | Admitting: Cardiology

## 2018-01-13 DIAGNOSIS — I739 Peripheral vascular disease, unspecified: Secondary | ICD-10-CM | POA: Insufficient documentation

## 2018-01-13 DIAGNOSIS — R52 Pain, unspecified: Secondary | ICD-10-CM

## 2018-01-13 DIAGNOSIS — M7989 Other specified soft tissue disorders: Secondary | ICD-10-CM | POA: Diagnosis present

## 2018-01-13 NOTE — Progress Notes (Signed)
*  Preliminary Results* Bilateral lower extremity venous duplex completed. Bilateral lower extremities are negative for deep vein thrombosis. There is no evidence of Baker's cyst bilaterally.  Bilateral lower extremity arterial duplex completed. Right: Significant plaque morphology and dampened waveforms in the right lower extremity suggest peripheral vascular disease with possible proximal obstruction. There is no obvious evidence of hemodynamically significant stenosis involving the right lower extremity arteries.  Left: Significant plaque morphology and dampened waveforms in the left lower extremity suggest peripheral vascular disease with possible proximal obstruction. The superficial femoral artery is occluded in its mid to distal segments with reconstitution at the popliteal artery.    01/13/2018 2:14 PM Marvens Hollars, BS, RVT, RDCS

## 2018-01-14 ENCOUNTER — Ambulatory Visit (INDEPENDENT_AMBULATORY_CARE_PROVIDER_SITE_OTHER): Payer: Medicare Other | Admitting: "Endocrinology

## 2018-01-14 ENCOUNTER — Encounter: Payer: Self-pay | Admitting: "Endocrinology

## 2018-01-14 VITALS — BP 143/77 | HR 90 | Ht 66.5 in | Wt 186.0 lb

## 2018-01-14 DIAGNOSIS — I10 Essential (primary) hypertension: Secondary | ICD-10-CM

## 2018-01-14 DIAGNOSIS — E1159 Type 2 diabetes mellitus with other circulatory complications: Secondary | ICD-10-CM

## 2018-01-14 DIAGNOSIS — E782 Mixed hyperlipidemia: Secondary | ICD-10-CM | POA: Diagnosis not present

## 2018-01-14 NOTE — Patient Instructions (Signed)

## 2018-01-14 NOTE — Progress Notes (Signed)
Endocrinology follow-up note       01/14/2018, 3:32 PM   Subjective:    Patient ID: Dustin [name], male    DOB: 1941/03/03.  Dustin [name] is being seen in follow-up for management of currently uncontrolled symptomatic diabetes requested by  Dustin Fries, MD.   Past Medical History:  Diagnosis Date  . A-fib ([place])   . Arthritis    "hips" (12/15/2017)  . CKD (chronic kidney disease), stage III ([place])   . High cholesterol   . Hypertension   . OSA on CPAP   . Pneumonia ~ 2017 X1  . PVD (peripheral vascular disease) ([place]) 2008   Infreareanl abdominal aorta covered stent 11/2017. Prior b/l common iliac stents 2008, Lt EIA PTA 11/2017  . Type II diabetes mellitus ([place])    Past Surgical History:  Procedure Laterality Date  . ABDOMINAL AORTOGRAM N/A 12/08/2017   Procedure: ABDOMINAL AORTOGRAM;  Surgeon: Dustin Mormon, MD;  Location: [facility];  Service: Cardiovascular;  Laterality: N/A;  . CARDIAC CATHETERIZATION  1997; 09/2017  . COLONOSCOPY WITH PROPOFOL N/A 07/23/2017   Procedure: COLONOSCOPY WITH PROPOFOL;  Surgeon: Dustin Craver, MD;  Location: [facility];  Service: Endoscopy;  Laterality: N/A;  . CORONARY ANGIOPLASTY WITH STENT PLACEMENT  2008  . CORONARY ARTERY BYPASS GRAFT  1992   "CABG X3"  . LOWER EXTREMITY ANGIOGRAPHY N/A 12/08/2017   Procedure: LOWER EXTREMITY ANGIOGRAPHY;  Surgeon: Dustin Mormon, MD;  Location: [facility];  Service: Cardiovascular;  Laterality: N/A;  . LOWER EXTREMITY ANGIOGRAPHY N/A 12/15/2017   Procedure: LOWER EXTREMITY ANGIOGRAPHY - Left Leg;  Surgeon: Dustin Mormon, MD;  Location: [facility];  Service: Cardiovascular;  Laterality: N/A;  . PERIPHERAL VASCULAR BALLOON ANGIOPLASTY  12/08/2017   Procedure: PERIPHERAL VASCULAR BALLOON ANGIOPLASTY;  Surgeon: Dustin Mormon, MD;  Location: [facility];  Service:  Cardiovascular;;  right common iliac  . PERIPHERAL VASCULAR INTERVENTION  12/08/2017   Procedure: PERIPHERAL VASCULAR INTERVENTION;  Surgeon: Dustin Mormon, MD;  Location: [facility];  Service: Cardiovascular;;  abdominal aorta stent   Social History   Socioeconomic History  . Marital status: Married    Spouse name: Not on file  . Number of children: Not on file  . Years of education: Not on file  . Highest education level: Not on file  Occupational History  . Not on file  Social Needs  . Financial resource strain: Not on file  . Food insecurity:    Worry: Not on file    Inability: Not on file  . Transportation needs:    Medical: Not on file    Non-medical: Not on file  Tobacco Use  . Smoking status: Never Smoker  . Smokeless tobacco: Never Used  Substance and Sexual Activity  . Alcohol use: Never    Frequency: Never  . Drug use: Never  . Sexual activity: Not on file  Lifestyle  . Physical activity:    Days per week: Not on file    Minutes per session: Not on file  . Stress: Not on file  Relationships  .  Social connections:    Talks on phone: Not on file    Gets together: Not on file    Attends religious service: Not on file    Active member of club or organization: Not on file    Attends meetings of clubs or organizations: Not on file    Relationship status: Not on file  Other Topics Concern  . Not on file  Social History Narrative  . Not on file   Outpatient Encounter Medications as of 01/14/2018  Medication Sig  . Cholecalciferol (VITAMIN D3) 5000 UNITS CAPS Take 10,000 Units by mouth daily.   . clopidogrel (PLAVIX) 75 MG tablet Take 1 tablet (75 mg total) by mouth daily with breakfast.  . Cyanocobalamin (B-12) 5000 MCG CAPS Take 1 capsule by mouth 2 (two) times a week.   . furosemide (LASIX) 40 MG tablet Take 1 tablet (40 mg total) by mouth daily.  . Insulin Glargine (TOUJEO SOLOSTAR) 300 UNIT/ML SOPN Inject 46 Units into the skin at bedtime.  .  isosorbide-hydrALAZINE (BIDIL) 20-37.5 MG tablet Take 1 tablet by mouth 3 (three) times daily.  Dustin [name] linagliptin (TRADJENTA) 5 MG TABS tablet Take 5 mg by mouth every evening.   Dustin [name] lisinopril (PRINIVIL,ZESTRIL) 40 MG tablet Take 40 mg by mouth daily.   . Magnesium 250 MG TABS Take 250 mg by mouth 2 (two) times daily.   . metoprolol (LOPRESSOR) 50 MG tablet Take 50 mg by mouth 2 (two) times daily.  . nitroGLYCERIN (NITROSTAT) 0.4 MG SL tablet Place 0.4 mg under the tongue every 5 (five) minutes as needed for chest pain. Reported on 08/03/2015  . NON FORMULARY Take 1 tablet by mouth daily. Folate   . OVER THE COUNTER MEDICATION Take 1 tablet by mouth 2 (two) times daily. Nerve Renew Supplement  . potassium chloride SA (K-DUR,KLOR-CON) 20 MEQ tablet Take 20 mEq by mouth 2 (two) times daily.  . rivaroxaban (XARELTO) 20 MG TABS tablet Take 20 mg by mouth daily.   . rosuvastatin (CRESTOR) 20 MG tablet Take 20 mg by mouth at bedtime.  . timolol (TIMOPTIC) 0.5 % ophthalmic solution Place 1 drop into both eyes daily.  . Travoprost, BAK Free, (TRAVATAN) 0.004 % SOLN ophthalmic solution Place 1 drop into both eyes at bedtime.   . vitamin A 25000 UNIT capsule Take 25,000 Units by mouth daily.   No facility-administered encounter medications on file as of 01/14/2018.     ALLERGIES: No Known Allergies  VACCINATION STATUS: Immunization History  Administered Date(s) Administered  . Influenza Whole 03/30/2009  . Influenza-Unspecified 03/21/2015    Diabetes  He presents for his follow-up diabetic visit. He has type 2 diabetes mellitus. Onset time: Patient was diagnosed at approximate age of 62 years. His disease course has been worsening. There are no hypoglycemic associated symptoms. Pertinent negatives for hypoglycemia include no confusion, headaches, pallor or seizures. There are no diabetic associated symptoms. Pertinent negatives for diabetes include no chest pain, no fatigue, no polydipsia, no polyphagia,  no polyuria and no weakness. There are no hypoglycemic complications. Symptoms are worsening. Diabetic complications include heart disease, nephropathy and PVD. Risk factors for coronary artery disease include diabetes mellitus, dyslipidemia, family history, hypertension, male sex, obesity and sedentary lifestyle. Current diabetic treatment includes insulin injections and oral agent (monotherapy). He is compliant with treatment most of the time. His weight is increasing steadily. He is following a generally unhealthy diet. When asked about meal planning, he reported none. He has not had a previous visit with  a dietitian. He rarely (He has severe bilateral hip arthritis being considered for hip replacement, walks with walker, limited range of motion.) participates in exercise. (He did not bring any meter nor logs to review.  He did not do his previsit labs either.) An ACE inhibitor/angiotensin II receptor blocker is being taken. He sees a podiatrist.Eye exam is current.  Hyperlipidemia  This is a chronic problem. The current episode started more than 1 year ago. Exacerbating diseases include diabetes. Pertinent negatives include no chest pain, myalgias or shortness of breath. Risk factors for coronary artery disease include diabetes mellitus, dyslipidemia, male sex, a sedentary lifestyle and hypertension.  Hypertension  This is a chronic problem. The current episode started more than 1 year ago. The problem is controlled. Pertinent negatives include no chest pain, headaches, neck pain, palpitations or shortness of breath. Risk factors for coronary artery disease include dyslipidemia, diabetes mellitus, male gender, sedentary lifestyle and family history. Past treatments include ACE inhibitors. Hypertensive end-organ damage includes PVD.     Review of Systems  Constitutional: Negative for chills, fatigue, fever and unexpected weight change.  HENT: Negative for dental problem, mouth sores and trouble  swallowing.   Eyes: Negative for visual disturbance.  Respiratory: Negative for cough, choking, chest tightness, shortness of breath and wheezing.   Cardiovascular: Negative for chest pain, palpitations and leg swelling.  Gastrointestinal: Negative for abdominal distention, abdominal pain, constipation, diarrhea, nausea and vomiting.  Endocrine: Negative for polydipsia, polyphagia and polyuria.  Genitourinary: Negative for dysuria, flank pain, hematuria and urgency.  Musculoskeletal: Positive for gait problem. Negative for back pain, myalgias and neck pain.       Walks with a walker due to bilateral hip arthritis being considered for hip replacement.  Skin: Negative for pallor, rash and wound.  Neurological: Negative for seizures, syncope, weakness, numbness and headaches.  Psychiatric/Behavioral: Negative for confusion and dysphoric mood.    Objective:    BP (!) 143/77   Pulse 90   Ht 5' 6.5" (1.689 m)   Wt 186 lb (84.4 kg)   BMI 29.57 kg/m   Wt Readings from Last 3 Encounters:  01/14/18 186 lb (84.4 kg)  12/15/17 181 lb (82.1 kg)  12/09/17 181 lb 8 oz (82.3 kg)     Physical Exam  Constitutional: He is oriented to person, place, and time. He appears well-developed. He is cooperative. No distress.  HENT:  Head: Normocephalic and atraumatic.  Eyes: EOM are normal.  Neck: Normal range of motion. Neck supple. No tracheal deviation present. No thyromegaly present.  Cardiovascular: Normal rate, S1 normal and S2 normal. Exam reveals no gallop.  No murmur heard. Pulses:      Dorsalis pedis pulses are 1+ on the right side, and 1+ on the left side.       Posterior tibial pulses are 1+ on the right side, and 1+ on the left side.  Pulmonary/Chest: Effort normal. No respiratory distress. He has no wheezes.  Abdominal: He exhibits no distension. There is no tenderness. There is no guarding and no CVA tenderness.  Musculoskeletal: He exhibits no edema.       Right shoulder: He exhibits no  swelling and no deformity.  Walks with a walker due to bilateral hip arthritis being considered for hip replacement. He underwent bilateral lower extremity vascular surgery for peripheral arterial disease.  Neurological: He is alert and oriented to person, place, and time. He has normal strength and normal reflexes. No cranial nerve deficit or sensory deficit. Gait normal.  Skin: Skin is warm and dry. No rash noted. No cyanosis. Nails show no clubbing.  Psychiatric: He has a normal mood and affect. His speech is normal. Judgment normal. Cognition and memory are normal.   CMP Latest Ref Rng & Units 12/16/2017 12/09/2017 12/08/2017  Glucose 65 - 99 mg/dL 129(H) 158(H) 156(H)  BUN 6 - 20 mg/dL 21(H) 18 20  Creatinine 0.61 - 1.24 mg/dL 1.23 1.45(H) 1.56(H)  Sodium 135 - 145 mmol/L 137 138 136  Potassium 3.5 - 5.1 mmol/L 4.3 4.4 4.3  Chloride 101 - 111 mmol/L 109 110 109  CO2 22 - 32 mmol/L 23 24 23   Calcium 8.9 - 10.3 mg/dL 8.8(L) 8.5(L) 8.7(L)  Total Protein 6.4 - 8.3 g/dL - - -  Total Bilirubin 0.2 - 1.2 mg/dL - - -  Alkaline Phos 40 - 150 U/L - - -  AST 5 - 34 U/L - - -  ALT 0 - 55 U/L - - -    Lipid Panel     Component Value Date/Time   CHOL 204 (A) 10/13/2016   TRIG 165 (A) 10/13/2016   HDL 40 10/13/2016   LDLCALC 131 10/13/2016    A1c was 7.3% on September 18, 2017.  Assessment & Plan:   1. DM type 2 causing vascular disease status post coronary artery bypass graft and stage 3 renal insufficiency. - ARNAV CREGG has currently uncontrolled symptomatic type 2 DM since 77 years of age. -He missed his lab appointment.  He did not bring any meter nor logs to review.  His last A1c was 7.3% from September 18, 2017. -He will be sent to lab today. -his diabetes is complicated by coronary artery disease status post coronary artery bypass graft in 1992, stage I renal insufficiency, sedentary life due to severe bilateral hip arthritis and NATHON STEFANSKI remains at a high risk for more acute  and chronic complications which include CAD, CVA, CKD, retinopathy, and neuropathy. These are all discussed in detail with the patient.  - I have counseled him on diet management and weight loss, by adopting a carbohydrate restricted/protein rich diet.  -  Suggestion is made for him to avoid simple carbohydrates  from his diet including Cakes, Sweet Desserts / Pastries, Ice Cream, Soda (diet and regular), Sweet Tea, Candies, Chips, Cookies, Store Bought Juices, Alcohol in Excess of  1-2 drinks a day, Artificial Sweeteners, and "Sugar-free" Products. This will help patient to have stable blood glucose profile and potentially avoid unintended weight gain.   - I encouraged him to switch to  unprocessed or minimally processed complex starch and increased protein intake (animal or plant source), fruits, and vegetables.  - he is advised to stick to a routine mealtimes to eat 3 meals  a day and avoid unnecessary snacks ( to snack only to correct hypoglycemia).   - I have approached him with the following individualized plan to manage diabetes and patient agrees:   -He did not bring any meter nor logs to review, did not keep his up lab appointments.    -He will be sent for new set of labs including A1c and thyroid function test today.   -In the meantime, I advised him to continue Toujeo 46 units nightly, associated with strict monitoring of blood glucose 2 times a day-daily before breakfast and at bedtime and return in 1 week with his meter and logs for reevaluation. - Patient is warned not to take insulin without proper monitoring per orders. -Patient is encouraged to  call clinic for blood glucose levels less than 70 or above 200 mg /dl. - I will continue Tradjenta 5 mg p.o. every morning, therapeutically suitable for patient .  -Patient is not a candidate for metformin, SGLT2 inhibitors due to CKD.  - he will be considered for weekly injectable incretin therapy as appropriate next visit. - Patient  specific target  A1c;  LDL, HDL, Triglycerides, and  Waist Circumference were discussed in detail.  2) BP/HTN: His blood pressure is not controlled to target.  He is advised to continue his current blood pressure medications including hydralazine, lisinopril 40 mg p.o. daily, metoprolol 50 mg p.o. twice daily.    3) Lipids/HPL:   No recent lipid panel to review.  His lipid panel from October 13, 2016 showed LDL of 131.  He is advised to continue Crestor 20 mg p.o. nightly.   4)  Weight/Diet: CDE Consult has been initiated , exercise, and detailed carbohydrates information provided.  5) Chronic Care/Health Maintenance:  -he  is on ACEI/ARB and Statin medications and  is encouraged to continue to follow up with Ophthalmology, Dentist,  Podiatrist at least yearly or according to recommendations, and advised to  stay away from smoking. I have recommended yearly flu vaccine and pneumonia vaccination at least every 5 years; moderate intensity exercise for up to 150 minutes weekly; and  sleep for at least 7 hours a day.  -He is status post vascular surgery, waiting for hip surgery.  More comment on clearance on his return visit.  - I advised patient to maintain close follow up with Jaff, Salli Quarry, MD for primary care needs.  - Time spent with the patient: 25 min, of which >50% was spent in reviewing his  current and  previous labs and insulin doses and developing a plan to avoid hypo- and hyper-glycemia. Please refer to Patient Instructions for Blood Glucose Monitoring and Insulin/Medications Dosing Guide"  in media tab for additional information. Dustin [name] participated in the discussions, expressed understanding, and voiced agreement with the above plans.  All questions were answered to his satisfaction. he is encouraged to contact clinic should he have any questions or concerns prior to his return visit.  Follow up plan: - Return in about 1 week (around 01/21/2018) for follow up with pre-visit labs,  meter, and logs, labs today.  Glade Lloyd, MD [facility] [address] [place], [place] [address] Phone: [phone]  Fax: [phone]    01/14/2018, 3:32 PM  This note was partially dictated with voice recognition software. Similar sounding words can be transcribed inadequately or may not  be corrected upon review.

## 2018-01-18 ENCOUNTER — Encounter: Payer: Self-pay | Admitting: "Endocrinology

## 2018-01-20 LAB — T4, FREE

## 2018-01-20 LAB — HEMOGLOBIN A1C
Hgb A1c MFr Bld: 6.5 % of total Hgb — ABNORMAL HIGH (ref <5.7)
MEAN PLASMA GLUCOSE: 140 (calc)
eAG (mmol/L): 7.7 (calc)

## 2018-01-27 DIAGNOSIS — E669 Obesity, unspecified: Secondary | ICD-10-CM | POA: Insufficient documentation

## 2018-01-27 DIAGNOSIS — I259 Chronic ischemic heart disease, unspecified: Secondary | ICD-10-CM | POA: Insufficient documentation

## 2018-01-28 ENCOUNTER — Encounter: Payer: Self-pay | Admitting: "Endocrinology

## 2018-01-28 ENCOUNTER — Ambulatory Visit (INDEPENDENT_AMBULATORY_CARE_PROVIDER_SITE_OTHER): Payer: Medicare Other | Admitting: "Endocrinology

## 2018-01-28 VITALS — BP 113/64 | HR 75 | Ht 66.5 in | Wt 187.2 lb

## 2018-01-28 DIAGNOSIS — I10 Essential (primary) hypertension: Secondary | ICD-10-CM | POA: Diagnosis not present

## 2018-01-28 DIAGNOSIS — E1159 Type 2 diabetes mellitus with other circulatory complications: Secondary | ICD-10-CM | POA: Diagnosis not present

## 2018-01-28 DIAGNOSIS — E782 Mixed hyperlipidemia: Secondary | ICD-10-CM | POA: Diagnosis not present

## 2018-01-28 NOTE — Patient Instructions (Signed)

## 2018-01-28 NOTE — Progress Notes (Signed)
Endocrinology follow-up note       01/28/2018, 5:22 PM   Subjective:    Patient ID: Dustin [name], male    DOB: 1941-01-18.  Dustin [name] is being seen in follow-up for management of currently uncontrolled symptomatic diabetes requested by  Dustin Fries, MD.   Past Medical History:  Diagnosis Date  . A-fib ([place])   . Arthritis    "hips" (12/15/2017)  . CKD (chronic kidney disease), stage III ([place])   . High cholesterol   . Hypertension   . OSA on CPAP   . Pneumonia ~ 2017 X1  . PVD (peripheral vascular disease) ([place]) 2008   Infreareanl abdominal aorta covered stent 11/2017. Prior b/l common iliac stents 2008, Lt EIA PTA 11/2017  . Type II diabetes mellitus ([place])    Past Surgical History:  Procedure Laterality Date  . ABDOMINAL AORTOGRAM N/A 12/08/2017   Procedure: ABDOMINAL AORTOGRAM;  Surgeon: Dustin Mormon, MD;  Location: [facility];  Service: Cardiovascular;  Laterality: N/A;  . CARDIAC CATHETERIZATION  1997; 09/2017  . COLONOSCOPY WITH PROPOFOL N/A 07/23/2017   Procedure: COLONOSCOPY WITH PROPOFOL;  Surgeon: Dustin Craver, MD;  Location: [facility];  Service: Endoscopy;  Laterality: N/A;  . CORONARY ANGIOPLASTY WITH STENT PLACEMENT  2008  . CORONARY ARTERY BYPASS GRAFT  1992   "CABG X3"  . LOWER EXTREMITY ANGIOGRAPHY N/A 12/08/2017   Procedure: LOWER EXTREMITY ANGIOGRAPHY;  Surgeon: Dustin Mormon, MD;  Location: [facility];  Service: Cardiovascular;  Laterality: N/A;  . LOWER EXTREMITY ANGIOGRAPHY N/A 12/15/2017   Procedure: LOWER EXTREMITY ANGIOGRAPHY - Left Leg;  Surgeon: Dustin Mormon, MD;  Location: [facility];  Service: Cardiovascular;  Laterality: N/A;  . PERIPHERAL VASCULAR BALLOON ANGIOPLASTY  12/08/2017   Procedure: PERIPHERAL VASCULAR BALLOON ANGIOPLASTY;  Surgeon: Dustin Mormon, MD;  Location: [facility];  Service: Cardiovascular;;   right common iliac  . PERIPHERAL VASCULAR INTERVENTION  12/08/2017   Procedure: PERIPHERAL VASCULAR INTERVENTION;  Surgeon: Dustin Mormon, MD;  Location: [facility];  Service: Cardiovascular;;  abdominal aorta stent   Social History   Socioeconomic History  . Marital status: Married    Spouse name: Not on file  . Number of children: Not on file  . Years of education: Not on file  . Highest education level: Not on file  Occupational History  . Not on file  Social Needs  . Financial resource strain: Not on file  . Food insecurity:    Worry: Not on file    Inability: Not on file  . Transportation needs:    Medical: Not on file    Non-medical: Not on file  Tobacco Use  . Smoking status: Never Smoker  . Smokeless tobacco: Never Used  Substance and Sexual Activity  . Alcohol use: Never    Frequency: Never  . Drug use: Never  . Sexual activity: Not on file  Lifestyle  . Physical activity:    Days per week: Not on file    Minutes per session: Not on file  . Stress: Not on file  Relationships  .  Social connections:    Talks on phone: Not on file    Gets together: Not on file    Attends religious service: Not on file    Active member of club or organization: Not on file    Attends meetings of clubs or organizations: Not on file    Relationship status: Not on file  Other Topics Concern  . Not on file  Social History Narrative  . Not on file   Outpatient Encounter Medications as of 01/28/2018  Medication Sig  . Cholecalciferol (VITAMIN D3) 5000 UNITS CAPS Take 10,000 Units by mouth daily.   . Cyanocobalamin (B-12) 5000 MCG CAPS Take 1 capsule by mouth 2 (two) times a week.   . furosemide (LASIX) 40 MG tablet Take 1 tablet (40 mg total) by mouth daily.  . Insulin Glargine (TOUJEO SOLOSTAR) 300 UNIT/ML SOPN Inject 40 Units into the skin at bedtime.  . isosorbide-hydrALAZINE (BIDIL) 20-37.5 MG tablet Take 1 tablet by mouth 3 (three) times daily.  Dustin [name] lisinopril  (PRINIVIL,ZESTRIL) 40 MG tablet Take 40 mg by mouth daily.   . Magnesium 250 MG TABS Take 250 mg by mouth 2 (two) times daily.   . metoprolol (LOPRESSOR) 50 MG tablet Take 50 mg by mouth 2 (two) times daily.  . nitroGLYCERIN (NITROSTAT) 0.4 MG SL tablet Place 0.4 mg under the tongue every 5 (five) minutes as needed for chest pain. Reported on 08/03/2015  . NON FORMULARY Take 1 tablet by mouth daily. Folate   . OVER THE COUNTER MEDICATION Take 1 tablet by mouth 2 (two) times daily. Nerve Renew Supplement  . potassium chloride SA (K-DUR,KLOR-CON) 20 MEQ tablet Take 20 mEq by mouth 2 (two) times daily.  . rivaroxaban (XARELTO) 20 MG TABS tablet Take 20 mg by mouth daily.   . rosuvastatin (CRESTOR) 20 MG tablet Take 20 mg by mouth at bedtime.  . timolol (TIMOPTIC) 0.5 % ophthalmic solution Place 1 drop into both eyes daily.  . Travoprost, BAK Free, (TRAVATAN) 0.004 % SOLN ophthalmic solution Place 1 drop into both eyes at bedtime.   . vitamin A 25000 UNIT capsule Take 25,000 Units by mouth daily.  . [DISCONTINUED] linagliptin (TRADJENTA) 5 MG TABS tablet Take 5 mg by mouth every evening.   . clopidogrel (PLAVIX) 75 MG tablet Take 1 tablet (75 mg total) by mouth daily with breakfast. (Patient not taking: Reported on 01/28/2018)   No facility-administered encounter medications on file as of 01/28/2018.     ALLERGIES: No Known Allergies  VACCINATION STATUS: Immunization History  Administered Date(s) Administered  . DT 02/14/2004  . Influenza Whole 03/30/2009, 04/26/2011, 03/25/2012  . Influenza-Unspecified 05/30/1999, 05/07/2000, 05/07/2001, 04/13/2002, 04/15/2003, 04/25/2004, 05/20/2005, 04/30/2006, 05/06/2007, 06/01/2008, 03/16/2009, 03/21/2015, 04/08/2017  . Pneumococcal Conjugate-13 08/05/2013  . Pneumococcal-Unspecified 02/14/2004, 09/08/2011  . Zoster 12/02/2006    Diabetes  He presents for his follow-up diabetic visit. He has type 2 diabetes mellitus. Onset time: Patient was diagnosed at  approximate age of 50 years. His disease course has been improving. There are no hypoglycemic associated symptoms. Pertinent negatives for hypoglycemia include no confusion, headaches, pallor or seizures. There are no diabetic associated symptoms. Pertinent negatives for diabetes include no chest pain, no fatigue, no polydipsia, no polyphagia, no polyuria and no weakness. There are no hypoglycemic complications. Symptoms are improving. Diabetic complications include heart disease, nephropathy and PVD. Risk factors for coronary artery disease include diabetes mellitus, dyslipidemia, family history, hypertension, male sex, obesity and sedentary lifestyle. Current diabetic treatment includes insulin injections and oral  agent (monotherapy). He is compliant with treatment most of the time. His weight is increasing steadily. He is following a generally unhealthy diet. When asked about meal planning, he reported none. He has not had a previous visit with a dietitian. Exercise: He has severe bilateral hip arthritis being considered for hip replacement, walks with walker, limited range of motion. His breakfast blood glucose range is generally 130-140 mg/dl. His bedtime blood glucose range is generally 140-180 mg/dl. His overall blood glucose range is 140-180 mg/dl. (He did not bring any meter nor logs to review.  He did not do his previsit labs either.) An ACE inhibitor/angiotensin II receptor blocker is being taken. He sees a podiatrist.Eye exam is current.  Hyperlipidemia  This is a chronic problem. The current episode started more than 1 year ago. Exacerbating diseases include diabetes. Pertinent negatives include no chest pain, myalgias or shortness of breath. Risk factors for coronary artery disease include diabetes mellitus, dyslipidemia, male sex, a sedentary lifestyle and hypertension.  Hypertension  This is a chronic problem. The current episode started more than 1 year ago. The problem is controlled. Pertinent  negatives include no chest pain, headaches, neck pain, palpitations or shortness of breath. Risk factors for coronary artery disease include dyslipidemia, diabetes mellitus, male gender, sedentary lifestyle and family history. Past treatments include ACE inhibitors. Hypertensive end-organ damage includes PVD.    Review of Systems  Constitutional: Negative for chills, fatigue, fever and unexpected weight change.  HENT: Negative for dental problem, mouth sores and trouble swallowing.   Eyes: Negative for visual disturbance.  Respiratory: Negative for cough, choking, chest tightness, shortness of breath and wheezing.   Cardiovascular: Negative for chest pain, palpitations and leg swelling.  Gastrointestinal: Negative for abdominal distention, abdominal pain, constipation, diarrhea, nausea and vomiting.  Endocrine: Negative for polydipsia, polyphagia and polyuria.  Genitourinary: Negative for dysuria, flank pain, hematuria and urgency.  Musculoskeletal: Positive for gait problem. Negative for back pain, myalgias and neck pain.       Walks with a walker due to bilateral hip arthritis being considered for hip replacement.  Skin: Negative for pallor, rash and wound.  Neurological: Negative for seizures, syncope, weakness, numbness and headaches.  Psychiatric/Behavioral: Negative for confusion and dysphoric mood.    Objective:    BP 113/64   Pulse 75   Ht 5' 6.5" (1.689 m)   Wt 187 lb 4 oz (84.9 kg)   SpO2 97%   BMI 29.77 kg/m   Wt Readings from Last 3 Encounters:  01/28/18 187 lb 4 oz (84.9 kg)  01/14/18 186 lb (84.4 kg)  12/15/17 181 lb (82.1 kg)     Physical Exam  Constitutional: He is oriented to person, place, and time. He appears well-developed. He is cooperative. No distress.  HENT:  Head: Normocephalic and atraumatic.  Eyes: EOM are normal.  Neck: Normal range of motion. Neck supple. No tracheal deviation present. No thyromegaly present.  Cardiovascular: Normal rate, S1 normal  and S2 normal. Exam reveals no gallop.  No murmur heard. Pulses:      Dorsalis pedis pulses are 1+ on the right side, and 1+ on the left side.       Posterior tibial pulses are 1+ on the right side, and 1+ on the left side.  Pulmonary/Chest: Effort normal. No respiratory distress. He has no wheezes.  Abdominal: He exhibits no distension. There is no tenderness. There is no guarding and no CVA tenderness.  Musculoskeletal: He exhibits no edema.  Right shoulder: He exhibits no swelling and no deformity.  Walks with a walker due to bilateral hip arthritis being considered for hip replacement. He underwent bilateral lower extremity vascular surgery for peripheral arterial disease.  Neurological: He is alert and oriented to person, place, and time. He has normal strength. No cranial nerve deficit or sensory deficit. Gait normal.  Skin: Skin is warm and dry. No rash noted. No cyanosis. Nails show no clubbing.  Psychiatric: He has a normal mood and affect. His speech is normal. Judgment normal. Cognition and memory are normal.   CMP Latest Ref Rng & Units 12/16/2017 12/09/2017 12/08/2017  Glucose 65 - 99 mg/dL 129(H) 158(H) 156(H)  BUN 6 - 20 mg/dL 21(H) 18 20  Creatinine 0.61 - 1.24 mg/dL 1.23 1.45(H) 1.56(H)  Sodium 135 - 145 mmol/L 137 138 136  Potassium 3.5 - 5.1 mmol/L 4.3 4.4 4.3  Chloride 101 - 111 mmol/L 109 110 109  CO2 22 - 32 mmol/L 23 24 23   Calcium 8.9 - 10.3 mg/dL 8.8(L) 8.5(L) 8.7(L)  Total Protein 6.4 - 8.3 g/dL - - -  Total Bilirubin 0.2 - 1.2 mg/dL - - -  Alkaline Phos 40 - 150 U/L - - -  AST 5 - 34 U/L - - -  ALT 0 - 55 U/L - - -   Results for ONIS, MARKOFF (MRN [id number]) as of 01/28/2018 17:25  Ref. Range 01/14/2018 14:01  eAG (mmol/L) Latest Units: (calc) 7.7  Hemoglobin A1C Latest Ref Range: <5.7 % of total Hgb 6.5 (H)    Lipid Panel     Component Value Date/Time   CHOL 204 (A) 10/13/2016   TRIG 165 (A) 10/13/2016   HDL 40 10/13/2016   LDLCALC 131  10/13/2016     Assessment & Plan:   1. DM type 2 causing vascular disease status post coronary artery bypass graft and stage 3 renal insufficiency  - TWAN HARKIN has currently uncontrolled symptomatic type 2 DM since 77 years of age. -He presents with controlled glycemia, A1c 6.5% improving from 7.3%.    -his diabetes is complicated by coronary artery disease status post coronary artery bypass graft in 1992, stage I renal insufficiency, sedentary life due to severe bilateral hip arthritis and Dustin [name] remains at a high risk for more acute and chronic complications which include CAD, CVA, CKD, retinopathy, and neuropathy. These are all discussed in detail with the patient.  - I have counseled him on diet management and weight loss, by adopting a carbohydrate restricted/protein rich diet. -  Suggestion is made for him to avoid simple carbohydrates  from his diet including Cakes, Sweet Desserts / Pastries, Ice Cream, Soda (diet and regular), Sweet Tea, Candies, Chips, Cookies, Store Bought Juices, Alcohol in Excess of  1-2 drinks a day, Artificial Sweeteners, and "Sugar-free" Products. This will help patient to have stable blood glucose profile and potentially avoid unintended weight gain.'   - I encouraged him to switch to  unprocessed or minimally processed complex starch and increased protein intake (animal or plant source), fruits, and vegetables.  - he is advised to stick to a routine mealtimes to eat 3 meals  a day and avoid unnecessary snacks ( to snack only to correct hypoglycemia).   - I have approached him with the following individualized plan to manage diabetes and patient agrees:    -Based on his presentation with controlled glycemia and A1c of 6.5%, he is clear for his planned hip surgery. -I advised him  to lower her Toujeo to 40 units nightly, associated with strict monitoring of blood glucose 2 times a day-daily before breakfast and at bedtime and return in 1 week  with his meter and logs for reevaluation. - Patient is warned not to take insulin without proper monitoring per orders. -Patient is encouraged to call clinic for blood glucose levels less than 70 or above 200 mg /dl. -He is complaining of cost of Tradjenta, will be discontinued after he finishes his current supplies.    -Patient is not a candidate for metformin, SGLT2 inhibitors due to CKD.  - he will be considered for weekly injectable incretin therapy if necessary on subsequent visits.   - Patient specific target  A1c;  LDL, HDL, Triglycerides, and  Waist Circumference were discussed in detail.  2) BP/HTN: His blood pressure is controlled to target.  He is advised to continue his current blood pressure medications including hydralazine, lisinopril 40 mg p.o. daily, metoprolol 50 mg p.o. twice daily.    3) Lipids/HPL:   No recent lipid panel to review.  His lipid panel from October 13, 2016 showed LDL of 131.  He is advised to continue Crestor 20 mg p.o. nightly.   4)  Weight/Diet: CDE Consult has been initiated , exercise, and detailed carbohydrates information provided.  5) Chronic Care/Health Maintenance:  -he  is on ACEI/ARB and Statin medications and  is encouraged to continue to follow up with Ophthalmology, Dentist,  Podiatrist at least yearly or according to recommendations, and advised to  stay away from smoking. I have recommended yearly flu vaccine and pneumonia vaccination at least every 5 years; moderate intensity exercise for up to 150 minutes weekly; and  sleep for at least 7 hours a day.  -He is status post vascular surgery, waiting for hip surgery.  He is clear for planned surgery from diabetes point of view.    - I advised patient to maintain close follow up with Jaff, Salli Quarry, MD for primary care needs.  - Time spent with the patient: 25 min, of which >50% was spent in reviewing his blood glucose logs , discussing his hypo- and hyper-glycemic episodes, reviewing his current  and  previous labs and insulin doses and developing a plan to avoid hypo- and hyper-glycemia. Please refer to Patient Instructions for Blood Glucose Monitoring and Insulin/Medications Dosing Guide"  in media tab for additional information. Dustin [name] participated in the discussions, expressed understanding, and voiced agreement with the above plans.  All questions were answered to his satisfaction. he is encouraged to contact clinic should he have any questions or concerns prior to his return visit.   Follow up plan: - Return in about 3 months (around 04/30/2018) for Meter, and Logs.  Glade Lloyd, MD [facility] [address] [place], [place] [address] Phone: [phone]  Fax: [phone]    01/28/2018, 5:22 PM  This note was partially dictated with voice recognition software. Similar sounding words can be transcribed inadequately or may not  be corrected upon review.

## 2018-02-01 ENCOUNTER — Other Ambulatory Visit: Payer: Self-pay

## 2018-02-01 DIAGNOSIS — D472 Monoclonal gammopathy: Secondary | ICD-10-CM

## 2018-02-01 MED ORDER — INSULIN GLARGINE 300 UNIT/ML ~~LOC~~ SOPN: 40.0000 [IU] | PEN_INJECTOR | Freq: Every day | SUBCUTANEOUS | 2 refills | Status: DC

## 2018-03-09 ENCOUNTER — Telehealth: Payer: Self-pay | Admitting: "Endocrinology

## 2018-03-09 NOTE — Telephone Encounter (Signed)
Dustin [name] is calling asking if you need to put Dustin [name] back on the Trajenta his A1c has went back up to 7.6 and they are concerned, please advise?

## 2018-03-09 NOTE — Telephone Encounter (Signed)
We need to get some morning blood glucose readings.  If his blood glucose readings at fasting are greater than 130 mg/dl,  I would rather increase his insulin (Toujeo to 50 units nightly, than put him back on Tradjenta if necessary.

## 2018-03-27 ENCOUNTER — Encounter (HOSPITAL_COMMUNITY): Payer: Self-pay | Admitting: Family Medicine

## 2018-03-27 ENCOUNTER — Inpatient Hospital Stay (HOSPITAL_COMMUNITY)
Admission: AD | Admit: 2018-03-27 | Discharge: 2018-03-29 | DRG: 872 | Disposition: A | Discharge status: Home Health | Payer: Medicare Other | Source: Other Acute Inpatient Hospital | Attending: Internal Medicine | Admitting: Internal Medicine

## 2018-03-27 DIAGNOSIS — I5032 Chronic diastolic (congestive) heart failure: Secondary | ICD-10-CM | POA: Diagnosis not present

## 2018-03-27 DIAGNOSIS — I130 Hypertensive heart and chronic kidney disease with heart failure and stage 1 through stage 4 chronic kidney disease, or unspecified chronic kidney disease: Secondary | ICD-10-CM | POA: Diagnosis present

## 2018-03-27 DIAGNOSIS — Z9989 Dependence on other enabling machines and devices: Secondary | ICD-10-CM | POA: Diagnosis not present

## 2018-03-27 DIAGNOSIS — N183 Chronic kidney disease, stage 3 unspecified: Secondary | ICD-10-CM | POA: Diagnosis present

## 2018-03-27 DIAGNOSIS — E1159 Type 2 diabetes mellitus with other circulatory complications: Secondary | ICD-10-CM | POA: Diagnosis not present

## 2018-03-27 DIAGNOSIS — N179 Acute kidney failure, unspecified: Secondary | ICD-10-CM | POA: Diagnosis present

## 2018-03-27 DIAGNOSIS — I10 Essential (primary) hypertension: Secondary | ICD-10-CM | POA: Diagnosis not present

## 2018-03-27 DIAGNOSIS — I482 Chronic atrial fibrillation: Secondary | ICD-10-CM | POA: Diagnosis present

## 2018-03-27 DIAGNOSIS — E1151 Type 2 diabetes mellitus with diabetic peripheral angiopathy without gangrene: Secondary | ICD-10-CM | POA: Diagnosis present

## 2018-03-27 DIAGNOSIS — Z7902 Long term (current) use of antithrombotics/antiplatelets: Secondary | ICD-10-CM

## 2018-03-27 DIAGNOSIS — I4891 Unspecified atrial fibrillation: Secondary | ICD-10-CM | POA: Diagnosis present

## 2018-03-27 DIAGNOSIS — R32 Unspecified urinary incontinence: Secondary | ICD-10-CM | POA: Diagnosis present

## 2018-03-27 DIAGNOSIS — C9000 Multiple myeloma not having achieved remission: Secondary | ICD-10-CM | POA: Diagnosis present

## 2018-03-27 DIAGNOSIS — I472 Ventricular tachycardia: Secondary | ICD-10-CM | POA: Diagnosis present

## 2018-03-27 DIAGNOSIS — I2510 Atherosclerotic heart disease of native coronary artery without angina pectoris: Secondary | ICD-10-CM | POA: Diagnosis present

## 2018-03-27 DIAGNOSIS — E861 Hypovolemia: Secondary | ICD-10-CM | POA: Diagnosis present

## 2018-03-27 DIAGNOSIS — A419 Sepsis, unspecified organism: Secondary | ICD-10-CM | POA: Diagnosis present

## 2018-03-27 DIAGNOSIS — Z96641 Presence of right artificial hip joint: Secondary | ICD-10-CM | POA: Diagnosis present

## 2018-03-27 DIAGNOSIS — D472 Monoclonal gammopathy: Secondary | ICD-10-CM | POA: Diagnosis present

## 2018-03-27 DIAGNOSIS — Z951 Presence of aortocoronary bypass graft: Secondary | ICD-10-CM

## 2018-03-27 DIAGNOSIS — G4733 Obstructive sleep apnea (adult) (pediatric): Secondary | ICD-10-CM | POA: Diagnosis present

## 2018-03-27 DIAGNOSIS — E860 Dehydration: Secondary | ICD-10-CM | POA: Diagnosis present

## 2018-03-27 DIAGNOSIS — E7800 Pure hypercholesterolemia, unspecified: Secondary | ICD-10-CM | POA: Diagnosis present

## 2018-03-27 DIAGNOSIS — E039 Hypothyroidism, unspecified: Secondary | ICD-10-CM | POA: Diagnosis present

## 2018-03-27 DIAGNOSIS — Z955 Presence of coronary angioplasty implant and graft: Secondary | ICD-10-CM

## 2018-03-27 DIAGNOSIS — Z794 Long term (current) use of insulin: Secondary | ICD-10-CM

## 2018-03-27 DIAGNOSIS — N400 Enlarged prostate without lower urinary tract symptoms: Secondary | ICD-10-CM | POA: Diagnosis present

## 2018-03-27 DIAGNOSIS — E1122 Type 2 diabetes mellitus with diabetic chronic kidney disease: Secondary | ICD-10-CM | POA: Diagnosis present

## 2018-03-27 DIAGNOSIS — Z79899 Other long term (current) drug therapy: Secondary | ICD-10-CM

## 2018-03-27 DIAGNOSIS — I951 Orthostatic hypotension: Secondary | ICD-10-CM | POA: Diagnosis present

## 2018-03-27 DIAGNOSIS — R55 Syncope and collapse: Secondary | ICD-10-CM | POA: Diagnosis not present

## 2018-03-27 DIAGNOSIS — N390 Urinary tract infection, site not specified: Secondary | ICD-10-CM | POA: Diagnosis present

## 2018-03-27 DIAGNOSIS — E871 Hypo-osmolality and hyponatremia: Secondary | ICD-10-CM | POA: Diagnosis present

## 2018-03-27 LAB — MRSA PCR SCREENING: MRSA BY PCR: NEGATIVE

## 2018-03-27 LAB — GLUCOSE, CAPILLARY: Glucose-Capillary: 174 mg/dL — ABNORMAL HIGH (ref 70–99)

## 2018-03-27 MED ORDER — INSULIN GLARGINE 100 UNIT/ML ~~LOC~~ SOLN
25.0000 [IU] | Freq: Every day | SUBCUTANEOUS | Status: DC
  Administered 2018-03-27: 25 [IU] via SUBCUTANEOUS
  Filled 2018-03-27 (×3): qty 0.25

## 2018-03-27 MED ORDER — ACETAMINOPHEN 325 MG PO TABS
650.0000 mg | ORAL_TABLET | Freq: Four times a day (QID) | ORAL | Status: DC | PRN
  Administered 2018-03-28 – 2018-03-29 (×3): 650 mg via ORAL
  Filled 2018-03-27 (×3): qty 2

## 2018-03-27 MED ORDER — MORPHINE SULFATE (PF) 4 MG/ML IV SOLN
3.0000 mg | INTRAVENOUS | Status: DC | PRN
  Filled 2018-03-27: qty 1

## 2018-03-27 MED ORDER — HYDROCODONE-ACETAMINOPHEN 5-325 MG PO TABS
1.0000 | ORAL_TABLET | ORAL | Status: DC | PRN
  Administered 2018-03-27 – 2018-03-28 (×2): 1 via ORAL
  Filled 2018-03-27 (×3): qty 1

## 2018-03-27 MED ORDER — ISOSORB DINITRATE-HYDRALAZINE 20-37.5 MG PO TABS
1.0000 | ORAL_TABLET | Freq: Three times a day (TID) | ORAL | Status: DC
  Administered 2018-03-27 – 2018-03-29 (×5): 1 via ORAL
  Filled 2018-03-27 (×5): qty 1

## 2018-03-27 MED ORDER — TIMOLOL MALEATE 0.5 % OP SOLN
1.0000 [drp] | Freq: Every day | OPHTHALMIC | Status: DC
  Administered 2018-03-28 – 2018-03-29 (×2): 1 [drp] via OPHTHALMIC
  Filled 2018-03-27: qty 5

## 2018-03-27 MED ORDER — RIVAROXABAN 15 MG PO TABS
15.0000 mg | ORAL_TABLET | Freq: Every day | ORAL | Status: DC
  Administered 2018-03-27 – 2018-03-28 (×2): 15 mg via ORAL
  Filled 2018-03-27 (×2): qty 1

## 2018-03-27 MED ORDER — ROSUVASTATIN CALCIUM 20 MG PO TABS
20.0000 mg | ORAL_TABLET | Freq: Every day | ORAL | Status: DC
  Administered 2018-03-27 – 2018-03-28 (×2): 20 mg via ORAL
  Filled 2018-03-27 (×2): qty 1

## 2018-03-27 MED ORDER — VITAMIN B-12 1000 MCG PO TABS
5000.0000 ug | ORAL_TABLET | ORAL | Status: DC
  Administered 2018-03-29: 5000 ug via ORAL
  Filled 2018-03-27: qty 5

## 2018-03-27 MED ORDER — INSULIN ASPART 100 UNIT/ML ~~LOC~~ SOLN
0.0000 [IU] | Freq: Three times a day (TID) | SUBCUTANEOUS | Status: DC
  Administered 2018-03-28: 3 [IU] via SUBCUTANEOUS
  Administered 2018-03-29: 2 [IU] via SUBCUTANEOUS

## 2018-03-27 MED ORDER — LATANOPROST 0.005 % OP SOLN
1.0000 [drp] | Freq: Every day | OPHTHALMIC | Status: DC
  Administered 2018-03-27 – 2018-03-28 (×2): 1 [drp] via OPHTHALMIC
  Filled 2018-03-27: qty 2.5

## 2018-03-27 MED ORDER — ONDANSETRON HCL 4 MG PO TABS: 4.0000 mg | ORAL_TABLET | Freq: Four times a day (QID) | ORAL | Status: DC | PRN

## 2018-03-27 MED ORDER — METOPROLOL TARTRATE 50 MG PO TABS
50.0000 mg | ORAL_TABLET | Freq: Two times a day (BID) | ORAL | Status: DC
  Administered 2018-03-27 – 2018-03-29 (×4): 50 mg via ORAL
  Filled 2018-03-27 (×4): qty 1

## 2018-03-27 MED ORDER — ONDANSETRON HCL 4 MG/2ML IJ SOLN: 4.0000 mg | Freq: Four times a day (QID) | INTRAMUSCULAR | Status: DC | PRN

## 2018-03-27 MED ORDER — SENNOSIDES-DOCUSATE SODIUM 8.6-50 MG PO TABS: 1.0000 | ORAL_TABLET | Freq: Every evening | ORAL | Status: DC | PRN

## 2018-03-27 MED ORDER — SODIUM CHLORIDE 0.9 % IV SOLN
1.0000 g | INTRAVENOUS | Status: DC
  Administered 2018-03-27 – 2018-03-28 (×2): 1 g via INTRAVENOUS
  Filled 2018-03-27 (×2): qty 1

## 2018-03-27 MED ORDER — ACETAMINOPHEN 650 MG RE SUPP: 650.0000 mg | Freq: Four times a day (QID) | RECTAL | Status: DC | PRN

## 2018-03-27 MED ORDER — SODIUM CHLORIDE 0.9% FLUSH
3.0000 mL | Freq: Two times a day (BID) | INTRAVENOUS | Status: DC
  Administered 2018-03-27 – 2018-03-28 (×2): 3 mL via INTRAVENOUS

## 2018-03-27 MED ORDER — SODIUM CHLORIDE 0.9 % IV SOLN
INTRAVENOUS | Status: AC
  Administered 2018-03-27: 21:00:00 via INTRAVENOUS

## 2018-03-27 MED ORDER — INSULIN ASPART 100 UNIT/ML ~~LOC~~ SOLN: 0.0000 [IU] | Freq: Every day | SUBCUTANEOUS | Status: DC

## 2018-03-27 NOTE — Progress Notes (Signed)
Pt has arrived, messaged admitting.   Eleanora Neighbor, RN

## 2018-03-27 NOTE — Progress Notes (Signed)
Pharmacy Antibiotic Note  Dustin [name] is a 77 y.o. male with UTI.  Pharmacy has been consulted for cefepime dosing. -last SCr= 1.2 in 11/2017, CrCl ~ 50  Plan: -Cefepime 1gm IV q24hr -Will follow renal function and clinical progress   No data recorded.  No results for input(s): WBC, CREATININE, LATICACIDVEN, VANCOTROUGH, VANCOPEAK, VANCORANDOM, GENTTROUGH, GENTPEAK, GENTRANDOM, TOBRATROUGH, TOBRAPEAK, TOBRARND, AMIKACINPEAK, AMIKACINTROU, AMIKACIN in the last 168 hours.  CrCl cannot be calculated (Patient's most recent lab result is older than the maximum 21 days allowed.).    No Known Allergies  Antimicrobials this admission: 9/28 cefepime>>  Dose adjustments this admission:   Microbiology results:   Thank you for allowing pharmacy to be a part of this patient's care. Hildred Laser, PharmD Clinical Pharmacist Please check Amion for pharmacy contact number     03/27/2018 8:57 PM

## 2018-03-27 NOTE — H&P (Signed)
History and Physical    Dustin [name] OAC:[id number] DOB: December 19, 1940 DOA: 03/27/2018  PCP: Robley Fries, MD   Patient coming from: SNF, by way of [facility] ED   Chief Complaint: Near-syncope   HPI: Dustin [name] is a 77 y.o. male with medical history significant for atrial fibrillation on Xarelto, coronary artery disease, chronic kidney disease stage III, insulin-dependent diabetes mellitus, OSA on CPAP, and right total hip replacement on 03/19/2018, who presented to the emergency department after a near syncopal episode at his SNF.  Patient underwent total hip replacement on the right about a week ago at [facility], has been at a SNF since that time and doing fairly well until earlier today when he was working with PT, stop to use the restroom, and developed acute lightheadedness with near syncope as he was being helped up from the toilet, did not completely lose consciousness, was helped back down by the physical therapist, and noted to have a systolic blood pressure in the low 90s at that time.  There was no shaking or jerking noted and patient's wife reports that he seemed to return to his usual state within about 5 minutes.  Patient has not noted any fevers or chills recently, but has had some generalized weakness and malaise since his surgery.  Denies any chest pain or palpitations.  Reports suprapubic tenderness that developed earlier today, reports chronic urinary incontinence, and his wife has noted recent polyuria.  He was transported to the ED at [facility] [place].  He was given IV fluids prior to arrival in the ED.  [facility] [place] ED Course: Upon arrival to the ED, patient is found to be afebrile, saturating well on room air, and with vitals otherwise normal.  EKG features atrial fibrillation with inferior T wave abnormality.  Chemistry panel is notable for a sodium of 131 and creatinine 1.85, up from an apparent baseline of roughly 1.3.  CBC features a  leukocytosis to 14,800 and a chronic stable normocytic anemia.  Lactic acid was normal at 1.3.  Urinalysis was suggestive of infection.  Blood cultures were collected, a liter of normal saline was administered, and the patient was treated with 1 g IV Rocephin.  Family reports that most of his care is here in [place] and requested admission to [facility] for further evaluation and management of sepsis secondary to UTI.   Review of Systems:  All other systems reviewed and apart from HPI, are negative.  Past Medical History:  Diagnosis Date  . A-fib ([place])   . Arthritis    "hips" (12/15/2017)  . CKD (chronic kidney disease), stage III ([place])   . High cholesterol   . Hypertension   . OSA on CPAP   . Pneumonia ~ 2017 X1  . PVD (peripheral vascular disease) ([place]) 2008   Infreareanl abdominal aorta covered stent 11/2017. Prior b/l common iliac stents 2008, Lt EIA PTA 11/2017  . Type II diabetes mellitus ([place])     Past Surgical History:  Procedure Laterality Date  . ABDOMINAL AORTOGRAM N/A 12/08/2017   Procedure: ABDOMINAL AORTOGRAM;  Surgeon: Nigel Mormon, MD;  Location: [place] CV LAB;  Service: Cardiovascular;  Laterality: N/A;  . CARDIAC CATHETERIZATION  1997; 09/2017  . COLONOSCOPY WITH PROPOFOL N/A 07/23/2017   Procedure: COLONOSCOPY WITH PROPOFOL;  Surgeon: Juanita Craver, MD;  Location: [facility];  Service: Endoscopy;  Laterality: N/A;  . CORONARY ANGIOPLASTY WITH STENT PLACEMENT  2008  . CORONARY ARTERY BYPASS GRAFT  1992   "CABG X3"  . LOWER EXTREMITY ANGIOGRAPHY N/A 12/08/2017   Procedure: LOWER EXTREMITY ANGIOGRAPHY;  Surgeon: Nigel Mormon, MD;  Location: [place] [facility];  Service: Cardiovascular;  Laterality: N/A;  . LOWER EXTREMITY ANGIOGRAPHY N/A 12/15/2017   Procedure: LOWER EXTREMITY ANGIOGRAPHY - Left Leg;  Surgeon: Nigel Mormon, MD;  Location: [place] [facility];  Service: Cardiovascular;  Laterality: N/A;  . PERIPHERAL VASCULAR BALLOON  ANGIOPLASTY  12/08/2017   Procedure: PERIPHERAL VASCULAR BALLOON ANGIOPLASTY;  Surgeon: Nigel Mormon, MD;  Location: [place] [facility];  Service: Cardiovascular;;  right common iliac  . PERIPHERAL VASCULAR INTERVENTION  12/08/2017   Procedure: PERIPHERAL VASCULAR INTERVENTION;  Surgeon: Nigel Mormon, MD;  Location: [place] [facility];  Service: Cardiovascular;;  abdominal aorta stent     reports that he has never smoked. He has never used smokeless tobacco. He reports that he does not drink alcohol or use drugs.  No Known Allergies  Family History  Problem Relation Age of Onset  . Heart failure Mother   . Heart failure Father   . Diabetes Sister   . Congenital heart disease Sister   . Cancer Brother 22       prostate and colon cancer      Prior to Admission medications   Medication Sig Start Date End Date Taking? Authorizing Provider  Cholecalciferol (VITAMIN D3) 5000 UNITS CAPS Take 10,000 Units by mouth daily.     [provider]  clopidogrel (PLAVIX) 75 MG tablet Take 1 tablet (75 mg total) by mouth daily with breakfast. Patient not taking: Reported on 01/28/2018 12/10/17   Patwardhan, Reynold Bowen, MD  Cyanocobalamin (B-12) 5000 MCG CAPS Take 1 capsule by mouth 2 (two) times a week.     [provider]  furosemide (LASIX) 40 MG tablet Take 1 tablet (40 mg total) by mouth daily. 12/16/17   Patwardhan, Reynold Bowen, MD  Insulin Glargine (TOUJEO SOLOSTAR) 300 UNIT/ML SOPN Inject 40 Units into the skin at bedtime. 02/01/18   Cassandria Anger, MD  isosorbide-hydrALAZINE (BIDIL) 20-37.5 MG tablet Take 1 tablet by mouth 3 (three) times daily.    [provider]  lisinopril (PRINIVIL,ZESTRIL) 40 MG tablet Take 40 mg by mouth daily.     [provider]  Magnesium 250 MG TABS Take 250 mg by mouth 2 (two) times daily.     [provider]  metoprolol (LOPRESSOR) 50 MG tablet Take 50 mg by mouth 2 (two) times daily.    [provider]   nitroGLYCERIN (NITROSTAT) 0.4 MG SL tablet Place 0.4 mg under the tongue every 5 (five) minutes as needed for chest pain. Reported on 08/03/2015 03/24/14   [provider]  NON FORMULARY Take 1 tablet by mouth daily. Folate     [provider]  OVER THE COUNTER MEDICATION Take 1 tablet by mouth 2 (two) times daily. Nerve Renew Supplement    [provider]  potassium chloride SA (K-DUR,KLOR-CON) 20 MEQ tablet Take 20 mEq by mouth 2 (two) times daily.    [provider]  rivaroxaban (XARELTO) 20 MG TABS tablet Take 20 mg by mouth daily.     [provider]  rosuvastatin (CRESTOR) 20 MG tablet Take 20 mg by mouth at bedtime.    [provider]  timolol (TIMOPTIC) 0.5 % ophthalmic solution Place 1 drop into both eyes daily.    [provider]  Travoprost, BAK Free, (TRAVATAN) 0.004 % SOLN ophthalmic solution  Place 1 drop into both eyes at bedtime.     [provider]  vitamin A 25000 UNIT capsule Take 25,000 Units by mouth daily.    [provider]    Physical Exam: There were no vitals filed for this visit.   Constitutional: NAD, calm  Eyes: PERTLA, lids and conjunctivae normal ENMT: Mucous membranes are moist. Posterior pharynx clear of any exudate or lesions.   Neck: normal, supple, no masses, no thyromegaly Respiratory: clear to auscultation bilaterally, no wheezing, no crackles. Normal respiratory effort.   Cardiovascular: Rate ~60 and irregular. No extremity edema.   Abdomen: No distension, soft, suprapubic tenderness. Bowel sounds active.  Musculoskeletal: no clubbing / cyanosis. No joint deformity upper and lower extremities.   Skin: no significant rashes, lesions, ulcers. Warm, dry, well-perfused. Neurologic: No facial asymmetry. Sensation intac. Moving all extremities.  Psychiatric: Alert and oriented to person, place, and situation. Calm, cooperative.    Labs on Admission: I have personally reviewed  following labs and imaging studies  CBC: No results for input(s): WBC, NEUTROABS, HGB, HCT, MCV, PLT in the last 168 hours. Basic Metabolic Panel: No results for input(s): NA, K, CL, CO2, GLUCOSE, BUN, CREATININE, CALCIUM, MG, PHOS in the last 168 hours. GFR: CrCl cannot be calculated (Patient's most recent lab result is older than the maximum 21 days allowed.). Liver Function Tests: No results for input(s): AST, ALT, ALKPHOS, BILITOT, PROT, ALBUMIN in the last 168 hours. No results for input(s): LIPASE, AMYLASE in the last 168 hours. No results for input(s): AMMONIA in the last 168 hours. Coagulation Profile: No results for input(s): INR, PROTIME in the last 168 hours. Cardiac Enzymes: No results for input(s): CKTOTAL, CKMB, CKMBINDEX, TROPONINI in the last 168 hours. BNP (last 3 results) No results for input(s): PROBNP in the last 8760 hours. HbA1C: No results for input(s): HGBA1C in the last 72 hours. CBG: No results for input(s): GLUCAP in the last 168 hours. Lipid Profile: No results for input(s): CHOL, HDL, LDLCALC, TRIG, CHOLHDL, LDLDIRECT in the last 72 hours. Thyroid Function Tests: No results for input(s): TSH, T4TOTAL, FREET4, T3FREE, THYROIDAB in the last 72 hours. Anemia Panel: No results for input(s): VITAMINB12, FOLATE, FERRITIN, TIBC, IRON, RETICCTPCT in the last 72 hours. Urine analysis:    Component Value Date/Time   COLORURINE YELLOW 07/04/2015 2102   APPEARANCEUR CLEAR 07/04/2015 2102   LABSPEC 1.021 07/04/2015 2102   PHURINE 5.0 07/04/2015 2102   GLUCOSEU >1000 (A) 07/04/2015 2102   HGBUR SMALL (A) 07/04/2015 2102   BILIRUBINUR NEGATIVE 07/04/2015 2102   [place] NEGATIVE 07/04/2015 2102   PROTEINUR NEGATIVE 07/04/2015 2102   NITRITE NEGATIVE 07/04/2015 2102   LEUKOCYTESUR SMALL (A) 07/04/2015 2102   Sepsis Labs: @LABRCNTIP (procalcitonin:4,lacticidven:4) )No results found for this or any previous visit (from the past 240 hour(s)).   Radiological  Exams on Admission: No results found.  EKG: Independently reviewed. Atrial fibrillation, inferior T-wave abnormality.    Assessment/Plan   1. Sepsis secondary to UTI  - Presents to ED after near-syncopal episode upon standing, noted by EMS to have low BP, and was treated with IVF prior to arrival in ED  - Found to have leukocytosis, AKI, and UA suggestive of infection; he reports suprapubic tenderness  - Lactic acid reassuring at 1.3  - Blood cultures were collected in ED, additional 1 liter NS was given, and he was treated with empiric Rocephin  - Send urine for culture, continue empiric abx, continue supportive care, follow cultures and clinical course  2. Acute kidney injury superimposed on CKD III  - SCr is 1.85 in ED, up from 1.3 earlier this month  - Likely prerenal in setting of sepsis and apparent hypovolemia  - Treated with IVF with EMS and in ED  - Continue IVF hydration, hold Lasix and lisinopril, repeat chem panel in am   3. Near-syncope  - Occurred just prior to arrival in ED while getting up from toilet, was associated with drop in SBP to low 90's, and he returned to his usual state within 5 minutes per report of family  - Likely orthostatic and secondary to sepsis with hypovolemia  - BP has remained stable since he received IVF with EMS and in ED  - Continue cardiac monitoring, continue gentle IVF hydration, check orthostatics    4. Chronic atrial fibrillation  - In rate-controlled a fib on admission  - CHADS-VASc 6 (age x2, CAD, CHF, DM, HTN)  - Continue Xarelto and beta-blocker    5. Chronic diastolic CHF  - Appears hypovolemic on admission; he received a liter NS in ED and IVF with EMS  - Hold Lasix, continue beta-blocker as tolerated, hold ACE in light of AKI, follow daily wt and I/O's    6. CAD  - No anginal complaints  - Continue Lopressor, Bidil, and statin as tolerated   7. Hyponatremia  - Serum sodium is 131 in ED in setting of hypovolemia  - Treated  with IVF by EMS, given additional 1 liter NS in ED, and will be continued on gentle IVF hydration overnight  - Hold Lasix, repeat chem panel in am   8. Normocytic anemia; MGUS  - Hgb is stable at 10.8  - Patient denies any bleeding   9. Status-post right total hip arthroplasty   - Patient underwent elective right THA on 03/19/18 at [place] to be stable, continue PT    DVT prophylaxis: Xarelto  Code Status: Full  Family Communication: Wife updated at bedside Consults called: None Admission status: Inpatient     Vianne Bulls, MD [facility] Pager [phone]  If 7PM-7AM, please contact night-coverage [email or web address] Password [facility]  03/27/2018, 8:36 PM

## 2018-03-28 DIAGNOSIS — I4891 Unspecified atrial fibrillation: Secondary | ICD-10-CM

## 2018-03-28 DIAGNOSIS — A419 Sepsis, unspecified organism: Principal | ICD-10-CM

## 2018-03-28 DIAGNOSIS — N179 Acute kidney failure, unspecified: Secondary | ICD-10-CM

## 2018-03-28 DIAGNOSIS — N390 Urinary tract infection, site not specified: Secondary | ICD-10-CM

## 2018-03-28 DIAGNOSIS — N183 Chronic kidney disease, stage 3 (moderate): Secondary | ICD-10-CM

## 2018-03-28 LAB — BASIC METABOLIC PANEL
ANION GAP: 8 (ref 5–15)
BUN: 31 mg/dL — AB (ref 8–23)
CHLORIDE: 103 mmol/L (ref 98–111)
CO2: 21 mmol/L — ABNORMAL LOW (ref 22–32)
Calcium: 8.5 mg/dL — ABNORMAL LOW (ref 8.9–10.3)
Creatinine, Ser: 1.52 mg/dL — ABNORMAL HIGH (ref 0.61–1.24)
GFR calc Af Amer: 49 mL/min — ABNORMAL LOW (ref >60)
GFR calc non Af Amer: 42 mL/min — ABNORMAL LOW (ref >60)
Glucose, Bld: 183 mg/dL — ABNORMAL HIGH (ref 70–99)
POTASSIUM: 4.8 mmol/L (ref 3.5–5.1)
SODIUM: 132 mmol/L — AB (ref 135–145)

## 2018-03-28 LAB — CBC WITH DIFFERENTIAL/PLATELET
Abs Immature Granulocytes: 0.3 10*3/uL — ABNORMAL HIGH (ref 0.0–0.1)
BASOS ABS: 0.1 10*3/uL (ref 0.0–0.1)
Basophils Relative: 1 %
EOS PCT: 1 %
Eosinophils Absolute: 0.2 10*3/uL (ref 0.0–0.7)
HEMATOCRIT: 31.2 % — AB (ref 39.0–52.0)
HEMOGLOBIN: 10.2 g/dL — AB (ref 13.0–17.0)
IMMATURE GRANULOCYTES: 2 %
LYMPHS ABS: 2.2 10*3/uL (ref 0.7–4.0)
LYMPHS PCT: 16 %
MCH: 30.4 pg (ref 26.0–34.0)
MCHC: 32.7 g/dL (ref 30.0–36.0)
MCV: 93.1 fL (ref 78.0–100.0)
Monocytes Absolute: 1.2 10*3/uL — ABNORMAL HIGH (ref 0.1–1.0)
Monocytes Relative: 9 %
Neutro Abs: 10.2 10*3/uL — ABNORMAL HIGH (ref 1.7–7.7)
Neutrophils Relative %: 71 %
Platelets: 331 10*3/uL (ref 150–400)
RBC: 3.35 MIL/uL — AB (ref 4.22–5.81)
RDW: 13.5 % (ref 11.5–15.5)
WBC: 14.1 10*3/uL — AB (ref 4.0–10.5)

## 2018-03-28 LAB — GLUCOSE, CAPILLARY
GLUCOSE-CAPILLARY: 137 mg/dL — AB (ref 70–99)
GLUCOSE-CAPILLARY: 147 mg/dL — AB (ref 70–99)
GLUCOSE-CAPILLARY: 226 mg/dL — AB (ref 70–99)
GLUCOSE-CAPILLARY: 136 mg/dL — AB (ref 70–99)

## 2018-03-28 MED ORDER — FINASTERIDE 5 MG PO TABS
5.0000 mg | ORAL_TABLET | Freq: Every day | ORAL | Status: DC
  Administered 2018-03-28 – 2018-03-29 (×2): 5 mg via ORAL
  Filled 2018-03-28 (×2): qty 1

## 2018-03-28 MED ORDER — OXYCODONE HCL 5 MG PO TABS
5.0000 mg | ORAL_TABLET | Freq: Four times a day (QID) | ORAL | Status: DC | PRN
  Administered 2018-03-28 – 2018-03-29 (×5): 5 mg via ORAL
  Filled 2018-03-28 (×5): qty 1

## 2018-03-28 NOTE — Progress Notes (Signed)
[facility]                                                                              Patient Demographics  Dustin [name], is a 77 y.o. male, DOB - Oct 28, 1940, NTI:[id number]  Admit date - 03/27/2018   Admitting Physician Dustin Bulls, MD  Outpatient Primary MD for the patient is Dustin [name], Dustin Quarry, MD  Outpatient specialists:   LOS - 1  days   Medical records reviewed and are as summarized below:    No chief complaint on file.      Brief summary   Patient is a 77 year old male with A. 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 Patient underwent total hip replacement on the right about a week ago at [facility], has been at a SNF since that time and doing fairly well until earlier today when he was working with PT, stop to use the restroom, and developed acute lightheadedness with near syncope as he was being helped up from the toilet, did not completely lose consciousness, was helped back down by the physical therapist, and noted to have a systolic blood pressure in the low 90s at that time.  Reports suprapubic tenderness that developed earlier today, reports chronic urinary incontinence, and his wife has noted recent polyuria.  He was transported to the ED at [name].  He was given IV fluids prior to arrival in the ED. Creatinine 1.8, leukocytosis 14.8, lactic acid 1.3, UA positive for UTI, patient was admitted for further work-up.  Assessment & Plan    Principal Problem:  Sepsis secondary to UTI -Presented with hypotension, leukocytosis, acute kidney injury, UA suggestive of UTI -Follow urine culture and sensitivities, blood cultures, continue IV cefepime -Continue IV fluids.  If no improvement in the symptoms or creatinine not trending down, will obtain renal ultrasound in a.m.  Active problems Acute kidney injury  on chronic kidney disease stage III -Likely prerenal in the setting of sepsis, hypovolemia and UTI.  Creatinine 1.8 on admission, 1.3 earlier this month - Improving, continue IV fluid hydration, creatinine 1.5 today  Near-syncope -Likely due to sepsis, was orthostatic and hypotensive in ED. -Hold antihypertensives including Lasix and lisinopril -For now continue BiDil -Per patient's wife, they have contacted their cardiologist, Dr. Einar [name] who will be seeing him today  Chronic atrial fibrillation Rate controlled, [place] vasc 6 -Continue beta-blocker.  Continue Xarelto  Chronic diastolic CHF -Patient was hypovolemic and dehydrated in the ED -Hold Lasix until creatinine function at baseline -Continue beta-blocker -Follow I's and O's  CAD -No complaints of chest pain or shortness of breath. Continue Lopressor, BiDil, statin  Hyponatremia -Sodium 131 on admission, improved to 132 this morning -Hold Lasix today, on gentle hydration   History of normocytic anemia, MGUS H&H stable  BPH Per patient he was on Flomax before and did not "work".  He is having symptoms of urinary urgency, hesitancy.  Placed on finasteride. If no significant improvement in creatinine function or spikes any fevers, will obtain renal  ultrasound  Status post right total hip arthroplasty Patient underwent elective right hip arthroplasty on 9/20 at [facility] physical therapy  Code Status: Full code DVT Prophylaxis: Xarelto Family Communication: Discussed in detail with the patient, all imaging results, lab results explained to the patient and wife   Disposition Plan: Patient needs to remain inpatient, following urine cultures, creatinine trend.  Back to skilled nursing facility if creatinine is improving, patient feeling back to baseline likely in a.m.  Time Spent in minutes 35 minutes  Procedures:  None  Consultants:   None  Antimicrobials:  IV cefepime  Medications  Scheduled Meds: . insulin  aspart  0-5 Units Subcutaneous QHS  . insulin aspart  0-9 Units Subcutaneous TID WC  . insulin glargine  25 Units Subcutaneous QHS  . isosorbide-hydrALAZINE  1 tablet Oral TID  . latanoprost  1 drop Both Eyes QHS  . metoprolol tartrate  50 mg Oral BID  . Rivaroxaban  15 mg Oral Q supper  . rosuvastatin  20 mg Oral q1800  . sodium chloride flush  3 mL Intravenous Q12H  . timolol  1 drop Both Eyes Daily  . [START ON 03/29/2018] vitamin B-12  5,000 mcg Oral Once per day on Mon Thu   Continuous Infusions: . ceFEPime (MAXIPIME) IV 1 g (03/27/18 2300)   PRN Meds:.acetaminophen **OR** acetaminophen, HYDROcodone-acetaminophen, ondansetron **OR** ondansetron (ZOFRAN) IV, oxyCODONE, senna-docusate   Antibiotics   Anti-infectives (From admission, onward)   Start     Dose/Rate Route Frequency Ordered Stop   03/27/18 2200  ceFEPIme (MAXIPIME) 1 g in sodium chloride 0.9 % 100 mL IVPB     1 g 200 mL/hr over 30 Minutes Intravenous Every 24 hours 03/27/18 2102          Subjective:   Dustin [name] was seen and examined today.  Feeling somewhat better today, denies any specific complaints.  No dizziness, lightheadedness. Patient denies  chest pain, shortness of breath, abdominal pain, N/V/D/C, new weakness, numbess, tingling.   Objective:   Vitals:   03/27/18 2155 03/28/18 0755 03/28/18 1028  BP: (!) 146/64 (!) 155/63   Pulse: 70 (!) 58 86  Resp: 18 18   Temp: 99.2 F (37.3 C) 99.3 F (37.4 C)   TempSrc: Oral Oral   SpO2: 98% 100%     Intake/Output Summary (Last 24 hours) at 03/28/2018 1202 Last data filed at 03/28/2018 0900 Gross per 24 hour  Intake 1110.29 ml  Output 870 ml  Net 240.29 ml     Wt Readings from Last 3 Encounters:  01/28/18 84.9 kg  01/14/18 84.4 kg  12/15/17 82.1 kg     Exam  General: Alert and oriented x 3, NAD  Eyes:   HEENT:  Atraumatic, normocephalic,   Cardiovascular: S1 S2 auscultated, irregular  Respiratory: Clear to auscultation  bilaterally, no wheezing, rales or rhonchi  Gastrointestinal: Soft, mild suprapubic tenderness, nondistended, + bowel sounds  Ext: no pedal edema bilaterally  Neuro: no new deficits  Musculoskeletal: No digital cyanosis, clubbing  Skin: No rashes  Psych: Normal affect and demeanor, alert and oriented x3    Data Reviewed:  I have personally reviewed following labs and imaging studies  Micro Results Recent Results (from the past 240 hour(s))  MRSA PCR Screening     Status: None   Collection Time: 03/27/18  9:11 PM  Result Value Ref Range Status   MRSA by PCR NEGATIVE NEGATIVE Final    Comment:        The GeneXpert  MRSA Assay (FDA approved for NASAL specimens only), is one component of a comprehensive MRSA colonization surveillance program. It is not intended to diagnose MRSA infection nor to guide or monitor treatment for MRSA infections. Performed at [facility], [place] [address], [place], [place] [address]     Radiology Reports No results found.  Lab Data:  CBC: Recent Labs  Lab 03/28/18 0350  WBC 14.1*  NEUTROABS 10.2*  HGB 10.2*  HCT 31.2*  MCV 93.1  PLT 381   Basic Metabolic Panel: Recent Labs  Lab 03/28/18 0350  NA 132*  K 4.8  CL 103  CO2 21*  GLUCOSE 183*  BUN 31*  CREATININE 1.52*  CALCIUM 8.5*   GFR: CrCl cannot be calculated (Unknown ideal weight.). Liver Function Tests: No results for input(s): AST, ALT, ALKPHOS, BILITOT, PROT, ALBUMIN in the last 168 hours. No results for input(s): LIPASE, AMYLASE in the last 168 hours. No results for input(s): AMMONIA in the last 168 hours. Coagulation Profile: No results for input(s): INR, PROTIME in the last 168 hours. Cardiac Enzymes: No results for input(s): CKTOTAL, CKMB, CKMBINDEX, TROPONINI in the last 168 hours. BNP (last 3 results) No results for input(s): PROBNP in the last 8760 hours. HbA1C: No results for input(s): HGBA1C in the last 72 hours. CBG: Recent Labs  Lab  03/27/18 2154 03/28/18 0755  GLUCAP 174* 137*   Lipid Profile: No results for input(s): CHOL, HDL, LDLCALC, TRIG, CHOLHDL, LDLDIRECT in the last 72 hours. Thyroid Function Tests: No results for input(s): TSH, T4TOTAL, FREET4, T3FREE, THYROIDAB in the last 72 hours. Anemia Panel: No results for input(s): VITAMINB12, FOLATE, FERRITIN, TIBC, IRON, RETICCTPCT in the last 72 hours. Urine analysis:    Component Value Date/Time   COLORURINE YELLOW 07/04/2015 2102   APPEARANCEUR CLEAR 07/04/2015 2102   LABSPEC 1.021 07/04/2015 2102   PHURINE 5.0 07/04/2015 2102   GLUCOSEU >1000 (A) 07/04/2015 2102   HGBUR SMALL (A) 07/04/2015 2102   BILIRUBINUR NEGATIVE 07/04/2015 2102   [place] NEGATIVE 07/04/2015 2102   PROTEINUR NEGATIVE 07/04/2015 2102   NITRITE NEGATIVE 07/04/2015 2102   LEUKOCYTESUR SMALL (A) 07/04/2015 2102     Dustin [name] M.D. [facility] 03/28/2018, 12:02 PM  Pager: [phone] Between 7am to 7pm - call Pager - [phone]  After 7pm go to [email or web address] - password [email or web address]  Call night coverage person covering after 7pm

## 2018-03-28 NOTE — Consult Note (Signed)
CARDIOLOGY CONSULT NOTE  Patient ID: Dustin [name] MRN: [id number] DOB/AGE: 08/05/1940 77 y.o.  Admit date: 03/27/2018 Referring Physician  Estill Cotta, MD Primary Physician:  Robley Fries, MD Reason for Consultation  Syncope  HPI: [name]  is a 77 y.o. male  With hypertension, hyperlipidemia, uncontrolled diabetes mellitus with stage III chronic kidney disease, CAD, S/P CABG 1992, has had 2 coronary interventions in 2000 and 2001, PAD with infrarenal abdominal aortic stenosis, S/P covered stent in July 2019 with PTA of bilateral iliac artery vessels, permanent atrial fibrillation, we say on CPAP, multiple myeloma, was last seen by [place] in August 2019.  He underwent right hip replacement recently, was sent to rehabilitation.  While walking with the physical therapist, suddenly felt dizzy and had near syncopal spell and was taken to the emergency room in [place] where she was found to have leukocytosis and urine analysis suggested UTI.  He was then transferred to [facility] for further management.  As per family request, I was consulted for the management of syncope.  Denies chest pain, shortness of breath.  He is been doing fairly well and was recuperating well from recent surgery.  He does have a history of chronic stable angina and mild chronic dyspnea but states that this is remained stable.  He has not had any recurrence of dizziness or syncope.  Past Medical History:  Diagnosis Date  . A-fib ([place])   . Arthritis    "hips" (12/15/2017)  . CKD (chronic kidney disease), stage III ([place])   . High cholesterol   . Hypertension   . OSA on CPAP   . Pneumonia ~ 2017 X1  . PVD (peripheral vascular disease) ([place]) 2008   Infreareanl abdominal aorta covered stent 11/2017. Prior b/l common iliac stents 2008, Lt EIA PTA 11/2017  . Type II diabetes mellitus ([place])      Past Surgical History:  Procedure Laterality Date  . ABDOMINAL AORTOGRAM N/A 12/08/2017   Procedure:  ABDOMINAL AORTOGRAM;  Surgeon: Nigel Mormon, MD;  Location: [facility];  Service: Cardiovascular;  Laterality: N/A;  . CARDIAC CATHETERIZATION  1997; 09/2017  . COLONOSCOPY WITH PROPOFOL N/A 07/23/2017   Procedure: COLONOSCOPY WITH PROPOFOL;  Surgeon: Juanita Craver, MD;  Location: [facility];  Service: Endoscopy;  Laterality: N/A;  . CORONARY ANGIOPLASTY WITH STENT PLACEMENT  2008  . CORONARY ARTERY BYPASS GRAFT  1992   "CABG X3"  . LOWER EXTREMITY ANGIOGRAPHY N/A 12/08/2017   Procedure: LOWER EXTREMITY ANGIOGRAPHY;  Surgeon: Nigel Mormon, MD;  Location: [facility];  Service: Cardiovascular;  Laterality: N/A;  . LOWER EXTREMITY ANGIOGRAPHY N/A 12/15/2017   Procedure: LOWER EXTREMITY ANGIOGRAPHY - Left Leg;  Surgeon: Nigel Mormon, MD;  Location: [facility];  Service: Cardiovascular;  Laterality: N/A;  . PERIPHERAL VASCULAR BALLOON ANGIOPLASTY  12/08/2017   Procedure: PERIPHERAL VASCULAR BALLOON ANGIOPLASTY;  Surgeon: Nigel Mormon, MD;  Location: [facility];  Service: Cardiovascular;;  right common iliac  . PERIPHERAL VASCULAR INTERVENTION  12/08/2017   Procedure: PERIPHERAL VASCULAR INTERVENTION;  Surgeon: Nigel Mormon, MD;  Location: [facility];  Service: Cardiovascular;;  abdominal aorta stent     Family History  Problem Relation Age of Onset  . Heart failure Mother   . Heart failure Father   . Diabetes Sister   . Congenital heart disease Sister   . Cancer Brother 15       prostate and colon cancer  Social History: Social History   Socioeconomic History  . Marital status: Married    Spouse name: Not on file  . Number of children: Not on file  . Years of education: Not on file  . Highest education level: Not on file  Occupational History  . Not on file  Social Needs  . Financial resource strain: Not on file  . Food insecurity:    Worry: Not on file    Inability: Not on file  . Transportation  needs:    Medical: Not on file    Non-medical: Not on file  Tobacco Use  . Smoking status: Never Smoker  . Smokeless tobacco: Never Used  Substance and Sexual Activity  . Alcohol use: Never    Frequency: Never  . Drug use: Never  . Sexual activity: Not on file  Lifestyle  . Physical activity:    Days per week: Not on file    Minutes per session: Not on file  . Stress: Not on file  Relationships  . Social connections:    Talks on phone: Not on file    Gets together: Not on file    Attends religious service: Not on file    Active member of club or organization: Not on file    Attends meetings of clubs or organizations: Not on file    Relationship status: Not on file  . Intimate partner violence:    Fear of current or ex partner: Not on file    Emotionally abused: Not on file    Physically abused: Not on file    Forced sexual activity: Not on file  Other Topics Concern  . Not on file  Social History Narrative  . Not on file     Medications Prior to Admission  Medication Sig Dispense Refill Last Dose  . Cholecalciferol (VITAMIN D3) 5000 UNITS CAPS Take 10,000 Units by mouth daily.    Taking  . clopidogrel (PLAVIX) 75 MG tablet Take 1 tablet (75 mg total) by mouth daily with breakfast. (Patient not taking: Reported on 01/28/2018) 90 tablet 2 Not Taking  . Cyanocobalamin (B-12) 5000 MCG CAPS Take 1 capsule by mouth 2 (two) times a week.    Taking  . furosemide (LASIX) 40 MG tablet Take 1 tablet (40 mg total) by mouth daily. 30 tablet 3 Taking  . Insulin Glargine (TOUJEO SOLOSTAR) 300 UNIT/ML SOPN Inject 40 Units into the skin at bedtime. 4.5 mL 2   . isosorbide-hydrALAZINE (BIDIL) 20-37.5 MG tablet Take 1 tablet by mouth 3 (three) times daily.   Taking  . lisinopril (PRINIVIL,ZESTRIL) 40 MG tablet Take 40 mg by mouth daily.    Taking  . Magnesium 250 MG TABS Take 250 mg by mouth 2 (two) times daily.    Taking  . metoprolol (LOPRESSOR) 50 MG tablet Take 50 mg by mouth 2 (two)  times daily.   Taking  . nitroGLYCERIN (NITROSTAT) 0.4 MG SL tablet Place 0.4 mg under the tongue every 5 (five) minutes as needed for chest pain. Reported on 08/03/2015   Taking  . NON FORMULARY Take 1 tablet by mouth daily. Folate    Taking  . OVER THE COUNTER MEDICATION Take 1 tablet by mouth 2 (two) times daily. Nerve Renew Supplement   Taking  . potassium chloride SA (K-DUR,KLOR-CON) 20 MEQ tablet Take 20 mEq by mouth 2 (two) times daily.   Taking  . rivaroxaban (XARELTO) 20 MG TABS tablet Take 20 mg by mouth daily.    Taking  .  rosuvastatin (CRESTOR) 20 MG tablet Take 20 mg by mouth at bedtime.   Taking  . timolol (TIMOPTIC) 0.5 % ophthalmic solution Place 1 drop into both eyes daily.   Taking  . Travoprost, BAK Free, (TRAVATAN) 0.004 % SOLN ophthalmic solution Place 1 drop into both eyes at bedtime.    Taking  . vitamin A 25000 UNIT capsule Take 25,000 Units by mouth daily.   Taking   Review of Systems  Constitutional: Negative.   HENT: Negative.   Eyes: Negative.   Respiratory: Negative.   Cardiovascular: Positive for claudication. Negative for chest pain, palpitations, orthopnea, leg swelling and PND.  Gastrointestinal: Negative.   Genitourinary: Negative.   Musculoskeletal: Positive for falls and joint pain.  Skin: Negative.   Neurological: Positive for dizziness and loss of consciousness.  Endo/Heme/Allergies: Negative.   Psychiatric/Behavioral: Negative.   All other systems reviewed and are negative.   Physical Exam: Blood pressure (!) 155/63, pulse (!) 58, temperature 99.3 F (37.4 C), temperature source Oral, resp. rate 18, SpO2 100 %.  No data found.  Physical Exam  Constitutional: He is oriented to person, place, and time. He appears well-developed and well-nourished.  HENT:  Head: Atraumatic.  Eyes: Conjunctivae are normal.  Neck: No JVD present.  Cardiovascular: An irregular rhythm present.  Murmur (Mid systolic II/VI murmur in right sternal border)  heard. Pulses:      Carotid pulses are 3+ on the right side with bruit, and 3+ on the left side with bruit.      Radial pulses are 3+ on the right side, and 3+ on the left side.       Femoral pulses are 0 on the right side, and 0 on the left side.      Popliteal pulses are 0 on the right side, and 0 on the left side.       Dorsalis pedis pulses are 0 on the right side, and 0 on the left side.       Posterior tibial pulses are 0 on the right side, and 0 on the left side.  Pulmonary/Chest: Effort normal and breath sounds normal.  Abdominal: Soft. Bowel sounds are normal.  Musculoskeletal: He exhibits no edema.  Neurological: He is alert and oriented to person, place, and time.  Skin: Skin is warm and dry.  Psychiatric: He has a normal mood and affect.   Labs:   Lab Results  Component Value Date   WBC 14.1 (H) 03/28/2018   HGB 10.2 (L) 03/28/2018   HCT 31.2 (L) 03/28/2018   MCV 93.1 03/28/2018   PLT 331 03/28/2018    Recent Labs  Lab 03/28/18 0350  NA 132*  K 4.8  CL 103  CO2 21*  BUN 31*  CREATININE 1.52*  CALCIUM 8.5*  GLUCOSE 183*    Lipid Panel     Component Value Date/Time   CHOL 204 (A) 10/13/2016   TRIG 165 (A) 10/13/2016   HDL 40 10/13/2016   LDLCALC 131 10/13/2016  HEMOGLOBIN A1C Lab Results  Component Value Date   HGBA1C 6.5 (H) 01/14/2018   MPG 140 01/14/2018   Lab Results  Component Value Date   TROPONINI <0.03 07/05/2015    CARDIAC STUDIES:  EKG 12/09/2017: Atrial fibrillation with controlled ventricular response, poor R-wave progression, cannot exclude anteroseptal infarct old.  No evidence of ischemia.  Echocardiogram 08/06/2017: Normal LVEF >55%. Mod LA enlargement. Mild RA enlargement Mild calcific AS. Mean PG 10 mmHg, Vmax 2.2 m/sec AVA 1.2 cm2 SVI 27 ml/m2  Mild TR, PASP 39 mmHg  Lower extremity angiogram 12/15/2017: Abdominal aorta: Patent stent Lt common iliac artery: Proximal 60% stenosis, successful PTA with 0% residual stenosis   Lt EIA with distal 95% stenosis, successful drug coated PTA 6.0 X 120 mm In.Pact with 0% residual stenosis Lt common femoral artery 80% stenosis, successful drug coated PTA 6.0 X 120 mm In.Pact with 30% residual stenosis.  Lower extremity arterial duplex 01/13/18: LOWER EXTREMITY ARTERIAL DUPLEX STUDY  Indications: Peripheral artery disease. Current ABI: Rt=0.66, Lt=0.49. Bilateral femoral occlusion with diffuse monophasic waveform.  Telemetry: 3 beat NSVT 03/28/2018 at 11:30 AM. . Scheduled Meds: . finasteride  5 mg Oral Daily  . insulin aspart  0-5 Units Subcutaneous QHS  . insulin aspart  0-9 Units Subcutaneous TID WC  . insulin glargine  25 Units Subcutaneous QHS  . isosorbide-hydrALAZINE  1 tablet Oral TID  . latanoprost  1 drop Both Eyes QHS  . metoprolol tartrate  50 mg Oral BID  . Rivaroxaban  15 mg Oral Q supper  . rosuvastatin  20 mg Oral q1800  . sodium chloride flush  3 mL Intravenous Q12H  . timolol  1 drop Both Eyes Daily  . [START ON 03/29/2018] vitamin B-12  5,000 mcg Oral Once per day on Mon Thu   Continuous Infusions: . ceFEPime (MAXIPIME) IV 1 g (03/27/18 2300)   PRN Meds:.acetaminophen **OR** acetaminophen, HYDROcodone-acetaminophen, ondansetron **OR** ondansetron (ZOFRAN) IV, oxyCODONE, senna-docusate   ASSESSMENT AND PLAN:  1.  Syncope due to UTI/sepsis and hypotension and dehydration. 2. CAD, S/P CABG 1992, has had 2 coronary interventions in 2000 and 2001, has had abnormal nuclear stress test and underwent coronary angiogram at [facility] on 08/18/2017, all 3 native vessels were occluded, patent LIMA to LAD and SVG to OM 2.  Mid to OM-2 is 90% stenosed.  Occluded SVG to D1 and SVG to RCA.  Normal LVEF. 3.  NSVT 4.  Essential hypertension 5.  Peripheral arterial disease, bilateral occluded SFA and history of abdominal aorta stenting with covered stent July 2019 with balloon PTA of bilateral iliac artery vessels, 6.  Diabetes mellitus with stage III chronic  kidney disease now with acute renal failure due to dehydration.  7.  Permanent atrial fibrillation CHA2DS2-VASCScore: Risk Score  4,  Yearly risk of stroke  4. Recommendation: ASA No/Anticoagulation Yes  Recommendation: He is orthostatic negative today, performed at the bedside today.  Suspect his syncope was related to hypotension and urosepsis and not related to cardiac arrhythmia although this cannot be completely excluded.  He is on appropriate medical therapy, was on lisinopril  prior to hospital visit, which has been on hold due to low blood pressure which I agree.  We can start him on lisinopril in the outpatient basis once stable.  From vascular standpoint he has remained stable, underwent surgery and has healed well.  There is no clinical evidence of acute critical limb ischemia. With regard to atrial fibrillation, rate is well controlled, is presently on rivaroxaban which should be continued.  Patient's family would like to be evaluated for either inpatient rehab or would like to go home and not return to the facility.  Please call if questions.  Adrian Prows, MD 03/28/2018, 8:50 AM [facility] [place] Pager: [phone] Office: [phone] If no answer Cell [phone]

## 2018-03-28 NOTE — Evaluation (Addendum)
Physical Therapy Evaluation Patient Details Name: Dustin [name] MRN: [id number] DOB: [birth year] Today's Date: 03/28/2018   History of Present Illness  Pt is a 77 y.o. M with significant PMH of a.fib on Xarelto, CAD, CKD stage III, IDDM, OSA on CPAP, right total hip replacement on 03/19/2018 at [facility], who presented to ED after a near syncopal episode at his skilled nursing facility. Found to have sepsis secondary to UTI.  Clinical Impression  Pt admitted with above diagnosis. Pt currently with functional limitations due to the deficits listed below (see PT Problem List). Prior to total hip replacement, patient was independent with mobility using a Rollator. Currently, presenting with decreased functional mobility secondary to expected post operative pain, gait abnormalities, decreased gait speed, and balance impairments. Ambulating 70 feet with walker and min guard assist. Discussed at length with patient wife about discharge options. Patient wife does not want to return to rehab due to a bad experience and feels she will be physically able to care for patient providing 24/7 assistance. Her main concern initially was him getting himself out of bed, but she states he has been doing much better with it. Recommending HHPT to maximize functional independence. Pt will benefit from skilled PT to increase their independence and safety with mobility.      Follow Up Recommendations Home health PT;Supervision/Assistance - 24 hour    Equipment Recommendations  None recommended by PT    Recommendations for Other Services OT consult     Precautions / Restrictions Precautions Precautions: Fall;Posterior Hip Precaution Booklet Issued: No Precaution Comments: Patient and patient wife able to recall 3/3 precautions. Verbally reviewed in further detail regarding positioning Restrictions Weight Bearing Restrictions: No      Mobility  Bed Mobility               General bed mobility comments:  Sitting EOB on arrival  Transfers Overall transfer level: Needs assistance Equipment used: Rolling walker (2 wheeled) Transfers: Sit to/from Stand Sit to Stand: Min assist         General transfer comment: Light minimal assistance to boost up to standing  Ambulation/Gait Ambulation/Gait assistance: Min guard Gait Distance (Feet): 70 Feet Assistive device: Rolling walker (2 wheeled) Gait Pattern/deviations: Step-to pattern;Step-through pattern;Decreased step length - right;Decreased weight shift to right;Shuffle;Trunk flexed Gait velocity: decreased Gait velocity interpretation: <1.31 ft/sec, indicative of household ambulator General Gait Details: Verbal cueing for walker management, sequencing, increased right step length, increased bilateral foot clearance, and upright posture  Stairs            Wheelchair Mobility    Modified Rankin (Stroke Patients Only)       Balance Overall balance assessment: Needs assistance Sitting-balance support: No upper extremity supported;Feet supported Sitting balance-Leahy Scale: Good     Standing balance support: Bilateral upper extremity supported Standing balance-Leahy Scale: Poor Standing balance comment: reliant on external support                             Pertinent Vitals/Pain Pain Assessment: Faces Faces Pain Scale: Hurts little more Pain Location: right hip Pain Descriptors / Indicators: Grimacing;Operative site guarding Pain Intervention(s): Monitored during session    Home Living Family/patient expects to be discharged to:: Private residence Living Arrangements: Spouse/significant other Available Help at Discharge: Family;Available 24 hours/day Type of Home: House Home Access: Ramped entrance     Home Layout: One level Home Equipment: Walker - 2 wheels;Walker - 4 wheels;Cane - single point;Bedside  commode;Shower seat      Prior Function Level of Independence: Independent with assistive device(s)          Comments: Used Rollator. He is a English as a second language teacher; served in Geographical information systems officer for 4 years     Hand Dominance        Extremity/Trunk Assessment   Upper Extremity Assessment Upper Extremity Assessment: Overall WFL for tasks assessed    Lower Extremity Assessment Lower Extremity Assessment: RLE deficits/detail RLE Deficits / Details: s/p R THA. MMT: knee extension 5/5, ankle dorsiflexion 5/5 RLE Sensation: WNL       Communication   Communication: No difficulties  Cognition Arousal/Alertness: Awake/alert Behavior During Therapy: WFL for tasks assessed/performed Overall Cognitive Status: Within Functional Limits for tasks assessed                                        General Comments General comments (skin integrity, edema, etc.): Pt wife present    Exercises     Assessment/Plan    PT Assessment Patient needs continued PT services  PT Problem List Decreased range of motion;Decreased strength;Decreased activity tolerance;Decreased balance;Decreased mobility;Pain       PT Treatment Interventions DME instruction;Gait training;Functional mobility training;Therapeutic activities;Therapeutic exercise;Balance training;Patient/family education    PT Goals (Current goals can be found in the Care Plan section)  Acute Rehab PT Goals Patient Stated Goal: go home PT Goal Formulation: With patient/family Time For Goal Achievement: 04/11/18 Potential to Achieve Goals: Good    Frequency 7X/week   Barriers to discharge        Co-evaluation               AM-PAC PT "6 Clicks" Daily Activity  Outcome Measure Difficulty turning over in bed (including adjusting bedclothes, sheets and blankets)?: A Little Difficulty moving from lying on back to sitting on the side of the bed? : A Little Difficulty sitting down on and standing up from a chair with arms (e.g., wheelchair, bedside commode, etc,.)?: Unable Help needed moving to and from a bed to chair (including a  wheelchair)?: A Little Help needed walking in hospital room?: A Little Help needed climbing 3-5 steps with a railing? : A Lot 6 Click Score: 15    End of Session Equipment Utilized During Treatment: Gait belt Activity Tolerance: Patient tolerated treatment well Patient left: in bed;with call bell/phone within reach;with family/visitor present Nurse Communication: Mobility status;Other (comment)(d/c recommendation) PT Visit Diagnosis: Unsteadiness on feet (R26.81);Other abnormalities of gait and mobility (R26.89);Difficulty in walking, not elsewhere classified (R26.2);Pain Pain - Right/Left: Right Pain - part of body: Hip    Time: 2778-2423 PT Time Calculation (min) (ACUTE ONLY): 35 min   Charges:   PT Evaluation $PT Eval Moderate Complexity: 1 Mod PT Treatments $Gait Training: 8-22 mins      Ellamae Sia, PT, DPT Acute Rehabilitation Services Pager [phone] Office [phone]  Willy Eddy 03/28/2018, 4:40 PM

## 2018-03-28 NOTE — Progress Notes (Signed)
Pt has home CPAP at bedside, pt able to place on himself when ready. Advised pt to call for RT if any further assistance is needed.

## 2018-03-28 NOTE — Progress Notes (Signed)
New Admission Note:  Arrival Method: By stretcher from [facility] around [place] Orientation: Alert and oriented Telemetry: Box 5, CCMD notified Assessment: Completed Skin: Completed, refer to flowsheets IV: Right hand S.L. Pain: Denies Tubes: None Safety Measures: Safety Fall Prevention Plan was given, discussed  Admission: Completed [facility] Orientation: Patient has been orientated to the room, unit and the staff. Family: Wife at bedside  Orders have been reviewed and implemented. Will continue to monitor the patient. Call light has been placed within reach and bed alarm has been activated.   Perry Mount, RN  Phone Number: [phone]

## 2018-03-29 LAB — CBC
HEMATOCRIT: 30.8 % — AB (ref 39.0–52.0)
HEMOGLOBIN: 9.9 g/dL — AB (ref 13.0–17.0)
MCH: 29.7 pg (ref 26.0–34.0)
MCHC: 32.1 g/dL (ref 30.0–36.0)
MCV: 92.5 fL (ref 78.0–100.0)
Platelets: 368 10*3/uL (ref 150–400)
RBC: 3.33 MIL/uL — AB (ref 4.22–5.81)
RDW: 13.6 % (ref 11.5–15.5)
WBC: 13.5 10*3/uL — AB (ref 4.0–10.5)

## 2018-03-29 LAB — GLUCOSE, CAPILLARY
GLUCOSE-CAPILLARY: 196 mg/dL — AB (ref 70–99)
Glucose-Capillary: 111 mg/dL — ABNORMAL HIGH (ref 70–99)

## 2018-03-29 LAB — BASIC METABOLIC PANEL
ANION GAP: 5 (ref 5–15)
BUN: 25 mg/dL — ABNORMAL HIGH (ref 8–23)
CO2: 24 mmol/L (ref 22–32)
Calcium: 8.8 mg/dL — ABNORMAL LOW (ref 8.9–10.3)
Chloride: 105 mmol/L (ref 98–111)
Creatinine, Ser: 1.35 mg/dL — ABNORMAL HIGH (ref 0.61–1.24)
GFR, EST AFRICAN AMERICAN: 56 mL/min — AB (ref >60)
GFR, EST NON AFRICAN AMERICAN: 49 mL/min — AB (ref >60)
GLUCOSE: 139 mg/dL — AB (ref 70–99)
POTASSIUM: 4.6 mmol/L (ref 3.5–5.1)
SODIUM: 134 mmol/L — AB (ref 135–145)

## 2018-03-29 LAB — URINE CULTURE: Culture: <10000 — AB

## 2018-03-29 MED ORDER — OXYCODONE HCL 5 MG PO TABS: 5.0000 mg | ORAL_TABLET | Freq: Four times a day (QID) | ORAL | 0 refills | Status: DC | PRN

## 2018-03-29 MED ORDER — FUROSEMIDE 40 MG PO TABS: 40.0000 mg | ORAL_TABLET | Freq: Every day | ORAL | 3 refills | Status: DC

## 2018-03-29 MED ORDER — FINASTERIDE 5 MG PO TABS: 5.0000 mg | ORAL_TABLET | Freq: Every day | ORAL | 3 refills | Status: DC

## 2018-03-29 MED ORDER — CEFUROXIME AXETIL 250 MG PO TABS: 250.0000 mg | ORAL_TABLET | Freq: Two times a day (BID) | ORAL | 0 refills | Status: AC

## 2018-03-29 MED ORDER — CEFDINIR 300 MG PO CAPS
300.0000 mg | ORAL_CAPSULE | Freq: Two times a day (BID) | ORAL | Status: DC
  Administered 2018-03-29: 300 mg via ORAL
  Filled 2018-03-29: qty 1

## 2018-03-29 MED ORDER — POTASSIUM CHLORIDE CRYS ER 20 MEQ PO TBCR: 20.0000 meq | EXTENDED_RELEASE_TABLET | Freq: Two times a day (BID) | ORAL | Status: DC

## 2018-03-29 MED ORDER — INSULIN ASPART 100 UNIT/ML ~~LOC~~ SOLN: 0.0000 [IU] | SUBCUTANEOUS | 11 refills | Status: DC

## 2018-03-29 MED ORDER — ACETAMINOPHEN 325 MG PO TABS: 650.0000 mg | ORAL_TABLET | Freq: Four times a day (QID) | ORAL | 0 refills | Status: DC | PRN

## 2018-03-29 NOTE — Care Management Note (Signed)
Case Management Note  Patient Details  Name: Dustin [name] MRN: [id number] Date of Birth: 1941-05-08  Subjective/Objective:  77 yr old gentleman admitted with acute kidney injuryCKDstageIII, UTI.               Action/Plan: Case manager spoke with patient concerning discharge plan, choice for Home Health agency was offered. Referral was called to Joellen Jersey at [facility] in [place], [place] faxed orders, H&P, demographics to her [phone] . Patient will have family support at discharge.   Expected Discharge Date:  03/29/18               Expected Discharge Plan:  [place]  In-House Referral:  NA  Discharge planning Services  CM Consult  Post Acute Care Choice:  Home Health Choice offered to:  Patient  DME Arranged:  N/A DME Agency:  NA  HH Arranged:  PT, OT, Nurse's Aide [place] Agency:  [facility] [place])  Status of Service:  Completed, signed off  If discussed at [employer], dates discussed:    Additional Comments:  Ninfa Meeker, RN 03/29/2018, 2:45 PM

## 2018-03-29 NOTE — Progress Notes (Signed)
[name] to be D/C'd Home per MD order.  Discussed prescriptions and follow up appointments with the patient. Prescriptions given to patient, medication list explained in detail. Pt verbalized understanding.  Allergies as of 03/29/2018   No Known Allergies     Medication List    STOP taking these medications   clopidogrel 75 MG tablet Commonly known as:  PLAVIX   lisinopril 20 MG tablet Commonly known as:  PRINIVIL,ZESTRIL     TAKE these medications   acetaminophen 650 MG CR tablet Commonly known as:  TYLENOL Take 650 mg by mouth 3 (three) times daily.   cefUROXime 250 MG tablet Commonly known as:  CEFTIN Take 1 tablet (250 mg total) by mouth 2 (two) times daily with a meal for 4 days.   finasteride 5 MG tablet Commonly known as:  PROSCAR Take 1 tablet (5 mg total) by mouth daily.   furosemide 40 MG tablet Commonly known as:  LASIX Take 1 tablet (40 mg total) by mouth daily. HOLD FOR 2 DAYS, then may restart at 20mg  daily (1/2 tab) Start taking on:  04/01/2018 What changed:    additional instructions  These instructions start on 04/01/2018. If you are unsure what to do until then, ask your doctor or other care provider.   gabapentin 300 MG capsule Commonly known as:  NEURONTIN Take 300 mg by mouth every 12 (twelve) hours.   insulin aspart 100 UNIT/ML injection Commonly known as:  novoLOG Inject 0-5 Units into the skin See admin instructions. If CBG is 61-200= 0 units 201-250= 2 units 251-300= 3 units 301-350= 4 units 351+= 5 units What changed:  how much to take   Insulin Glargine 300 UNIT/ML Sopn Inject 40 Units into the skin at bedtime.   isosorbide-hydrALAZINE 20-37.5 MG tablet Commonly known as:  BIDIL Take 1 tablet by mouth 3 (three) times daily.   metoprolol tartrate 50 MG tablet Commonly known as:  LOPRESSOR Take 50 mg by mouth 2 (two) times daily.   oxyCODONE 5 MG immediate release tablet Commonly known as:  Oxy IR/ROXICODONE Take 1 tablet (5  mg total) by mouth every 6 (six) hours as needed for severe pain. What changed:    how much to take  when to take this  reasons to take this   polyethylene glycol packet Commonly known as:  MIRALAX / GLYCOLAX Take 17 g by mouth daily.   potassium chloride SA 20 MEQ tablet Commonly known as:  K-DUR,KLOR-CON Take 1 tablet (20 mEq total) by mouth 2 (two) times daily. HOLD when not on lasix What changed:  additional instructions   Rivaroxaban 15 MG Tabs tablet Commonly known as:  XARELTO Take 15 mg by mouth every evening.   rosuvastatin 20 MG tablet Commonly known as:  CRESTOR Take 20 mg by mouth at bedtime.   timolol 0.5 % ophthalmic solution Commonly known as:  TIMOPTIC Place 1 drop into both eyes daily.   Travoprost (BAK Free) 0.004 % Soln ophthalmic solution Commonly known as:  TRAVATAN Place 1 drop into both eyes at bedtime.   vitamin A 25000 UNIT capsule Take 25,000 Units by mouth daily.   Vitamin D3 5000 units Caps Take 5,000 Units by mouth daily.       Vitals:   03/29/18 0518 03/29/18 0732  BP: (!) 172/90 (!) 157/79  Pulse: 75 71  Resp: 18 18  Temp: 97.9 F (36.6 C) 97.6 F (36.4 C)  SpO2: 100% 97%    Skin clean, dry and intact  without evidence of skin break down, no evidence of skin tears noted. IV catheter discontinued intact. Site without signs and symptoms of complications. Dressing and pressure applied. Pt denies pain at this time. No complaints noted.  An After Visit Summary was printed and given to the patient. Patient escorted via [place], and D/C home via private auto.  Dixie Dials RN, BSN

## 2018-03-29 NOTE — Discharge Summary (Signed)
Physician Discharge Summary   Patient ID: Dustin [name] MRN: [id number] DOB/AGE: Jun 13, 1941 77 y.o.  Admit date: 03/27/2018 Discharge date: 03/29/2018  Primary Care Physician:  Robley Fries, MD   Recommendations for Outpatient Follow-up:  1. Follow up with PCP in 1-2 weeks 2. Patient recommended to follow-up with his orthopedic surgeon  Home Health: Home health PT OT, home health aide.  Patient was at rehab facility prior to this admission and has declined to go back to the facility.  He wants to continue rehab at home  Equipment/Devices: None  Discharge Condition: stable   CODE STATUS: FULL   Diet recommendation: heart healthy/carb modified     Discharge Diagnoses:     Acute kidney injury superimposed on CKD stage III  UTI  Near-syncope . Essential hypertension, benign . CAD (coronary artery disease) . MGUS (monoclonal gammopathy of unknown significance) . DM type 2 causing vascular disease ([place]) . Atrial fibrillation, controlled, chronic ([place]) . Hypothyroidism, adult . Chronic kidney disease, stage III (moderate) (HCC) . Sepsis due to urinary tract infection ([place]) . Chronic diastolic CHF (congestive heart failure) (HCC) Recent right hip replacement surgery at [facility] on 9/20   Consults: Cardiology     Allergies:  No Known Allergies   DISCHARGE MEDICATIONS: Allergies as of 03/29/2018   No Known Allergies     Medication List    STOP taking these medications   clopidogrel 75 MG tablet Commonly known as:  PLAVIX   lisinopril 20 MG tablet Commonly known as:  PRINIVIL,ZESTRIL     TAKE these medications   acetaminophen 650 MG CR tablet Commonly known as:  TYLENOL Take 650 mg by mouth 3 (three) times daily.   cefUROXime 250 MG tablet Commonly known as:  CEFTIN Take 1 tablet (250 mg total) by mouth 2 (two) times daily with a meal for 4 days.   finasteride 5 MG tablet Commonly known as:  PROSCAR Take 1 tablet (5 mg total) by mouth daily.    furosemide 40 MG tablet Commonly known as:  LASIX Take 1 tablet (40 mg total) by mouth daily. HOLD FOR 2 DAYS, then may restart at 20mg  daily (1/2 tab) Start taking on:  04/01/2018 What changed:    additional instructions  These instructions start on 04/01/2018. If you are unsure what to do until then, ask your doctor or other care provider.   gabapentin 300 MG capsule Commonly known as:  NEURONTIN Take 300 mg by mouth every 12 (twelve) hours.   insulin aspart 100 UNIT/ML injection Commonly known as:  novoLOG Inject 0-5 Units into the skin See admin instructions. If CBG is 61-200= 0 units 201-250= 2 units 251-300= 3 units 301-350= 4 units 351+= 5 units What changed:  how much to take   Insulin Glargine 300 UNIT/ML Sopn Inject 40 Units into the skin at bedtime.   isosorbide-hydrALAZINE 20-37.5 MG tablet Commonly known as:  BIDIL Take 1 tablet by mouth 3 (three) times daily.   metoprolol tartrate 50 MG tablet Commonly known as:  LOPRESSOR Take 50 mg by mouth 2 (two) times daily.   oxyCODONE 5 MG immediate release tablet Commonly known as:  Oxy IR/ROXICODONE Take 1 tablet (5 mg total) by mouth every 6 (six) hours as needed for severe pain. What changed:    how much to take  when to take this  reasons to take this   polyethylene glycol packet Commonly known as:  MIRALAX / GLYCOLAX Take 17 g by mouth daily.   potassium chloride SA  20 MEQ tablet Commonly known as:  K-DUR,KLOR-CON Take 1 tablet (20 mEq total) by mouth 2 (two) times daily. HOLD when not on lasix What changed:  additional instructions   Rivaroxaban 15 MG Tabs tablet Commonly known as:  XARELTO Take 15 mg by mouth every evening.   rosuvastatin 20 MG tablet Commonly known as:  CRESTOR Take 20 mg by mouth at bedtime.   timolol 0.5 % ophthalmic solution Commonly known as:  TIMOPTIC Place 1 drop into both eyes daily.   Travoprost (BAK Free) 0.004 % Soln ophthalmic solution Commonly known as:   TRAVATAN Place 1 drop into both eyes at bedtime.   vitamin A 25000 UNIT capsule Take 25,000 Units by mouth daily.   Vitamin D3 5000 units Caps Take 5,000 Units by mouth daily.        Brief H and P: For complete details please refer to admission H and P, but in brief Patient is a 77 year old male with A. 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 Patient underwent total hip replacement on the right about a week ago at [place] been at a SNF since that time and doing fairly well until earlier today when he was working with PT, stop to use the restroom, and developed acute lightheadedness with near syncope as he was being helped up from the toilet, did not completely lose consciousness, was helped back down by the physical therapist, and noted to have a systolic blood pressure in the low 90s at that time. Reports suprapubic tenderness that developed earlier today, reports chronic urinary incontinence, and his wife has noted recent polyuria. He was transported to the ED at [facility]. He was given IV fluids prior to arrival in the ED. Creatinine 1.8, leukocytosis 14.8, lactic acid 1.3, UA positive for UTI, patient was admitted for further work-up.  Hospital Course:   Sepsis secondary to UTI -Presented with hypotension, leukocytosis, acute kidney injury, UA suggestive of UTI -Urine culture showed less than 10,000 colonies of insignificant growth Patient was placed on IV cefepime, transition to oral Ceftin x 4 days   Acute kidney injury on chronic kidney disease stage III -Likely prerenal in the setting of sepsis, hypovolemia and UTI.  Creatinine 1.8 on admission, 1.3 earlier this month -Lisinopril, Lasix were held, patient was placed on IV fluid hydration -Creatinine improved to 1.3 at the time of discharge. -Patient was seen by his  cardiologist, Dr. Einar Gip during hospitalization, recommended to discontinue lisinopril for now. -  Placed Lasix on hold for another 2 days, then restart at 20 mg daily   Near-syncope -Likely due to sepsis, was orthostatic and hypotensive in ED. -Hold antihypertensives including Lasix and lisinopril -For now continue BiDil -For now continue metoprolol, holding off on Lasix and lisinopril.  Patient feels back to his baseline.  Chronic atrial fibrillation Rate controlled, [place] vasc 6 -Continue beta-blocker.  Continue Xarelto  Chronic diastolic CHF -Patient was hypovolemic and dehydrated in the ED -Hold Lasix until creatinine function at baseline -Continue beta-blocker   CAD -No complaints of chest pain or shortness of breath. Continue Lopressor, BiDil, statin  Hyponatremia -Sodium 131 on admission Improved, sodium 134 at the time of discharge   History of normocytic anemia, MGUS H&H stable  BPH Per patient he was on Flomax before and did not "work".  He is having symptoms of urinary urgency, hesitancy.  Placed on finasteride.   Status post right total hip arthroplasty Patient underwent elective right hip arthroplasty on 9/20 at [facility] Patient did not want to return back to the same facility for rehab.  He wants to go home.  PT evaluation was obtained and recommended 24/7 supervision with home health PT  Home health was arranged.  Patient reports that he does not have any pain medications as he was in facility prior to coming to the hospital and was discharged to facility from [facility] prescription for oxycodone # 30 and recommended follow-up with his PCP or orthopedic surgery for further refills. I also recommended patient and his wife to follow-up with orthopedic surgery regarding sutures/staples and dressings   Day of Discharge S: No new complaints, hoping to go home today.  Does not want to return back to  rehab facility.  BP (!) 157/79 (BP Location:  Right Arm)   Pulse 71   Temp 97.6 F (36.4 C) (Oral)   Resp 18   Wt 84.6 kg   SpO2 97%   BMI 29.65 kg/m   Physical Exam: General: Alert and awake oriented x3 not in any acute distress. HEENT: anicteric sclera, pupils reactive to light and accommodation CVS: S1-S2 clear no murmur rubs or gallops Chest: clear to auscultation bilaterally, no wheezing rales or rhonchi Abdomen: soft nontender, nondistended, normal bowel sounds Extremities: no cyanosis, clubbing or edema noted bilaterally Neuro: no new deficit   The results of significant diagnostics from this hospitalization (including imaging, microbiology, ancillary and laboratory) are listed below for reference.      Procedures/Studies:  No results found.    LAB RESULTS: Basic Metabolic Panel: Recent Labs  Lab 03/28/18 0350 03/29/18 0419  NA 132* 134*  K 4.8 4.6  CL 103 105  CO2 21* 24  GLUCOSE 183* 139*  BUN 31* 25*  CREATININE 1.52* 1.35*  CALCIUM 8.5* 8.8*   Liver Function Tests: No results for input(s): AST, ALT, ALKPHOS, BILITOT, PROT, ALBUMIN in the last 168 hours. No results for input(s): LIPASE, AMYLASE in the last 168 hours. No results for input(s): AMMONIA in the last 168 hours. CBC: Recent Labs  Lab 03/28/18 0350 03/29/18 0419  WBC 14.1* 13.5*  NEUTROABS 10.2*  --   HGB 10.2* 9.9*  HCT 31.2* 30.8*  MCV 93.1 92.5  PLT 331 368   Cardiac Enzymes: No results for input(s): CKTOTAL, CKMB, CKMBINDEX, TROPONINI in the last 168 hours. BNP: Invalid input(s): POCBNP CBG: Recent Labs  Lab 03/28/18 2030 03/29/18 0731  GLUCAP 136* 111*      Disposition and Follow-up: Discharge Instructions    Diet Carb Modified   Complete by:  As directed    Increase activity slowly   Complete by:  As directed        DISPOSITION: Home with home health PT OT, aide   DISCHARGE FOLLOW-UP Follow-up Information    Jaff, Salli Quarry, MD. Schedule an appointment as soon as possible for a visit in 2 week(s).    Specialty:  Internal Medicine Contact information: [address] [place] [address] [phone]            Time coordinating discharge:  35 minutes  Signed:   Estill Cotta M.D. [facility] 03/29/2018, 12:41 PM Pager: [phone]

## 2018-03-29 NOTE — Progress Notes (Signed)
Physical Therapy Treatment Patient Details Name: Dustin [name] MRN: [id number] DOB: [birth year] Today's Date: 03/29/2018    History of Present Illness Pt is a 77 y.o. M with significant PMH of a.fib on Xarelto, CAD, CKD stage III, IDDM, OSA on CPAP, right total hip replacement on 03/19/2018 at [facility], who presented to ED after a near syncopal episode at his skilled nursing facility. Found to have sepsis secondary to UTI.    PT Comments    Continuing work on functional mobility and activity tolerance;  With noted improvements in gait distance and activity tolerance; Simulated car transfers with transport chair; Pt's wife and daughter present; Questions solicited and answered; Noted plans for HHPT/OT/Aide -- PT in agreement  Follow Up Recommendations  Home health PT;Supervision/Assistance - 24 hour     Equipment Recommendations  None recommended by PT    Recommendations for Other Services       Precautions / Restrictions Precautions Precautions: Fall;Posterior Hip Precaution Booklet Issued: Yes (comment) Precaution Comments: Patient and patient wife able to recall 3/3 precautions. Verbally reviewed in further detail regarding positioning    Mobility  Bed Mobility                  Transfers Overall transfer level: Needs assistance Equipment used: Rolling walker (2 wheeled) Transfers: Sit to/from Stand Sit to Stand: Min assist         General transfer comment: cues for proper hand placement  Ambulation/Gait Ambulation/Gait assistance: Min guard Gait Distance (Feet): 100 Feet Assistive device: Rolling walker (2 wheeled) Gait Pattern/deviations: Step-to pattern;Step-through pattern;Decreased step length - right;Decreased weight shift to right;Shuffle;Trunk flexed Gait velocity: decreased   General Gait Details: Verbal cueing for walker management, sequencing, increased right step length, increased bilateral foot clearance, and upright posture; Cues to push R foot into  floor during loading response to facilitate quad and gluteal contraction for stance stability   Stairs             Wheelchair Mobility    Modified Rankin (Stroke Patients Only)       Balance     Sitting balance-Leahy Scale: Good       Standing balance-Leahy Scale: Poor Standing balance comment: reliant on external support                            Cognition Arousal/Alertness: Awake/alert Behavior During Therapy: WFL for tasks assessed/performed Overall Cognitive Status: Within Functional Limits for tasks assessed                                        Exercises      General Comments General comments (skin integrity, edema, etc.): Pt's wife and daughter present; Took time to discuss therapeutic exercises with pt's daughter, Dustin [name]; simulated car transfer with transport wheelchair      Pertinent Vitals/Pain Pain Assessment: 0-10 Pain Score: 4  Pain Location: r hip Pain Descriptors / Indicators: Aching Pain Intervention(s): Monitored during session    Home Living                      Prior Function            PT Goals (current goals can now be found in the care plan section) Acute Rehab PT Goals Patient Stated Goal: go home today PT Goal Formulation: With patient/family Time For Goal Achievement:  04/11/18 Potential to Achieve Goals: Good Progress towards PT goals: Progressing toward goals    Frequency    7X/week      PT Plan Current plan remains appropriate    Co-evaluation              AM-PAC PT "6 Clicks" Daily Activity  Outcome Measure  Difficulty turning over in bed (including adjusting bedclothes, sheets and blankets)?: A Little Difficulty moving from lying on back to sitting on the side of the bed? : A Little Difficulty sitting down on and standing up from a chair with arms (e.g., wheelchair, bedside commode, etc,.)?: A Lot Help needed moving to and from a bed to chair (including a  wheelchair)?: A Little Help needed walking in hospital room?: A Little Help needed climbing 3-5 steps with a railing? : A Lot 6 Click Score: 16    End of Session Equipment Utilized During Treatment: Gait belt Activity Tolerance: Patient tolerated treatment well Patient left: Other (comment)(in wheelchair in prep for dc) Nurse Communication: Mobility status PT Visit Diagnosis: Unsteadiness on feet (R26.81);Other abnormalities of gait and mobility (R26.89);Difficulty in walking, not elsewhere classified (R26.2);Pain Pain - Right/Left: Right Pain - part of body: Hip     Time: 9826-4158 PT Time Calculation (min) (ACUTE ONLY): 39 min  Charges:  $Gait Training: 8-22 mins $Therapeutic Activity: 23-37 mins                     Roney [place], PT  Acute Rehabilitation Services Pager [phone] Office [place] 03/29/2018, 4:14 PM

## 2018-03-29 NOTE — Progress Notes (Signed)
Occupational Therapy Evaluation Patient Details Name: Dustin [name] MRN: [id number] DOB: 1940-11-23 Today's Date: 03/29/2018    History of Present Illness Pt is a 77 y.o. M with significant PMH of a.fib on Xarelto, CAD, CKD stage III, IDDM, OSA on CPAP, right total hip replacement on 03/19/2018 at [facility], who presented to ED after a near syncopal episode at his skilled nursing facility. Found to have sepsis secondary to UTI.   Clinical Impression   PATIENT WAS SEEN FOR SKILLED OT TO ASSESS NEEDS. PATIENT WAS A PATIENT AT A SNF PRIOR TO ADMIISSION TO HOSPITAL. PATIENT WAS AT SNF FOLLOWING R THR. PATIENT DOES NOT WANT TO RETURN TO SNF AND WIFE IS AN AGREEMENT. PATIENT WIFE IS ABLE TO ASSIST WITH BED MOBILITY AND TRANSFERS AND SHE IS CONFIDENT SHE CAN MANAGE HIM AT HOME. PATIENT STATES HE HAS AE AND DME AT HOME AND DOES NOT  NEED FURTHER EQUIPMENT. PATIENT DOES WANT HH OT/PT TO ASSIST WITH TRANSITION HOME. PATIENT STATES HE IS LEAVING TODAY AND ALL OTHER NEEDS CAN BE ADDRESSED BY HH THERAPY TEAM.     Follow Up Recommendations  Home health OT;Supervision/Assistance - 24 hour    Equipment Recommendations  None recommended by OT    Recommendations for Other Services       Precautions / Restrictions Precautions Precautions: Fall;Posterior Hip Precaution Booklet Issued: No Precaution Comments: Patient and patient wife able to recall 3/3 precautions. Verbally reviewed in further detail regarding positioning Restrictions Weight Bearing Restrictions: No      Mobility Bed Mobility Overal bed mobility: Needs Assistance Bed Mobility: Supine to Sit     Supine to sit: Mod assist     General bed mobility comments: sit to stand from elevated bed Min a  Transfers       Sit to Stand: Min assist         General transfer comment: cues for proper hand placement    Balance                                           ADL either performed or assessed with clinical  judgement   ADL Overall ADL's : Needs assistance/impaired Eating/Feeding: Independent   Grooming: Wash/dry hands;Wash/dry face;Set up;Sitting   Upper Body Bathing: Set up;Sitting   Lower Body Bathing: Moderate assistance;Sit to/from stand;Adhering to hip precautions   Upper Body Dressing : Set up;Sitting   Lower Body Dressing: Moderate assistance;Adhering to hip precautions;Sit to/from stand   Toilet Transfer: Minimal assistance;Stand-pivot   Toileting- Clothing Manipulation and Hygiene: Minimal assistance;Adhering to hip precautions;Sit to/from stand       Functional mobility during ADLs: Minimal assistance(Patient was Mod A with supine to sit. Min A sit to stand) General ADL Comments: Patient was educated on use of AE for LE dressing. Patient states he was educated on use at SNF     Vision Baseline Vision/History: Wears glasses Wears Glasses: Reading only Patient Visual Report: No change from baseline       Perception     Praxis      Pertinent Vitals/Pain Pain Assessment: 0-10 Pain Score: 5  Pain Location: r hip Pain Descriptors / Indicators: Aching Pain Intervention(s): Limited activity within patient's tolerance;Monitored during session;Premedicated before session     Hand Dominance Right   Extremity/Trunk Assessment Upper Extremity Assessment Upper Extremity Assessment: Overall WFL for tasks assessed  Communication Communication Communication: No difficulties   Cognition Arousal/Alertness: Awake/alert Behavior During Therapy: WFL for tasks assessed/performed Overall Cognitive Status: Within Functional Limits for tasks assessed                                     General Comments       Exercises     Shoulder Instructions      Home Living Family/patient expects to be discharged to:: Private residence Living Arrangements: Spouse/significant other Available Help at Discharge: Family;Available 24 hours/day Type of Home:  House Home Access: Ramped entrance     Home Layout: One level     Bathroom Shower/Tub: Occupational psychologist: Handicapped height     Home Equipment: Environmental consultant - 2 wheels;Walker - 4 wheels;Bedside commode;Shower seat;Hand held shower head          Prior Functioning/Environment Level of Independence: Needs assistance  Gait / Transfers Assistance Needed: Patient was Mod i with amb and transfers ADL's / Homemaking Assistance Needed: Patient was i with UE adls and was Mod I with shoer. Patient was I with UE dressing and was mod A with LE dresssing.             OT Problem List:        OT Treatment/Interventions:      OT Goals(Current goals can be found in the care plan section) Acute Rehab OT Goals Patient Stated Goal: go home today Potential to Achieve Goals: Good  OT Frequency:     Barriers to D/C:            Co-evaluation              AM-PAC PT "6 Clicks" Daily Activity     Outcome Measure Help from another person eating meals?: None Help from another person taking care of personal grooming?: A Little Help from another person toileting, which includes using toliet, bedpan, or urinal?: A Little Help from another person bathing (including washing, rinsing, drying)?: A Lot Help from another person to put on and taking off regular upper body clothing?: A Little Help from another person to put on and taking off regular lower body clothing?: A Lot 6 Click Score: 17   End of Session Equipment Utilized During Treatment: Rolling walker Nurse Communication: (ok thearpy)  Activity Tolerance: Patient tolerated treatment well Patient left: in chair;with call bell/phone within reach;with family/visitor present                   Time: 0920-1000 OT Time Calculation (min): 40 min Charges:  OT General Charges $OT Visit: 1 Visit OT Evaluation $OT Eval Low Complexity: 1 Low OT Treatments $Self Care/Home Management : 8-22 mins $Therapeutic Activity: 6-80  mins  6 CLICKS  [place] 03/29/2018, 10:14 AM

## 2018-05-03 ENCOUNTER — Ambulatory Visit: Payer: Medicare Other | Admitting: "Endocrinology

## 2018-05-13 LAB — BASIC METABOLIC PANEL
BUN: 20 (ref 4–21)
Creatinine: 1.3 (ref 0.6–1.3)
Potassium: 4.1 (ref 3.4–5.3)
SODIUM: 134 — AB (ref 137–147)

## 2018-05-13 LAB — LIPID PANEL
CHOLESTEROL: 152 (ref 0–200)
HDL: 38 (ref 35–70)
LDL Cholesterol: 91
Triglycerides: 108 (ref 40–160)

## 2018-05-13 LAB — HEMOGLOBIN A1C: Hgb A1c MFr Bld: 7.6 — AB (ref 4.0–6.0)

## 2018-05-20 ENCOUNTER — Telehealth: Payer: Self-pay | Admitting: "Endocrinology

## 2018-05-20 ENCOUNTER — Other Ambulatory Visit: Payer: Self-pay | Admitting: "Endocrinology

## 2018-05-20 DIAGNOSIS — D472 Monoclonal gammopathy: Secondary | ICD-10-CM

## 2018-05-20 MED ORDER — INSULIN GLARGINE (1 UNIT DIAL) 300 UNIT/ML ~~LOC~~ SOPN: 40.0000 [IU] | PEN_INJECTOR | Freq: Every day | SUBCUTANEOUS | 2 refills | Status: DC

## 2018-05-20 NOTE — Telephone Encounter (Signed)
Dustin [name] is asking for a refill on Insulin Glargine (TOUJEO SOLOSTAR) 300 UNIT/ML SOPN  To [facility]

## 2018-05-25 ENCOUNTER — Ambulatory Visit (INDEPENDENT_AMBULATORY_CARE_PROVIDER_SITE_OTHER): Payer: Medicare Other | Admitting: "Endocrinology

## 2018-05-25 ENCOUNTER — Encounter: Payer: Self-pay | Admitting: "Endocrinology

## 2018-05-25 VITALS — BP 133/64 | HR 77 | Ht 66.5 in | Wt 187.0 lb

## 2018-05-25 DIAGNOSIS — I10 Essential (primary) hypertension: Secondary | ICD-10-CM

## 2018-05-25 DIAGNOSIS — E1159 Type 2 diabetes mellitus with other circulatory complications: Secondary | ICD-10-CM | POA: Diagnosis not present

## 2018-05-25 DIAGNOSIS — E782 Mixed hyperlipidemia: Secondary | ICD-10-CM

## 2018-05-25 MED ORDER — METFORMIN HCL 500 MG PO TABS: 500.0000 mg | ORAL_TABLET | Freq: Two times a day (BID) | ORAL | 2 refills | Status: DC

## 2018-05-25 NOTE — Progress Notes (Signed)
Endocrinology follow-up note       05/25/2018, 5:27 PM   Subjective:    Patient ID: Dustin [name], male    DOB: 1941-01-01.  Dustin [name] is being seen in follow-up for management of currently uncontrolled symptomatic type 2 diabetes, hyperlipidemia, hypertension. PMD:   Robley Fries, MD.   Past Medical History:  Diagnosis Date  . A-fib ([place])   . Arthritis    "hips" (12/15/2017)  . CKD (chronic kidney disease), stage III ([place])   . High cholesterol   . Hypertension   . OSA on CPAP   . Pneumonia ~ 2017 X1  . PVD (peripheral vascular disease) ([place]) 2008   Infreareanl abdominal aorta covered stent 11/2017. Prior b/l common iliac stents 2008, Lt EIA PTA 11/2017  . Type II diabetes mellitus ([place])    Past Surgical History:  Procedure Laterality Date  . ABDOMINAL AORTOGRAM N/A 12/08/2017   Procedure: ABDOMINAL AORTOGRAM;  Surgeon: Nigel Mormon, MD;  Location: [facility];  Service: Cardiovascular;  Laterality: N/A;  . CARDIAC CATHETERIZATION  1997; 09/2017  . COLONOSCOPY WITH PROPOFOL N/A 07/23/2017   Procedure: COLONOSCOPY WITH PROPOFOL;  Surgeon: Juanita Craver, MD;  Location: [facility];  Service: Endoscopy;  Laterality: N/A;  . CORONARY ANGIOPLASTY WITH STENT PLACEMENT  2008  . CORONARY ARTERY BYPASS GRAFT  1992   "CABG X3"  . LOWER EXTREMITY ANGIOGRAPHY N/A 12/08/2017   Procedure: LOWER EXTREMITY ANGIOGRAPHY;  Surgeon: Nigel Mormon, MD;  Location: [facility];  Service: Cardiovascular;  Laterality: N/A;  . LOWER EXTREMITY ANGIOGRAPHY N/A 12/15/2017   Procedure: LOWER EXTREMITY ANGIOGRAPHY - Left Leg;  Surgeon: Nigel Mormon, MD;  Location: [facility];  Service: Cardiovascular;  Laterality: N/A;  . PERIPHERAL VASCULAR BALLOON ANGIOPLASTY  12/08/2017   Procedure: PERIPHERAL VASCULAR BALLOON ANGIOPLASTY;  Surgeon: Nigel Mormon, MD;  Location: [facility];  Service: Cardiovascular;;  right common iliac  . PERIPHERAL VASCULAR INTERVENTION  12/08/2017   Procedure: PERIPHERAL VASCULAR INTERVENTION;  Surgeon: Nigel Mormon, MD;  Location: [facility];  Service: Cardiovascular;;  abdominal aorta stent   Social History   Socioeconomic History  . Marital status: Married    Spouse name: Not on file  . Number of children: Not on file  . Years of education: Not on file  . Highest education level: Not on file  Occupational History  . Not on file  Social Needs  . Financial resource strain: Not on file  . Food insecurity:    Worry: Not on file    Inability: Not on file  . Transportation needs:    Medical: Not on file    Non-medical: Not on file  Tobacco Use  . Smoking status: Never Smoker  . Smokeless tobacco: Never Used  Substance and Sexual Activity  . Alcohol use: Never    Frequency: Never  . Drug use: Never  . Sexual activity: Not on file  Lifestyle  . Physical activity:    Days per week: Not on file    Minutes per session: Not on file  . Stress: Not  on file  Relationships  . Social connections:    Talks on phone: Not on file    Gets together: Not on file    Attends religious service: Not on file    Active member of club or organization: Not on file    Attends meetings of clubs or organizations: Not on file    Relationship status: Not on file  Other Topics Concern  . Not on file  Social History Narrative  . Not on file   Outpatient Encounter Medications as of 05/25/2018  Medication Sig  . acetaminophen (TYLENOL) 650 MG CR tablet Take 650 mg by mouth 3 (three) times daily.  . Cholecalciferol (VITAMIN D3) 5000 UNITS CAPS Take 5,000 Units by mouth daily.   . finasteride (PROSCAR) 5 MG tablet Take 1 tablet (5 mg total) by mouth daily.  . furosemide (LASIX) 40 MG tablet Take 1 tablet (40 mg total) by mouth daily. HOLD FOR 2 DAYS, then may restart at 20mg  daily (1/2 tab)  . Insulin Glargine, 1 Unit Dial, 300  UNIT/ML SOPN Inject 40 Units into the skin at bedtime.  . isosorbide-hydrALAZINE (BIDIL) 20-37.5 MG tablet Take 1 tablet by mouth 3 (three) times daily.  . metFORMIN (GLUCOPHAGE) 500 MG tablet Take 1 tablet (500 mg total) by mouth 2 (two) times daily with a meal.  . metoprolol (LOPRESSOR) 50 MG tablet Take 50 mg by mouth 2 (two) times daily.  . potassium chloride SA (K-DUR,KLOR-CON) 20 MEQ tablet Take 1 tablet (20 mEq total) by mouth 2 (two) times daily. HOLD when not on lasix  . Rivaroxaban (XARELTO) 15 MG TABS tablet Take 15 mg by mouth every evening.   . rosuvastatin (CRESTOR) 20 MG tablet Take 20 mg by mouth at bedtime.  . tamsulosin (FLOMAX) 0.4 MG CAPS capsule daily.  . timolol (TIMOPTIC) 0.5 % ophthalmic solution Place 1 drop into both eyes daily.  . Travoprost, BAK Free, (TRAVATAN) 0.004 % SOLN ophthalmic solution Place 1 drop into both eyes at bedtime.   . vitamin A 25000 UNIT capsule Take 25,000 Units by mouth daily.  . [DISCONTINUED] gabapentin (NEURONTIN) 300 MG capsule Take 300 mg by mouth every 12 (twelve) hours.  . [DISCONTINUED] insulin aspart (NOVOLOG) 100 UNIT/ML injection Inject 0-5 Units into the skin See admin instructions. If CBG is 61-200= 0 units 201-250= 2 units 251-300= 3 units 301-350= 4 units 351+= 5 units  . [DISCONTINUED] oxyCODONE (OXY IR/ROXICODONE) 5 MG immediate release tablet Take 1 tablet (5 mg total) by mouth every 6 (six) hours as needed for severe pain.  . [DISCONTINUED] polyethylene glycol (MIRALAX / GLYCOLAX) packet Take 17 g by mouth daily.  . [DISCONTINUED] TOUJEO SOLOSTAR 300 UNIT/ML SOPN INJECT 40 UNITS INTO THE SKIN AT BEDTIME   No facility-administered encounter medications on file as of 05/25/2018.     ALLERGIES: No Known Allergies  VACCINATION STATUS: Immunization History  Administered Date(s) Administered  . DT 02/14/2004  . Influenza Whole 03/30/2009, 04/26/2011, 03/25/2012  . Influenza-Unspecified 05/30/1999, 05/07/2000, 05/07/2001,  04/13/2002, 04/15/2003, 04/25/2004, 05/20/2005, 04/30/2006, 05/06/2007, 06/01/2008, 03/16/2009, 03/21/2015, 04/08/2017  . Pneumococcal Conjugate-13 08/05/2013  . Pneumococcal-Unspecified 02/14/2004, 09/08/2011  . Zoster 12/02/2006    Diabetes  He presents for his follow-up diabetic visit. He has type 2 diabetes mellitus. Onset time: Patient was diagnosed at approximate age of 30 years. His disease course has been improving. There are no hypoglycemic associated symptoms. Pertinent negatives for hypoglycemia include no confusion, headaches, pallor or seizures. There are no diabetic associated symptoms. Pertinent negatives for  diabetes include no chest pain, no fatigue, no polydipsia, no polyphagia, no polyuria and no weakness. There are no hypoglycemic complications. Symptoms are improving. Diabetic complications include heart disease, nephropathy and PVD. Risk factors for coronary artery disease include diabetes mellitus, dyslipidemia, family history, hypertension, male sex, obesity and sedentary lifestyle. Current diabetic treatment includes insulin injections and oral agent (monotherapy). He is compliant with treatment most of the time. His weight is increasing steadily. He is following a generally unhealthy diet. When asked about meal planning, he reported none. He has not had a previous visit with a dietitian. Exercise: He has severe bilateral hip arthritis being considered for hip replacement, walks with walker, limited range of motion. His breakfast blood glucose range is generally 130-140 mg/dl. His bedtime blood glucose range is generally 140-180 mg/dl. His overall blood glucose range is 140-180 mg/dl. (He did not bring any meter nor logs to review.  He did not do his previsit labs either.) An ACE inhibitor/angiotensin II receptor blocker is being taken. He sees a podiatrist.Eye exam is current.  Hyperlipidemia  This is a chronic problem. The current episode started more than 1 year ago. Exacerbating  diseases include diabetes. Pertinent negatives include no chest pain, myalgias or shortness of breath. Risk factors for coronary artery disease include diabetes mellitus, dyslipidemia, male sex, a sedentary lifestyle and hypertension.  Hypertension  This is a chronic problem. The current episode started more than 1 year ago. The problem is controlled. Pertinent negatives include no chest pain, headaches, neck pain, palpitations or shortness of breath. Risk factors for coronary artery disease include dyslipidemia, diabetes mellitus, male gender, sedentary lifestyle and family history. Past treatments include ACE inhibitors. Hypertensive end-organ damage includes PVD.    Review of Systems  Constitutional: Negative for chills, fatigue, fever and unexpected weight change.  HENT: Negative for dental problem, mouth sores and trouble swallowing.   Eyes: Negative for visual disturbance.  Respiratory: Negative for cough, choking, chest tightness, shortness of breath and wheezing.   Cardiovascular: Negative for chest pain, palpitations and leg swelling.  Gastrointestinal: Negative for abdominal distention, abdominal pain, constipation, diarrhea, nausea and vomiting.  Endocrine: Negative for polydipsia, polyphagia and polyuria.  Genitourinary: Negative for dysuria, flank pain, hematuria and urgency.  Musculoskeletal: Positive for gait problem. Negative for back pain, myalgias and neck pain.       Walks with a walker due to bilateral hip arthritis being considered for hip replacement.  Skin: Negative for pallor, rash and wound.  Neurological: Negative for seizures, syncope, weakness, numbness and headaches.  Psychiatric/Behavioral: Negative for confusion and dysphoric mood.    Objective:    BP 133/64   Pulse 77   Ht 5' 6.5" (1.689 m)   Wt 187 lb (84.8 kg)   BMI 29.73 kg/m   Wt Readings from Last 3 Encounters:  05/25/18 187 lb (84.8 kg)  03/29/18 186 lb 8.2 oz (84.6 kg)  01/28/18 187 lb 4 oz (84.9  kg)     Physical Exam  Constitutional: He is oriented to person, place, and time. He appears well-developed. He is cooperative. No distress.  HENT:  Head: Normocephalic and atraumatic.  Eyes: EOM are normal.  Neck: Normal range of motion. Neck supple. No tracheal deviation present. No thyromegaly present.  Cardiovascular: Normal rate, S1 normal and S2 normal. Exam reveals no gallop.  No murmur heard. Pulses:      Dorsalis pedis pulses are 1+ on the right side, and 1+ on the left side.  Posterior tibial pulses are 1+ on the right side, and 1+ on the left side.  Pulmonary/Chest: Effort normal. No respiratory distress. He has no wheezes.  Abdominal: He exhibits no distension. There is no tenderness. There is no guarding and no CVA tenderness.  Musculoskeletal: He exhibits no edema.       Right shoulder: He exhibits no swelling and no deformity.  Walks with a walker due to bilateral hip arthritis , status post right hip replacement, and being considered for left hip replacement in March 2020.    He underwent bilateral lower extremity vascular surgery for peripheral arterial disease.  Neurological: He is alert and oriented to person, place, and time. He has normal strength. No cranial nerve deficit or sensory deficit. Gait normal.  Skin: Skin is warm and dry. No rash noted. No cyanosis. Nails show no clubbing.  Psychiatric: He has a normal mood and affect. His speech is normal. Judgment normal. Cognition and memory are normal.   CMP Latest Ref Rng & Units 05/13/2018 03/29/2018 03/28/2018  Glucose 70 - 99 mg/dL - 139(H) 183(H)  BUN 4 - 21 20 25(H) 31(H)  Creatinine 0.6 - 1.3 1.3 1.35(H) 1.52(H)  Sodium 137 - 147 134(A) 134(L) 132(L)  Potassium 3.4 - 5.3 4.1 4.6 4.8  Chloride 98 - 111 mmol/L - 105 103  CO2 22 - 32 mmol/L - 24 21(L)  Calcium 8.9 - 10.3 mg/dL - 8.8(L) 8.5(L)  Total Protein 6.4 - 8.3 g/dL - - -  Total Bilirubin 0.2 - 1.2 mg/dL - - -  Alkaline Phos 40 - 150 U/L - - -   AST 5 - 34 U/L - - -  ALT 0 - 55 U/L - - -    Lipid Panel     Component Value Date/Time   CHOL 152 05/13/2018   TRIG 108 05/13/2018   HDL 38 05/13/2018   LDLCALC 91 05/13/2018     Assessment & Plan:   1. DM type 2 causing vascular disease status post coronary artery bypass graft and renal insufficiency which has shown significant improvement recently.   - KRISHAWN VANDERWEELE has currently uncontrolled symptomatic type 2 DM since 77 years of age. -He presents with above target glycemic profile A1c of 7.6% increasing from 6.5%.     -his diabetes is complicated by coronary artery disease status post coronary artery bypass graft in 1992, stage 1-2 renal insufficiency, sedentary life due to severe bilateral hip arthritis and YISROEL MULLENDORE remains at a high risk for more acute and chronic complications which include CAD, CVA, CKD, retinopathy, and neuropathy. These are all discussed in detail with the patient.  - I have counseled him on diet management and weight loss, by adopting a carbohydrate restricted/protein rich diet. -  Suggestion is made for him to avoid simple carbohydrates  from his diet including Cakes, Sweet Desserts / Pastries, Ice Cream, Soda (diet and regular), Sweet Tea, Candies, Chips, Cookies, Store Bought Juices, Alcohol in Excess of  1-2 drinks a day, Artificial Sweeteners, and "Sugar-free" Products. This will help patient to have stable blood glucose profile and potentially avoid unintended weight gain.  - I encouraged him to switch to  unprocessed or minimally processed complex starch and increased protein intake (animal or plant source), fruits, and vegetables.  - he is advised to stick to a routine mealtimes to eat 3 meals  a day and avoid unnecessary snacks ( to snack only to correct hypoglycemia).   - I have approached him with the  following individualized plan to manage diabetes and patient agrees:    -Based on his presentation with above target glycemic profile  at fasting, he will benefit from a higher dose of insulin to control diabetes to target before his planned left hip surgery in March 2020.  He is status post right hip replacement, recovering very well.   -He is advised to increase his Toujeo to 50 units nightly, associated with strict monitoring of blood glucose 2 times a day-daily before breakfast and at bedtime. - Patient is warned not to take insulin without proper monitoring per orders. -Patient is encouraged to call clinic for blood glucose levels less than 70 or above 200 mg /dl. -Given his improved renal function, he would benefit from metformin treatment.  I discussed and reinitiated low-dose metformin at 500 mg p.o. twice daily-daily after breakfast and supper.  -Patient is not a candidate for metformin, SGLT2 inhibitors due to CKD.  - he will be considered for weekly injectable incretin therapy if necessary on subsequent visits.   - Patient specific target  A1c;  LDL, HDL, Triglycerides, and  Waist Circumference were discussed in detail.  2) BP/HTN: His blood pressure is controlled to target.   He is advised to continue his current blood pressure medications including hydralazine, lisinopril 40 mg p.o. daily, metoprolol 50 mg p.o. twice daily.    3) Lipids/HPL:   No recent lipid panel to review.  His lipid panel from October 13, 2016 showed LDL of 131.  He is advised to continue Crestor 20 mg p.o. nightly.     4)  Weight/Diet: CDE Consult has been initiated , exercise, and detailed carbohydrates information provided.  5) Chronic Care/Health Maintenance:  -he  is on ACEI/ARB and Statin medications and  is encouraged to continue to follow up with Ophthalmology, Dentist,  Podiatrist at least yearly or according to recommendations, and advised to  stay away from smoking. I have recommended yearly flu vaccine and pneumonia vaccination at least every 5 years; moderate intensity exercise for up to 150 minutes weekly; and  sleep for at least 7  hours a day.  -He is status post vascular surgery, waiting for hip surgery.  He is clear for planned surgery from diabetes point of view.    - I advised patient to maintain close follow up with Jaff, Salli Quarry, MD for primary care needs. - Time spent with the patient: 25 min, of which >50% was spent in reviewing his blood glucose logs , discussing his hypo- and hyper-glycemic episodes, reviewing his current and  previous labs and insulin doses and developing a plan to avoid hypo- and hyper-glycemia. Please refer to Patient Instructions for Blood Glucose Monitoring and Insulin/Medications Dosing Guide"  in media tab for additional information. [name] participated in the discussions, expressed understanding, and voiced agreement with the above plans.  All questions were answered to his satisfaction. he is encouraged to contact clinic should he have any questions or concerns prior to his return visit.    Follow up plan: - Return in about 4 months (around 09/23/2018) for Follow up with Pre-visit Labs, Meter, and Logs.  Glade Lloyd, MD [facility] [address] [place], [place] [address] Phone: [phone]  Fax: [phone]    05/25/2018, 5:27 PM  This note was partially dictated with voice recognition software. Similar sounding words can be transcribed inadequately or may not  be corrected upon review.

## 2018-05-25 NOTE — Patient Instructions (Signed)

## 2018-07-24 ENCOUNTER — Other Ambulatory Visit: Payer: Self-pay | Admitting: Cardiology

## 2018-07-24 DIAGNOSIS — R0989 Other specified symptoms and signs involving the circulatory and respiratory systems: Secondary | ICD-10-CM

## 2018-07-24 DIAGNOSIS — I350 Nonrheumatic aortic (valve) stenosis: Secondary | ICD-10-CM

## 2018-08-12 ENCOUNTER — Other Ambulatory Visit: Payer: Self-pay | Admitting: "Endocrinology

## 2018-08-19 ENCOUNTER — Other Ambulatory Visit: Payer: Self-pay

## 2018-08-23 ENCOUNTER — Other Ambulatory Visit: Payer: Self-pay | Admitting: Cardiology

## 2018-08-30 ENCOUNTER — Ambulatory Visit: Payer: Medicare Other

## 2018-08-30 ENCOUNTER — Telehealth: Payer: Self-pay

## 2018-08-30 DIAGNOSIS — I350 Nonrheumatic aortic (valve) stenosis: Secondary | ICD-10-CM | POA: Diagnosis not present

## 2018-08-30 DIAGNOSIS — R0989 Other specified symptoms and signs involving the circulatory and respiratory systems: Secondary | ICD-10-CM | POA: Diagnosis not present

## 2018-09-02 ENCOUNTER — Other Ambulatory Visit: Payer: Self-pay | Admitting: Cardiology

## 2018-09-15 ENCOUNTER — Inpatient Hospital Stay: Payer: Medicare Other

## 2018-09-15 ENCOUNTER — Inpatient Hospital Stay: Payer: Medicare Other | Admitting: Hematology

## 2018-09-23 ENCOUNTER — Other Ambulatory Visit: Payer: Self-pay | Admitting: Cardiology

## 2018-10-21 ENCOUNTER — Other Ambulatory Visit: Payer: Self-pay | Admitting: Cardiology

## 2018-10-27 ENCOUNTER — Other Ambulatory Visit: Payer: Self-pay

## 2018-10-27 ENCOUNTER — Encounter: Payer: Self-pay | Admitting: Cardiology

## 2018-10-27 ENCOUNTER — Ambulatory Visit (INDEPENDENT_AMBULATORY_CARE_PROVIDER_SITE_OTHER): Payer: Medicare Other | Admitting: Cardiology

## 2018-10-27 VITALS — BP 135/67 | HR 64 | Ht 67.0 in | Wt 185.0 lb

## 2018-10-27 DIAGNOSIS — I25708 Atherosclerosis of coronary artery bypass graft(s), unspecified, with other forms of angina pectoris: Secondary | ICD-10-CM | POA: Insufficient documentation

## 2018-10-27 DIAGNOSIS — I4819 Other persistent atrial fibrillation: Secondary | ICD-10-CM | POA: Diagnosis not present

## 2018-10-27 DIAGNOSIS — I350 Nonrheumatic aortic (valve) stenosis: Secondary | ICD-10-CM | POA: Diagnosis not present

## 2018-10-27 DIAGNOSIS — I25810 Atherosclerosis of coronary artery bypass graft(s) without angina pectoris: Secondary | ICD-10-CM | POA: Insufficient documentation

## 2018-10-27 NOTE — Progress Notes (Signed)
Virtual Visit via Video Note   Subjective:   Dustin [name], male    DOB: September 14, 1940, 78 y.o.   MRN: [id number]   I connected with the patient on 10/27/18 by a video enabled telemedicine application and verified that I am speaking with the correct person using two identifiers.     I discussed the limitations of evaluation and management by telemedicine and the availability of in person appointments. The patient expressed understanding and agreed to proceed.   This visit type was conducted due to national recommendations for restrictions regarding the COVID-19 Pandemic (e.g. social distancing).  This format is felt to be most appropriate for this patient at this time.  All issues noted in this document were discussed and addressed.  No physical exam was performed (except for noted visual exam findings with Tele health visits).  The patient has consented to conduct a Tele health visit and understands insurance will be billed.     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  Since his last visit, his leg edema significantly improved.  Unfortunately, his right hip arthroplasty was postponed, initially due to uncontrolled diabetes, and later to the last pandemic.  He has no new complaints at this time.  Blood pressure is well controlled.  He is compliant with his medical therapy. He is ambulating inside his house without significant difficulty.   Echocardiogram and carotid US findings were reviewed  with the patient.    Past Medical History:  Diagnosis Date  . A-fib ([place])   . Arthritis    "hips" (12/15/2017)  . CKD (chronic kidney disease), stage III ([place])   . High cholesterol   . Hypertension   . OSA on CPAP   . Pneumonia ~ 2017 X1  . PVD (peripheral vascular disease) ([place]) 2008   Infreareanl abdominal aorta covered stent 11/2017. Prior b/l common iliac stents 2008, Lt EIA PTA 11/2017  . Type II diabetes mellitus ([place])      Past Surgical History:  Procedure Laterality Date  . ABDOMINAL AORTOGRAM N/A 12/08/2017   Procedure: ABDOMINAL AORTOGRAM;  Surgeon: Nigel Mormon, MD;  Location: [place] [facility];  Service: Cardiovascular;  Laterality: N/A;  . CARDIAC CATHETERIZATION  1997; 09/2017  . COLONOSCOPY WITH PROPOFOL N/A 07/23/2017   Procedure: COLONOSCOPY WITH PROPOFOL;  Surgeon: Juanita Craver, MD;  Location: [facility];  Service: Endoscopy;  Laterality: N/A;  . CORONARY ANGIOPLASTY WITH STENT PLACEMENT  2008  . CORONARY ARTERY BYPASS GRAFT  1992   "CABG X3"  . LOWER EXTREMITY ANGIOGRAPHY N/A 12/08/2017   Procedure: LOWER EXTREMITY ANGIOGRAPHY;  Surgeon: Nigel Mormon, MD;  Location: [place] [facility];  Service: Cardiovascular;  Laterality: N/A;  . LOWER EXTREMITY ANGIOGRAPHY N/A 12/15/2017   Procedure: LOWER EXTREMITY ANGIOGRAPHY - Left Leg;  Surgeon: Nigel Mormon, MD;  Location: [place] [facility];  Service: Cardiovascular;  Laterality: N/A;  . PERIPHERAL VASCULAR BALLOON ANGIOPLASTY  12/08/2017   Procedure: PERIPHERAL VASCULAR BALLOON ANGIOPLASTY;  Surgeon: Nigel Mormon, MD;  Location: [place] [facility];  Service: Cardiovascular;;  right common iliac  . PERIPHERAL VASCULAR INTERVENTION  12/08/2017   Procedure: PERIPHERAL VASCULAR INTERVENTION;  Surgeon: Nigel Mormon, MD;  Location: [place] [facility];  Service: Cardiovascular;;  abdominal aorta stent     Social History   Socioeconomic History  . Marital status: Married    Spouse  name: Not on file  . Number of children: Not on file  . Years of education: Not on file  . Highest education level: Not on file  Occupational History  . Not on file  Social Needs  . Financial resource strain: Not on file  . Food insecurity:    Worry: Not on file    Inability: Not on file  . Transportation needs:    Medical: Not on file    Non-medical: Not on file  Tobacco Use  . Smoking status: Never Smoker  . Smokeless tobacco: Never Used  Substance and Sexual Activity  . Alcohol use: Never    Frequency: Never  . Drug use: Never  . Sexual activity: Not on file  Lifestyle  . Physical activity:    Days per week: Not on file    Minutes per session: Not on file  . Stress: Not on file  Relationships  . Social connections:    Talks on phone: Not on file    Gets together: Not on file    Attends religious service: Not on file    Active member of club or organization: Not on file    Attends meetings of clubs or organizations: Not on file    Relationship status: Not on file  . Intimate partner violence:    Fear of current or ex partner: Not on file    Emotionally abused: Not on file    Physically abused: Not on file    Forced sexual activity: Not on file  Other Topics Concern  . Not on file  Social History Narrative  . Not on file     Family History  Problem Relation Age of Onset  . Heart failure Mother   . Heart failure Father   . Diabetes Sister   . Congenital heart disease Sister   . Cancer Brother 30       prostate and colon cancer      Current Outpatient Medications on File Prior to Visit  Medication Sig Dispense Refill  . acetaminophen (TYLENOL) 650 MG CR tablet Take 650 mg by mouth 3 (three) times daily.    Marland Kitchen BIDIL 20-37.5 MG tablet TAKE 1 TABLET BY MOUTH THREE TIMES DAILY 180 tablet 3  . Cholecalciferol (VITAMIN D3) 5000 UNITS CAPS Take 5,000 Units by mouth daily.     . finasteride (PROSCAR) 5 MG tablet Take 1 tablet (5 mg total) by mouth daily. 30 tablet  3  . furosemide (LASIX) 40 MG tablet Take 1 tablet (40 mg total) by mouth daily. HOLD FOR 2 DAYS, then may restart at 64m daily (1/2 tab) 30 tablet 3  . Insulin Glargine, 1 Unit Dial, (TOUJEO SOLOSTAR) 300 UNIT/ML SOPN Inject 50 Units into the skin at bedtime. 4.5 mL 2  . metFORMIN (GLUCOPHAGE) 500 MG tablet Take 1 tablet (500 mg total) by mouth 2 (two) times daily with a meal. 60 tablet 2  . metoprolol (LOPRESSOR) 50 MG tablet Take 50 mg by mouth 2 (two) times daily.    . Potassium Chloride ER 20 MEQ TBCR TAKE 1 TABLET BY MOUTH TWICE DAILY 180 tablet 0  . potassium chloride SA (K-DUR,KLOR-CON)  20 MEQ tablet Take 1 tablet (20 mEq total) by mouth 2 (two) times daily. HOLD when not on lasix    . Rivaroxaban (XARELTO) 15 MG TABS tablet Take 15 mg by mouth every evening.     . rosuvastatin (CRESTOR) 20 MG tablet Take 20 mg by mouth at bedtime.    . tamsulosin (FLOMAX) 0.4 MG CAPS capsule daily.  3  . timolol (TIMOPTIC) 0.5 % ophthalmic solution Place 1 drop into both eyes daily.    . Travoprost, BAK Free, (TRAVATAN) 0.004 % SOLN ophthalmic solution Place 1 drop into both eyes at bedtime.     . vitamin A 25000 UNIT capsule Take 25,000 Units by mouth daily.     No current facility-administered medications on file prior to visit.     Cardiovascular studies:  Cath 12/08/2017 & 12/15/2017: Abdominal aorta: Severe ectatic 80% stenosis distal aorta bifurcation-->Successful PTA Rt CIA and covered stent placement Infrarenal abdominal aorta (Viabahn 10.0 X 39 mm covered stent) Bilateral renal arteries: Normal Rt renal artery. Lt renal artery with moderate diffuse disease (limited visualization with CO2) Lt CIA 60% stenosis-->successful PTA with 0% residual stenosis Rt CIA severe 80% restenosis (prior stent)-->successful PTA [place]ang 6.0 mm balloon.  Mild restenosis Lt CIA  Lt EIA with distal 95% stenosis, successful drug coated PTA 6.0 X 120 mm In.Pact with 0% residual stenosis Lt common femoral artery  80% stenosis, successful drug coated PTA 6.0 X 120 mm In.Pact with 30% residual stenosis Bilateral SFA: Prox occluded with collaterals seen  Cath 08/18/2017: LM: 100% LAD Prox 100%, mid 50%, D1 100% LCx: 100%, OM2 90% RCA: Prox 100%, distal 100%, PDA 100% LIMA-LAD: Patent SVG-D1 100% SVG-RCA: 100% SVG-OM2: 20%  Echocardiogram 08/06/2017: LVEF >55%. Mod LA enlargement. Mild RA enlargement Mild calcific AS. Mean PG 10 mmHg, Vmax 2.2 m/sec AVA 1.2 cm2 SVI 27 ml/m2 Mild TR, PASP 39 mmHg  LE arterial ultrasound 01/13/2018: Final Interpretation: Right: Significant plaque morphology and dampened waveforms in the right lower extremity suggest peripheral vascular disease with possible proximal obstruction. There is no obvious evidence of hemodynamically significant stenosis involving the right  lower extremity arteries.    Left: Significant plaque morphology and dampened waveforms in the left lower extremity suggest peripheral vascular disease with possible proximal obstruction. The superficial femoral artery is occluded in its mid to distal segments with reconstitution at  the popliteal artery.  Lower extremity venous duplex 06/02/2018: No DVT seen here at  Carotid artery duplex  08/30/2018 Mild mixed plaque bilateral carotid arteries without significant stenosis. Antegrade right vertebral artery flow. Left vertebral artery waveform shows high resistance and may suggest distal stenosis.  Antegrade left vertebral artery flow. Overall no significant change from 11/10/2017.  Recent labs: 07/17/2018: Glucose 198. BUN/Cr 22/1.61. eGFR 47. Na/K 139/5.1   Review of Systems  Constitution: Negative for decreased appetite, malaise/fatigue, weight gain and weight loss.  HENT: Negative for congestion.   Eyes: Negative for visual disturbance.  Cardiovascular: Negative for chest pain, dyspnea on exertion, leg swelling, palpitations and syncope.  Respiratory: Negative for shortness of breath.    Endocrine: Negative for cold intolerance.  Hematologic/Lymphatic: Does not bruise/bleed easily.  Skin: Negative for itching and rash.  Musculoskeletal: Negative for myalgias.       Right hip pain  Gastrointestinal: Negative for abdominal pain, nausea and vomiting.  Genitourinary: Negative for dysuria.  Neurological: Negative for dizziness and weakness.  Psychiatric/Behavioral: The patient is not nervous/anxious.   All other systems reviewed and are negative.  Vitals:   10/27/18 1412  BP: 135/67  Pulse: 64   (Measured by the patient using a home BP monitor)   Observation/findings during video visit   Objective:    Physical Exam  Constitutional: He is oriented to person, place, and time. He appears well-developed and well-nourished. No distress.  Pulmonary/Chest: Effort normal.  Musculoskeletal:        General: No edema.  Neurological: He is alert and oriented to person, place, and time.  Psychiatric: He has a normal mood and affect.  Nursing note and vitals reviewed.         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  Leg edema: Much improved.Continue lasix 40 mg daily.  Prop evaluation: While he has known CAD, PAD, I do not see any prohibitive reason for him to proceed with right arthroplasty, with osteoarthritis causing life limiting symptoms to him. Aortic stenosis is mild to moderate and stable on recent echocardiogram.  CAD: Known coronary artery disease with stable  angina. Blood pressure fairly well controlled. His perioperative cardiac risk remains elevated, but unlikely to be mitigated by any invasive revascularization attempts. Cardiac risk is not prohibitive. He is on appropriate medical therapy at this time. Continue the same.  Hypertension: Continue current antihypertensive therapy. Resume lisinopril at 20 mg daily. Repeat BMP next month.  Persistent Afib:  Controlled ventricular rate. Continue anticoagulation with Xarelto 15 mg daily. BMP from today reviewed.  Mild carotid stenosis: Continue medical maangement   Follow up in 3 months   [place], MD [facility] Pager: [phone] Office: [phone] If no answer Cell [phone]

## 2018-10-29 ENCOUNTER — Other Ambulatory Visit: Payer: Self-pay

## 2018-10-29 DIAGNOSIS — I739 Peripheral vascular disease, unspecified: Secondary | ICD-10-CM

## 2018-10-29 MED ORDER — FUROSEMIDE 40 MG PO TABS: 40.0000 mg | ORAL_TABLET | Freq: Every day | ORAL | 3 refills | Status: DC

## 2018-11-09 ENCOUNTER — Other Ambulatory Visit: Payer: Self-pay | Admitting: "Endocrinology

## 2019-01-26 ENCOUNTER — Other Ambulatory Visit: Payer: Self-pay | Admitting: "Endocrinology

## 2019-01-31 ENCOUNTER — Telehealth: Payer: Self-pay | Admitting: "Endocrinology

## 2019-01-31 DIAGNOSIS — E1159 Type 2 diabetes mellitus with other circulatory complications: Secondary | ICD-10-CM

## 2019-01-31 MED ORDER — TOUJEO SOLOSTAR 300 UNIT/ML ~~LOC~~ SOPN: 50.0000 [IU] | PEN_INJECTOR | Freq: Every day | SUBCUTANEOUS | 2 refills | Status: AC

## 2019-01-31 NOTE — Telephone Encounter (Signed)
Patient's wife left VM that he needs refill on TOUJEO SOLOSTAR 300 UNIT/ML SOPN

## 2019-01-31 NOTE — Telephone Encounter (Signed)
Patient made an appointment and had labs drawn

## 2019-02-02 ENCOUNTER — Telehealth: Payer: Self-pay | Admitting: "Endocrinology

## 2019-02-02 ENCOUNTER — Ambulatory Visit: Payer: Medicare Other | Admitting: "Endocrinology

## 2019-02-02 NOTE — Telephone Encounter (Signed)
PATIENT NEEDS UPDATED LAB ORDER FAXED TO [facility]

## 2019-02-03 NOTE — Telephone Encounter (Signed)
Lab order faxed. No new order entered since they will not be going to [facility]

## 2019-03-30 ENCOUNTER — Other Ambulatory Visit: Payer: Self-pay

## 2019-03-30 ENCOUNTER — Telehealth (INDEPENDENT_AMBULATORY_CARE_PROVIDER_SITE_OTHER): Payer: Medicare Other | Admitting: Cardiology

## 2019-03-30 DIAGNOSIS — R0989 Other specified symptoms and signs involving the circulatory and respiratory systems: Secondary | ICD-10-CM

## 2019-03-30 DIAGNOSIS — I350 Nonrheumatic aortic (valve) stenosis: Secondary | ICD-10-CM | POA: Diagnosis not present

## 2019-03-30 DIAGNOSIS — I739 Peripheral vascular disease, unspecified: Secondary | ICD-10-CM

## 2019-03-30 DIAGNOSIS — I4819 Other persistent atrial fibrillation: Secondary | ICD-10-CM | POA: Diagnosis not present

## 2019-03-30 DIAGNOSIS — I25810 Atherosclerosis of coronary artery bypass graft(s) without angina pectoris: Secondary | ICD-10-CM | POA: Diagnosis not present

## 2019-03-30 NOTE — Progress Notes (Signed)
Subjective:   Dustin [name], male    DOB: [birth year], 78 y.o.   MRN: [id number]   I connected with the patient on 03/30/2019 by a telephone call and verified that I am speaking with the correct person using two identifiers.     I offered the patient a video enabled application for a virtual visit. Unfortunately, this could not be accomplished due to technical difficulties/lack of video enabled phone/computer. I discussed the limitations of evaluation and management by telemedicine and the availability of in person appointments. The patient expressed understanding and agreed to proceed.   This visit type was conducted due to national recommendations for restrictions regarding the COVID-19 Pandemic (e.g. social distancing).  This format is felt to be most appropriate for this patient at this time.  All issues noted in this document were discussed and addressed.  No physical exam was performed (except for noted visual exam findings with Tele health visits).  The patient has consented to conduct a Tele health visit and understands insurance will be billed.    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   Due to pandemic related delays, he has not had left hip surgery yet. He continues to have pain in his left hip. His right hip and leg pain is significantly improved since right hip surgery in 04/2018. He denies any leg pain on walking, but endorses numbness in legs mid calf down. He denies having any  wounds or ulcers on his feet. He has occasional leg edema, mainly in his feet. His BP runs140s/70s. He hasn't checked his weight recently. Diabetes is steady. He saw his endocrinologist Dr. Dorris Fetch 6 months ago. He saw his nephrologist Dr. Elmarie Shiley 2-3 wks ago, and had some labs checked. He has mild shortness of breath only when walking after eating meals.   Past Medical History:  Diagnosis Date  . A-fib ([place])   . Arthritis    "hips" (12/15/2017)  . CKD (chronic kidney disease), stage III ([place])   . High cholesterol   . Hypertension   . OSA on CPAP   . Pneumonia ~ 2017 X1  . PVD (peripheral vascular disease) ([place]) 2008   Infreareanl abdominal aorta covered stent 11/2017. Prior b/l common iliac stents 2008, Lt EIA PTA 11/2017  . Type II diabetes mellitus ([place])      Past Surgical History:  Procedure Laterality Date  . ABDOMINAL AORTOGRAM N/A 12/08/2017   Procedure: ABDOMINAL AORTOGRAM;  Surgeon: Nigel Mormon, MD;  Location: [facility];  Service: Cardiovascular;  Laterality: N/A;  . CARDIAC CATHETERIZATION  1997; 09/2017  . COLONOSCOPY WITH PROPOFOL N/A 07/23/2017   Procedure: COLONOSCOPY WITH PROPOFOL;  Surgeon: Juanita Craver, MD;  Location: [facility];  Service: Endoscopy;  Laterality: N/A;  . CORONARY ANGIOPLASTY WITH STENT PLACEMENT  2008  . CORONARY ARTERY BYPASS GRAFT  1992   "CABG X3"  . LOWER EXTREMITY ANGIOGRAPHY N/A 12/08/2017   Procedure: LOWER EXTREMITY ANGIOGRAPHY;  Surgeon: Nigel Mormon, MD;  Location: [facility];  Service:  Cardiovascular;  Laterality: N/A;  . LOWER EXTREMITY ANGIOGRAPHY N/A 12/15/2017   Procedure: LOWER EXTREMITY ANGIOGRAPHY - Left Leg;  Surgeon: Nigel Mormon, MD;  Location: [facility];  Service: Cardiovascular;  Laterality: N/A;  . PERIPHERAL VASCULAR BALLOON ANGIOPLASTY  12/08/2017   Procedure: PERIPHERAL VASCULAR BALLOON ANGIOPLASTY;  Surgeon: Nigel Mormon, MD;  Location: [facility];  Service:  Cardiovascular;;  right common iliac  . PERIPHERAL VASCULAR INTERVENTION  12/08/2017   Procedure: PERIPHERAL VASCULAR INTERVENTION;  Surgeon: Nigel Mormon, MD;  Location: [facility];  Service: Cardiovascular;;  abdominal aorta stent     Social History   Socioeconomic History  . Marital status: Married    Spouse name: Not on file  . Number of children: 3  . Years of education: Not on file  . Highest education level: Not on file  Occupational History  . Not on file  Social Needs  . Financial resource strain: Not on file  . Food insecurity    Worry: Not on file    Inability: Not on file  . Transportation needs    Medical: Not on file    Non-medical: Not on file  Tobacco Use  . Smoking status: Never Smoker  . Smokeless tobacco: Never Used  Substance and Sexual Activity  . Alcohol use: Never    Frequency: Never  . Drug use: Never  . Sexual activity: Not on file  Lifestyle  . Physical activity    Days per week: Not on file    Minutes per session: Not on file  . Stress: Not on file  Relationships  . Social Herbalist on phone: Not on file    Gets together: Not on file    Attends religious service: Not on file    Active member of club or organization: Not on file    Attends meetings of clubs or organizations: Not on file    Relationship status: Not on file  . Intimate partner violence    Fear of current or ex partner: Not on file    Emotionally abused: Not on file    Physically abused: Not on file    Forced sexual activity: Not on file  Other Topics Concern  . Not on file  Social History Narrative  . Not on file     Family History  Problem Relation Age of Onset  . Heart failure Mother   . Heart failure Father   . Diabetes Sister   . Congenital heart disease Sister   . Cancer Brother 75       prostate and colon cancer      Current Outpatient Medications on File Prior to Visit  Medication Sig Dispense Refill  . acetaminophen  (TYLENOL) 650 MG CR tablet Take 650 mg by mouth 3 (three) times daily.    Marland Kitchen BIDIL 20-37.5 MG tablet TAKE 1 TABLET BY MOUTH THREE TIMES DAILY 180 tablet 3  . bimatoprost (LUMIGAN) 0.03 % ophthalmic solution 1 drop at bedtime.    . Cholecalciferol (VITAMIN D3) 5000 UNITS CAPS Take 5,000 Units by mouth daily.     . furosemide (LASIX) 40 MG tablet Take 1 tablet (40 mg total) by mouth daily. 90 tablet 3  . Insulin Glargine, 1 Unit Dial, (TOUJEO SOLOSTAR) 300 UNIT/ML SOPN Inject 50 Units into the skin at bedtime. 4.5 mL 2  . metoprolol (LOPRESSOR) 50 MG tablet Take 50 mg by mouth 2 (two) times daily.    Marland Kitchen  Potassium Chloride ER 20 MEQ TBCR TAKE 1 TABLET BY MOUTH TWICE DAILY 180 tablet 0  . Rivaroxaban (XARELTO) 15 MG TABS tablet Take 15 mg by mouth every evening.     . rosuvastatin (CRESTOR) 20 MG tablet Take 20 mg by mouth at bedtime.    . tamsulosin (FLOMAX) 0.4 MG CAPS capsule daily.  3  . timolol (TIMOPTIC) 0.5 % ophthalmic solution Place 1 drop into both eyes daily.     No current facility-administered medications on file prior to visit.     Cardiovascular studies:  Carotid artery duplex  09-06-2018: Mild mixed plaque bilateral carotid arteries without significant stenosis. Antegrade right vertebral artery flow. Left vertebral artery waveform shows high resistance and may suggest distal stenosis.  Antegrade left vertebral artery flow. Overall no significant change from 11/10/2017.  Echocardiogram 09-06-18: Left ventricle cavity is normal in size. Moderate concentric hypertrophy of the left ventricle. Visual EF is 50-55%. Diastolic function could not be evaluated due to atrial fibrillation.  Left atrial cavity is mildly dilated. Mild aortic valve stenosis. Mild (Grade I) regurgitation. Aortic valve mean gradient of 9 mmHg, Vmax of 2.0  m/s. Calculated aortic valve area by continuity equation is 1.3 cm. Mild (Grade I) mitral regurgitation. Moderate tricuspid regurgitation. Estimated  pulmonary artery systolic pressure 30 mmHg. No significant change compared to previous study on 08/06/2017.   Peripheral angiography and intervention 12/08/2017 & 12/15/2017: Abdominal aorta: Severe ectatic 80% stenosis distal aorta bifurcation-->Successful PTA Rt CIA and covered stent placement Infrarenal abdominal aorta (Viabahn 10.0 X 39 mm covered stent) Bilateral renal arteries: Normal Rt renal artery. Lt renal artery with moderate diffuse disease (limited visualization with CO2) Lt CIA 60% stenosis-->successful PTA with 0% residual stenosis Rt CIA severe 80% restenosis (prior stent)-->successful PTA [place] 6.0 mm balloon.  Mild restenosis Lt CIA  Lt EIA with distal 95% stenosis, successful drug coated PTA 6.0 X 120 mm In.Pact with 0% residual stenosis Lt common femoral artery 80% stenosis, successful drug coated PTA 6.0 X 120 mm In.Pact with 30% residual stenosis Bilateral SFA: Prox occluded with collaterals seen  Cath 08/18/2017: LM: 100% LAD Prox 100%, mid 50%, D1 100% LCx: 100%, OM2 90% RCA: Prox 100%, distal 100%, PDA 100% LIMA-LAD: Patent SVG-D1 100% SVG-RCA: 100% SVG-OM2: 20%  Echocardiogram 08/06/2017: LVEF >55%. Mod LA enlargement. Mild RA enlargement Mild calcific AS. Mean PG 10 mmHg, Vmax 2.2 m/sec AVA 1.2 cm2 SVI 27 ml/m2 Mild TR, PASP 39 mmHg  LE arterial ultrasound 01/13/2018: Final Interpretation: Right: Significant plaque morphology and dampened waveforms in the right lower extremity suggest peripheral vascular disease with possible proximal obstruction. There is no obvious evidence of hemodynamically significant stenosis involving the right  lower extremity arteries.    Left: Significant plaque morphology and dampened waveforms in the left lower extremity suggest peripheral vascular disease with possible proximal obstruction. The superficial femoral artery is occluded in its mid to distal segments with reconstitution at  the popliteal artery.  Lower  extremity venous duplex 06/02/2018: No DVT seen  Carotid artery duplex  September 06, 2018 Mild mixed plaque bilateral carotid arteries without significant stenosis. Antegrade right vertebral artery flow. Left vertebral artery waveform shows high resistance and may suggest distal stenosis.  Antegrade left vertebral artery flow. Overall no significant change from 11/10/2017.  Recent labs: 07/17/2018: Glucose 198. BUN/Cr 22/1.61. eGFR 47. Na/K 139/5.1   Review of Systems  Constitution: Negative for decreased appetite, malaise/fatigue, weight gain and weight loss.  HENT: Negative for congestion.   Eyes: Negative for visual disturbance.  Cardiovascular: Negative for chest pain, dyspnea on exertion, leg swelling, palpitations and syncope.  Respiratory: Negative for shortness of breath.   Endocrine: Negative for cold intolerance.  Hematologic/Lymphatic: Does not bruise/bleed easily.  Skin: Negative for itching and rash.  Musculoskeletal: Negative for myalgias.       Right hip pain  Gastrointestinal: Negative for abdominal pain, nausea and vomiting.  Genitourinary: Negative for dysuria.  Neurological: Negative for dizziness and weakness.  Psychiatric/Behavioral: The patient is not nervous/anxious.   All other systems reviewed and are negative.       No vitals available.  Objective:    Physical Exam  Not performed. Telephone visit.         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  CAD: Known  coronary artery disease with stable angina. Blood pressure fairly well controlled. His perioperative for long pemding hip surgery cardiac risk remains elevated, but unlikely to be mitigated by any invasive revascularization attempts. Cardiac risk is not prohibitive. He is on appropriate medical therapy at this time. Continue the same.  Hypertension: Continue current antihypertensive therapy. Will obtain labs from Dr. Elmarie Shiley. Unless any renal contraindication, he should be on ACE/ARB.  Persistent Afib:  Controlled ventricular rate. Continue anticoagulation with Xarelto 15 mg daily. BMP from today reviewed.  Mild carotid stenosis: Continue medical maangement  I will see him for in office visit in 05/2019.   Nigel Mormon, MD [facility] [place] Pager: [phone] Office: [phone] If no answer Cell [phone]

## 2019-04-18 ENCOUNTER — Encounter: Payer: Self-pay | Admitting: Cardiology

## 2019-04-18 ENCOUNTER — Ambulatory Visit (INDEPENDENT_AMBULATORY_CARE_PROVIDER_SITE_OTHER): Payer: Medicare Other | Admitting: Cardiology

## 2019-04-18 ENCOUNTER — Other Ambulatory Visit: Payer: Self-pay

## 2019-04-18 VITALS — BP 169/72 | HR 73 | Temp 97.4°F | Ht 67.0 in | Wt 194.0 lb

## 2019-04-18 DIAGNOSIS — R600 Localized edema: Secondary | ICD-10-CM | POA: Diagnosis not present

## 2019-04-18 DIAGNOSIS — I350 Nonrheumatic aortic (valve) stenosis: Secondary | ICD-10-CM | POA: Diagnosis not present

## 2019-04-18 DIAGNOSIS — I4819 Other persistent atrial fibrillation: Secondary | ICD-10-CM | POA: Diagnosis not present

## 2019-04-18 DIAGNOSIS — I25810 Atherosclerosis of coronary artery bypass graft(s) without angina pectoris: Secondary | ICD-10-CM | POA: Diagnosis not present

## 2019-04-18 DIAGNOSIS — I739 Peripheral vascular disease, unspecified: Secondary | ICD-10-CM

## 2019-04-18 NOTE — Progress Notes (Signed)
Subjective:   Dustin [name], male    DOB: [birth year], 78 y.o.   MRN: [id number]   Chief complaint:  Aortic stenosis   HPI  78 year old African American male with hypertension, hyperlipidemia, hyperlipidemia, type 2 DM A1C 7.8%, coronary artery disease s/p CABG 1992, PCI is in 2000, 2001, peripheral artery disease status post staged intervention (infrarenal abdominal aorta covered stent, PTA Rt CIA restensois, PTA Lt EIA) in 11/2017, persistent Afib on Xarelto, OSA on CPAP, CKD 3, GERD, h/o MGUS followed by Dr. 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  Patient was recently admitted to hospital with fever 103 F. He was tested negative for COVID, and was treated for pneumonia with IV antibiotics-levofloxacin.  Since his hospital stay, he has noticed increased swelling in b/l LE.  He is also noticed exertional dyspnea.  He was treated with IV fluids during his pneumonia admission, records not available to me at this time.  Patient is unsure if he had an echocardiogram performed during his hospitalization.  Past Medical History:  Diagnosis Date  . A-fib ([place])   . Arthritis    "hips" (12/15/2017)  . CKD (chronic kidney disease), stage III ([place])   . High cholesterol   . Hypertension   . OSA on CPAP   . Pneumonia ~ 2017 X1  . PVD (peripheral vascular disease) ([place]) 2008   Infreareanl abdominal aorta covered stent 11/2017. Prior b/l common iliac stents 2008, Lt EIA PTA 11/2017  . Type II diabetes mellitus ([place])      Past Surgical History:  Procedure Laterality Date  . ABDOMINAL AORTOGRAM N/A 12/08/2017   Procedure: ABDOMINAL AORTOGRAM;  Surgeon: Nigel Mormon, MD;  Location: [facility];  Service: Cardiovascular;  Laterality: N/A;  . CARDIAC CATHETERIZATION  1997; 09/2017  . COLONOSCOPY WITH PROPOFOL N/A 07/23/2017   Procedure: COLONOSCOPY WITH PROPOFOL;  Surgeon: Juanita Craver, MD;  Location: [facility];  Service: Endoscopy;  Laterality: N/A;  . CORONARY ANGIOPLASTY WITH STENT PLACEMENT  2008  . CORONARY ARTERY BYPASS GRAFT  1992   "CABG X3"  . LOWER EXTREMITY ANGIOGRAPHY N/A 12/08/2017   Procedure: LOWER EXTREMITY ANGIOGRAPHY;  Surgeon: Nigel Mormon, MD;  Location: [facility];  Service: Cardiovascular;  Laterality: N/A;  . LOWER EXTREMITY ANGIOGRAPHY N/A 12/15/2017   Procedure: LOWER EXTREMITY ANGIOGRAPHY - Left Leg;  Surgeon: Nigel Mormon, MD;  Location: [facility];  Service: Cardiovascular;  Laterality: N/A;  . PERIPHERAL VASCULAR BALLOON ANGIOPLASTY  12/08/2017   Procedure: PERIPHERAL VASCULAR BALLOON ANGIOPLASTY;  Surgeon: Nigel Mormon, MD;  Location: [facility];  Service: Cardiovascular;;  right common iliac  . PERIPHERAL VASCULAR INTERVENTION  12/08/2017   Procedure: PERIPHERAL VASCULAR INTERVENTION;  Surgeon: Nigel Mormon, MD;  Location: [facility];  Service: Cardiovascular;;  abdominal aorta stent     Social History   Socioeconomic History  . Marital status: Married    Spouse name: Not on file  . Number of children: 3  . Years of education: Not on file  . Highest education level: Not on file  Occupational History  . Not on file  Social Needs  . Financial resource strain: Not on file  . Food insecurity    Worry: Not on file    Inability: Not on file  . Transportation needs    Medical: Not on file    Non-medical: Not on file  Tobacco  Use  . Smoking status: Never Smoker  . Smokeless tobacco: Never Used  Substance and Sexual Activity  . Alcohol use: Never    Frequency: Never  . Drug use: Never  . Sexual activity: Not on file  Lifestyle  . Physical activity    Days per week: Not on file    Minutes per session: Not on file  . Stress: Not on file  Relationships  . Social Herbalist on phone: Not on file    Gets together: Not on file    Attends religious service: Not on file    Active  member of club or organization: Not on file    Attends meetings of clubs or organizations: Not on file    Relationship status: Not on file  . Intimate partner violence    Fear of current or ex partner: Not on file    Emotionally abused: Not on file    Physically abused: Not on file    Forced sexual activity: Not on file  Other Topics Concern  . Not on file  Social History Narrative  . Not on file     Family History  Problem Relation Age of Onset  . Heart failure Mother   . Heart failure Father   . Diabetes Sister   . Congenital heart disease Sister   . Cancer Brother 8       prostate and colon cancer      Current Outpatient Medications on File Prior to Visit  Medication Sig Dispense Refill  . acetaminophen (TYLENOL) 650 MG CR tablet Take 650 mg by mouth every 8 (eight) hours as needed.     Marland Kitchen BIDIL 20-37.5 MG tablet TAKE 1 TABLET BY MOUTH THREE TIMES DAILY 180 tablet 3  . bimatoprost (LUMIGAN) 0.03 % ophthalmic solution 1 drop at bedtime.    . Cholecalciferol (VITAMIN D3) 5000 UNITS CAPS Take 5,000 Units by mouth daily.     . furosemide (LASIX) 40 MG tablet Take 1 tablet (40 mg total) by mouth daily. 90 tablet 3  . Insulin Glargine, 1 Unit Dial, (TOUJEO SOLOSTAR) 300 UNIT/ML SOPN Inject 50 Units into the skin at bedtime. 4.5 mL 2  . metoprolol (LOPRESSOR) 50 MG tablet Take 50 mg by mouth 2 (two) times daily.    . Potassium Chloride ER 20 MEQ TBCR TAKE 1 TABLET BY MOUTH TWICE DAILY 180 tablet 0  . Rivaroxaban (XARELTO) 15 MG TABS tablet Take 15 mg by mouth every evening.     . rosuvastatin (CRESTOR) 20 MG tablet Take 20 mg by mouth at bedtime.    . tamsulosin (FLOMAX) 0.4 MG CAPS capsule daily.  3  . timolol (TIMOPTIC) 0.5 % ophthalmic solution Place 1 drop into both eyes daily.     No current facility-administered medications on file prior to visit.     Cardiovascular studies:  EKG 04/18/2019: Atrial fibrillation with controlled ventricular rate. Old anterior infact.   Nonspecific T wave inversion inferior leads.    Carotid artery duplex  08/30/2018: Mild mixed plaque bilateral carotid arteries without significant stenosis. Antegrade right vertebral artery flow. Left vertebral artery waveform shows high resistance and may suggest distal stenosis.  Antegrade left vertebral artery flow. Overall no significant change from 11/10/2017.  Echocardiogram 08/30/2018: Left ventricle cavity is normal in size. Moderate concentric hypertrophy of the left ventricle. Visual EF is 50-55%. Diastolic function could not be evaluated due to atrial fibrillation.  Left atrial cavity is mildly dilated. Mild aortic valve stenosis. Mild (  Grade I) regurgitation. Aortic valve mean gradient of 9 mmHg, Vmax of 2.0  m/s. Calculated aortic valve area by continuity equation is 1.3 cm. Mild (Grade I) mitral regurgitation. Moderate tricuspid regurgitation. Estimated pulmonary artery systolic pressure 30 mmHg. No significant change compared to previous study on 08/06/2017.   Peripheral angiography and intervention 12/08/2017 & 12/15/2017: Abdominal aorta: Severe ectatic 80% stenosis distal aorta bifurcation-->Successful PTA Rt CIA and covered stent placement Infrarenal abdominal aorta (Viabahn 10.0 X 39 mm covered stent) Bilateral renal arteries: Normal Rt renal artery. Lt renal artery with moderate diffuse disease (limited visualization with CO2) Lt CIA 60% stenosis-->successful PTA with 0% residual stenosis Rt CIA severe 80% restenosis (prior stent)-->successful PTA Mustang 6.0 mm balloon.  Mild restenosis Lt CIA  Lt EIA with distal 95% stenosis, successful drug coated PTA 6.0 X 120 mm In.Pact with 0% residual stenosis Lt common femoral artery 80% stenosis, successful drug coated PTA 6.0 X 120 mm In.Pact with 30% residual stenosis Bilateral SFA: Prox occluded with collaterals seen  Coronary/bnypass graft angiography 08/18/2017: LM: 100% LAD Prox 100%, mid 50%, D1 100% LCx: 100%,  OM2 90% RCA: Prox 100%, distal 100%, PDA 100% LIMA-LAD: Patent SVG-D1 100% SVG-RCA: 100% SVG-OM2: 20%  LE arterial ultrasound 01/13/2018: Final Interpretation: Right: Significant plaque morphology and dampened waveforms in the right lower extremity suggest peripheral vascular disease with possible proximal obstruction. There is no obvious evidence of hemodynamically significant stenosis involving the right  lower extremity arteries.    Left: Significant plaque morphology and dampened waveforms in the left lower extremity suggest peripheral vascular disease with possible proximal obstruction. The superficial femoral artery is occluded in its mid to distal segments with reconstitution at  the popliteal artery.  Lower extremity venous duplex 06/02/2018: No DVT seen  Carotid artery duplex  08/30/2018 Mild mixed plaque bilateral carotid arteries without significant stenosis. Antegrade right vertebral artery flow. Left vertebral artery waveform shows high resistance and may suggest distal stenosis.  Antegrade left vertebral artery flow. Overall no significant change from 11/10/2017.  Recent labs: 01/28/2019: Glucose 272.  BUN/creatinine 23/1.42.  EGFR 53.  Sodium 137, potassium 4.7. H/H 12.4/36.7.  MCV 93.  Platelets 108.   Review of Systems  Constitution: Negative for decreased appetite, malaise/fatigue, weight gain and weight loss.  HENT: Negative for congestion.   Eyes: Negative for visual disturbance.  Cardiovascular: Negative for chest pain, dyspnea on exertion, leg swelling, palpitations and syncope.  Respiratory: Negative for shortness of breath.   Endocrine: Negative for cold intolerance.  Hematologic/Lymphatic: Does not bruise/bleed easily.  Skin: Negative for itching and rash.  Musculoskeletal: Negative for myalgias.       Right hip pain  Gastrointestinal: Negative for abdominal pain, nausea and vomiting.  Genitourinary: Negative for dysuria.  Neurological: Negative for  dizziness and weakness.  Psychiatric/Behavioral: The patient is not nervous/anxious.   All other systems reviewed and are negative.        Vitals:   04/18/19 1405  BP: (!) 169/72  Pulse: 73  Temp: (!) 97.4 F (36.3 C)  SpO2: 98%     Objective:    Physical Exam  Constitutional: He is oriented to person, place, and time. He appears well-developed and well-nourished. No distress.  HENT:  Head: Normocephalic and atraumatic.  Eyes: Pupils are equal, round, and reactive to light. Conjunctivae are normal.  Neck: No JVD present.  Cardiovascular: Normal rate and regular rhythm.  Murmur heard.  Harsh midsystolic murmur is present with a grade of 2/6 at the upper right sternal  border radiating to the neck. Pulses:      Femoral pulses are 1+ on the right side and 1+ on the left side.      Dorsalis pedis pulses are 0 on the right side and 0 on the left side.       Posterior tibial pulses are 1+ on the right side and 1+ on the left side.  Pulmonary/Chest: Effort normal and breath sounds normal. He has no wheezes. He has no rales.  Abdominal: Soft. Bowel sounds are normal. There is no rebound.  Musculoskeletal:        General: No edema.  Lymphadenopathy:    He has no cervical adenopathy.  Neurological: He is alert and oriented to person, place, and time. No cranial nerve deficit.  Skin: Skin is warm and dry.  Psychiatric: He has a normal mood and affect.  Nursing note and vitals reviewed.          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 Annamaria Boots at [facility], bilateral hip osteoarthritis  s/p successful left hip arthroplasty in 04/2018.  Exertional dyspnea, leg edema: Likely mild decompensation of HFpEF after recent hospital admission for pneumonia he was treated with IV fluids and antibiotics.  Increase Lasix to 40 mg twice daily.  Follow-up office visit with echocardiogram in 2 weeks, unless echocardiogram was recently performed during his hospitalization.  CAD: S/p CABG. no angina symptoms at this time. Continue current medical therapy.  Hypertension: Suboptimal control.  Increase Lasix to 40 mg twice daily.    Persistent Afib:  CHA2DS2VAsc score 4, annual stroke risk 5%. Controlled ventricular rate. Continue anticoagulation with Xarelto 15 mg daily.   Mild carotid stenosis: Continue medical maangement  F/u in 2 weeks.   Nigel Mormon, MD [facility] Pager: [phone] Office: [phone] If no answer Cell [phone]

## 2019-05-05 ENCOUNTER — Other Ambulatory Visit: Payer: Medicare Other

## 2019-05-05 ENCOUNTER — Encounter: Payer: Self-pay | Admitting: Cardiology

## 2019-05-05 ENCOUNTER — Other Ambulatory Visit: Payer: Self-pay

## 2019-05-05 ENCOUNTER — Ambulatory Visit (INDEPENDENT_AMBULATORY_CARE_PROVIDER_SITE_OTHER): Payer: Medicare Other | Admitting: Cardiology

## 2019-05-05 VITALS — BP 130/58 | HR 76 | Temp 98.2°F | Ht 67.0 in | Wt 189.2 lb

## 2019-05-05 DIAGNOSIS — I739 Peripheral vascular disease, unspecified: Secondary | ICD-10-CM | POA: Diagnosis not present

## 2019-05-05 DIAGNOSIS — I25810 Atherosclerosis of coronary artery bypass graft(s) without angina pectoris: Secondary | ICD-10-CM

## 2019-05-05 DIAGNOSIS — I340 Nonrheumatic mitral (valve) insufficiency: Secondary | ICD-10-CM | POA: Diagnosis not present

## 2019-05-05 DIAGNOSIS — I350 Nonrheumatic aortic (valve) stenosis: Secondary | ICD-10-CM

## 2019-05-05 MED ORDER — FUROSEMIDE 40 MG PO TABS: 40.0000 mg | ORAL_TABLET | Freq: Two times a day (BID) | ORAL | 3 refills | Status: DC

## 2019-05-05 NOTE — Progress Notes (Signed)
Subjective:   Dustin [name], male    DOB: 10/09/1940, 78 y.o.   MRN: [id number]   Chief complaint:  Aortic stenosis   HPI  78 year old African American male with hypertension, hyperlipidemia, hyperlipidemia, type 2 DM, coronary artery disease s/p CABG 1992, PCI is in 2000, 2001, peripheral artery disease status post staged intervention (infrarenal abdominal aorta covered stent, PTA Rt CIA restensois, PTA Lt EIA) in 11/2017, persistent Afib on Xarelto, OSA on CPAP, CKD 3, GERD, h/o MGUS followed by Dr. 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  Patient was recently admitted to hospital with fever 103 F. He was tested negative for COVID, and was treated for pneumonia with IV antibiotics-levofloxacin.  Since his hospital stay, he has noticed increased swelling in b/l LE.  He is also noticed exertional dyspnea. He was treated with IV fluids during his pneumonia admission. Echocardiogram showed mod to severe mitral regurgitation.  Patient is here with his wife today.  He has clearly had worsening functional capacity and overall deterioration in his health.  He continues to have bilateral leg edema and exertional dyspnea symptoms.  However, his biggest complaint is his reduced mobility due to left hip pain.  He has episodes of waking up middle of night, but denies any orthopnea as the etiology.  At last visit, I increased his lasix to 40 mg twice daily.  Blood pressure is improved today compared to previous visit.  He has lost 3 pounds since his last visit.   Past Medical History:  Diagnosis Date  . A-fib ([place])   . Arthritis    "hips" (12/15/2017)  . CKD (chronic kidney disease), stage III   . High cholesterol   . Hypertension   . OSA on CPAP   . Pneumonia ~ 2017 X1  . PVD (peripheral vascular disease) ([place]) 2008   Infreareanl abdominal aorta covered stent 11/2017. Prior b/l common iliac stents 2008, Lt EIA PTA  11/2017  . Type II diabetes mellitus ([place])      Past Surgical History:  Procedure Laterality Date  . ABDOMINAL AORTOGRAM N/A 12/08/2017   Procedure: ABDOMINAL AORTOGRAM;  Surgeon: Nigel Mormon, MD;  Location: [facility];  Service: Cardiovascular;  Laterality: N/A;  . CARDIAC CATHETERIZATION  1997; 09/2017  . COLONOSCOPY WITH PROPOFOL N/A 07/23/2017   Procedure: COLONOSCOPY WITH PROPOFOL;  Surgeon: Juanita Craver, MD;  Location: [facility];  Service: Endoscopy;  Laterality: N/A;  . CORONARY ANGIOPLASTY WITH STENT PLACEMENT  2008  . CORONARY ARTERY BYPASS GRAFT  1992   "CABG X3"  . LOWER EXTREMITY ANGIOGRAPHY N/A 12/08/2017   Procedure: LOWER EXTREMITY ANGIOGRAPHY;  Surgeon: Nigel Mormon, MD;  Location: [facility];  Service: Cardiovascular;  Laterality: N/A;  . LOWER EXTREMITY ANGIOGRAPHY N/A 12/15/2017   Procedure: LOWER EXTREMITY ANGIOGRAPHY - Left Leg;  Surgeon: Nigel Mormon, MD;  Location: [facility];  Service: Cardiovascular;  Laterality: N/A;  . PERIPHERAL VASCULAR BALLOON ANGIOPLASTY  12/08/2017   Procedure: PERIPHERAL VASCULAR BALLOON ANGIOPLASTY;  Surgeon: Nigel Mormon, MD;  Location: [facility];  Service: Cardiovascular;;  right common iliac  . PERIPHERAL VASCULAR INTERVENTION  12/08/2017   Procedure: PERIPHERAL VASCULAR INTERVENTION;  Surgeon: Nigel Mormon, MD;  Location: [facility];  Service: Cardiovascular;;  abdominal aorta stent     Social History   Socioeconomic History  . Marital status: Married    Spouse name: Not  on file  . Number of children: 3  . Years of education: Not on file  . Highest education level: Not on file  Occupational History  . Not on file  Social Needs  . Financial resource strain: Not on file  . Food insecurity    Worry: Not on file    Inability: Not on file  . Transportation needs    Medical: Not on file    Non-medical: Not on file  Tobacco Use  . Smoking status:  Never Smoker  . Smokeless tobacco: Never Used  Substance and Sexual Activity  . Alcohol use: Never    Frequency: Never  . Drug use: Never  . Sexual activity: Not on file  Lifestyle  . Physical activity    Days per week: Not on file    Minutes per session: Not on file  . Stress: Not on file  Relationships  . Social Herbalist on phone: Not on file    Gets together: Not on file    Attends religious service: Not on file    Active member of club or organization: Not on file    Attends meetings of clubs or organizations: Not on file    Relationship status: Not on file  . Intimate partner violence    Fear of current or ex partner: Not on file    Emotionally abused: Not on file    Physically abused: Not on file    Forced sexual activity: Not on file  Other Topics Concern  . Not on file  Social History Narrative  . Not on file     Family History  Problem Relation Age of Onset  . Heart failure Mother   . Heart failure Father   . Diabetes Sister   . Congenital heart disease Sister   . Cancer Brother 36       prostate and colon cancer      Current Outpatient Medications on File Prior to Visit  Medication Sig Dispense Refill  . acetaminophen (TYLENOL) 650 MG CR tablet Take 650 mg by mouth every 8 (eight) hours as needed.     Marland Kitchen BIDIL 20-37.5 MG tablet TAKE 1 TABLET BY MOUTH THREE TIMES DAILY 180 tablet 3  . bimatoprost (LUMIGAN) 0.03 % ophthalmic solution 1 drop at bedtime.    . Cholecalciferol (VITAMIN D3) 5000 UNITS CAPS Take 5,000 Units by mouth daily.     . furosemide (LASIX) 40 MG tablet Take 1 tablet (40 mg total) by mouth daily. 90 tablet 3  . Insulin Glargine, 1 Unit Dial, (TOUJEO SOLOSTAR) 300 UNIT/ML SOPN Inject 50 Units into the skin at bedtime. 4.5 mL 2  . levofloxacin (LEVAQUIN) 750 MG tablet Take 750 mg by mouth daily.    . metoprolol (LOPRESSOR) 50 MG tablet Take 50 mg by mouth 2 (two) times daily.    . Potassium Chloride ER 20 MEQ TBCR TAKE 1 TABLET  BY MOUTH TWICE DAILY 180 tablet 0  . Rivaroxaban (XARELTO) 15 MG TABS tablet Take 15 mg by mouth every evening.     . rosuvastatin (CRESTOR) 20 MG tablet Take 20 mg by mouth at bedtime.    . tamsulosin (FLOMAX) 0.4 MG CAPS capsule daily.  3  . timolol (TIMOPTIC) 0.5 % ophthalmic solution Place 1 drop into both eyes daily.     No current facility-administered medications on file prior to visit.     Cardiovascular studies:  EKG 05/05/2019:  Atrial fibrillation, controlled ventricular response 75 bpm. IVCD.  Nonspecific ST-T changes.   Outside echocardiogram 04/12/2019: Mod LVH. EF 70%. No comment on diastolic dysfunction. Mod LA dilatation. Mild aortic stenosis, ? bicuspid aortic valve. (Mean PG 17 mmHg, AVA 1.7 cm2) Mod to severe mitral regurgitation.   EKG 04/18/2019: Atrial fibrillation with controlled ventricular rate. Old anterior infact.  Nonspecific T wave inversion inferior leads.    Carotid artery duplex  08/30/2018: Mild mixed plaque bilateral carotid arteries without significant stenosis. Antegrade right vertebral artery flow. Left vertebral artery waveform shows high resistance and may suggest distal stenosis.  Antegrade left vertebral artery flow. Overall no significant change from 11/10/2017.  Echocardiogram 08/30/2018: Left ventricle cavity is normal in size. Moderate concentric hypertrophy of the left ventricle. Visual EF is 50-55%. Diastolic function could not be evaluated due to atrial fibrillation.  Left atrial cavity is mildly dilated. Mild aortic valve stenosis. Mild (Grade I) regurgitation. Aortic valve mean gradient of 9 mmHg, Vmax of 2.0  m/s. Calculated aortic valve area by continuity equation is 1.3 cm. Mild (Grade I) mitral regurgitation. Moderate tricuspid regurgitation. Estimated pulmonary artery systolic pressure 30 mmHg. No significant change compared to previous study on 08/06/2017.   Peripheral angiography and intervention 12/08/2017 &  12/15/2017: Abdominal aorta: Severe ectatic 80% stenosis distal aorta bifurcation-->Successful PTA Rt CIA and covered stent placement Infrarenal abdominal aorta (Viabahn 10.0 X 39 mm covered stent) Bilateral renal arteries: Normal Rt renal artery. Lt renal artery with moderate diffuse disease (limited visualization with CO2) Lt CIA 60% stenosis-->successful PTA with 0% residual stenosis Rt CIA severe 80% restenosis (prior stent)-->successful PTA Mustang 6.0 mm balloon.  Mild restenosis Lt CIA  Lt EIA with distal 95% stenosis, successful drug coated PTA 6.0 X 120 mm In.Pact with 0% residual stenosis Lt common femoral artery 80% stenosis, successful drug coated PTA 6.0 X 120 mm In.Pact with 30% residual stenosis Bilateral SFA: Prox occluded with collaterals seen  Coronary/bnypass graft angiography 08/18/2017: LM: 100% LAD Prox 100%, mid 50%, D1 100% LCx: 100%, OM2 90% RCA: Prox 100%, distal 100%, PDA 100% LIMA-LAD: Patent SVG-D1 100% SVG-RCA: 100% SVG-OM2: 20%  LE arterial ultrasound 01/13/2018: Final Interpretation: Right: Significant plaque morphology and dampened waveforms in the right lower extremity suggest peripheral vascular disease with possible proximal obstruction. There is no obvious evidence of hemodynamically significant stenosis involving the right  lower extremity arteries.    Left: Significant plaque morphology and dampened waveforms in the left lower extremity suggest peripheral vascular disease with possible proximal obstruction. The superficial femoral artery is occluded in its mid to distal segments with reconstitution at  the popliteal artery.  Lower extremity venous duplex 06/02/2018: No DVT seen  Carotid artery duplex  08/30/2018 Mild mixed plaque bilateral carotid arteries without significant stenosis. Antegrade right vertebral artery flow. Left vertebral artery waveform shows high resistance and may suggest distal stenosis.  Antegrade left vertebral artery  flow. Overall no significant change from 11/10/2017.  Recent labs: 01/28/2019: Review of Systems  Constitution: Positive for malaise/fatigue. Negative for decreased appetite, weight gain and weight loss.  HENT: Negative for congestion.   Eyes: Negative for visual disturbance.  Cardiovascular: Positive for dyspnea on exertion and leg swelling. Negative for chest pain, palpitations and syncope.  Respiratory: Positive for shortness of breath. Negative for cough.   Endocrine: Negative for cold intolerance.  Hematologic/Lymphatic: Does not bruise/bleed easily.  Skin: Negative for itching and rash.  Musculoskeletal: Positive for arthritis and joint pain. Negative for myalgias.  Gastrointestinal: Negative for abdominal pain, nausea and vomiting.  Genitourinary: Negative for dysuria.  Neurological: Negative for dizziness and weakness.  Psychiatric/Behavioral: The patient is not nervous/anxious.   All other systems reviewed and are negative.        Vitals:   05/05/19 1402  BP: (!) 130/58  Pulse: 76  Temp: 98.2 F (36.8 C)  SpO2: 97%     Objective:    Physical Exam  Constitutional: He is oriented to person, place, and time. He appears well-developed and well-nourished. No distress.  HENT:  Head: Normocephalic and atraumatic.  Eyes: Pupils are equal, round, and reactive to light. Conjunctivae are normal.  Neck: No JVD present.  Cardiovascular: Normal rate and regular rhythm.  Murmur heard.  Harsh midsystolic murmur is present with a grade of 2/6 at the upper right sternal border radiating to the neck. Pulses:      Femoral pulses are 1+ on the right side and 1+ on the left side.      Dorsalis pedis pulses are 0 on the right side and 0 on the left side.       Posterior tibial pulses are 1+ on the right side and 1+ on the left side.  Pulmonary/Chest: Effort normal and breath sounds normal. He has no wheezes. He has no rales.  Abdominal: Soft. Bowel sounds are normal. There is no  rebound.  Musculoskeletal:        General: Edema (1-2+ b/l) present.  Lymphadenopathy:    He has no cervical adenopathy.  Neurological: He is alert and oriented to person, place, and time. No cranial nerve deficit.  Skin: Skin is warm and dry.  Psychiatric: He has a normal mood and affect.  Nursing note and vitals reviewed.          Assessment & Recommendations:   78 year old [place] American male with hypertension, hyperlipidemia, hyperlipidemia, type 2 DM, coronary artery disease s/p CABG 1992, PCI is in 2000, 2001, moderate to severe MR, PAD s/p prior iliac stents, persistent Afib on Xarelto, OSA on CPAP, CKD 3, GERD, h/o MGUS followed by Dr. 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   Exertional dyspnea, leg edema: Mitral regurgitation noted to be moderate to severe on hospital echocardiogram in [place] in 03/2019.  He certainly symptomatic with exertional dyspnea and leg edema.  I had a long conversation with patient and his wife regarding management options. I gave him options of further work-up with TEE and consideration for mitral clip in future.  However, both patient and wife are leaning towards more conservative approach with symptomatic management with medications.   I have increased his Lasix to 40 mg twice daily at this time.  I will see him back in 4 weeks.  Encouraged him to check basic metabolic panel with his PCP in the meantime.  Wilder Glade could be consideration given his diabetes and heart failure with preserved ejection fraction.  However, his renal function has been variable, which may limit use of [place].  CAD: S/p CABG. no angina symptoms at this time. Continue current medical therapy.  Hypertension: Well-controlled.  Persistent Afib:  CHA2DS2VAsc score 4, annual stroke risk 5%. Controlled ventricular rate. Continue anticoagulation with Xarelto 15 mg daily.   Mild carotid stenosis:  Continue medical maangement  F/u in 2 weeks.   Nigel Mormon, MD [facility] Pager: [phone] Office: [phone] If no answer Cell [phone]

## 2019-05-09 ENCOUNTER — Telehealth: Payer: Self-pay

## 2019-05-09 DIAGNOSIS — I340 Nonrheumatic mitral (valve) insufficiency: Secondary | ICD-10-CM

## 2019-05-10 NOTE — Telephone Encounter (Signed)
Echo in Dec

## 2019-05-10 NOTE — Telephone Encounter (Signed)
I had a long conversation with the patient's daughter Audelia Acton and wife. At this time, they would prefer conservative medical management. Continue lasix. Repeat transthoracic echocardiogram, preferably on 12/7 morning-before his appt. Please leave at least 2 hour space between echo and OV.   Thanks MJP

## 2019-05-16 ENCOUNTER — Ambulatory Visit: Payer: Medicare Other | Admitting: Cardiology

## 2019-06-06 ENCOUNTER — Ambulatory Visit (INDEPENDENT_AMBULATORY_CARE_PROVIDER_SITE_OTHER): Payer: Medicare Other

## 2019-06-06 ENCOUNTER — Encounter: Payer: Self-pay | Admitting: Cardiology

## 2019-06-06 ENCOUNTER — Other Ambulatory Visit: Payer: Self-pay

## 2019-06-06 ENCOUNTER — Ambulatory Visit (INDEPENDENT_AMBULATORY_CARE_PROVIDER_SITE_OTHER): Payer: Medicare Other | Admitting: Cardiology

## 2019-06-06 VITALS — BP 154/82 | HR 85 | Temp 98.0°F | Ht 67.0 in | Wt 180.2 lb

## 2019-06-06 DIAGNOSIS — I25810 Atherosclerosis of coronary artery bypass graft(s) without angina pectoris: Secondary | ICD-10-CM

## 2019-06-06 DIAGNOSIS — I340 Nonrheumatic mitral (valve) insufficiency: Secondary | ICD-10-CM | POA: Diagnosis not present

## 2019-06-06 DIAGNOSIS — R600 Localized edema: Secondary | ICD-10-CM

## 2019-06-06 DIAGNOSIS — I739 Peripheral vascular disease, unspecified: Secondary | ICD-10-CM

## 2019-06-06 DIAGNOSIS — I4819 Other persistent atrial fibrillation: Secondary | ICD-10-CM

## 2019-06-06 NOTE — Progress Notes (Signed)
Subjective:   Dustin [name], male    DOB: 08/09/1940, 78 y.o.   MRN: [id number]   Chief complaint:  Aortic stenosis   HPI  78 year old African American male with hypertension, hyperlipidemia, hyperlipidemia, type 2 DM, coronary artery disease s/p CABG 1992, PCI is in 2000, 2001, moderate MR, PAD s/p prior iliac stents, persistent Afib on Xarelto, OSA on CPAP, CKD 3, GERD, h/o MGUS followed by Dr. Annamaria Boots at [place] [facility], bilateral hip osteoarthritis s/p successful left hip arthroplasty in 04/2018.   He has noted improvement in in his shortness of breath and leg edema on lasix 40 mg bid. Echocardiogram from today discussed with the patient.   Past Medical History:  Diagnosis Date  . A-fib ([place])   . Arthritis    "hips" (12/15/2017)  . CKD (chronic kidney disease), stage III   . High cholesterol   . Hypertension   . OSA on CPAP   . Pneumonia ~ 2017 X1  . PVD (peripheral vascular disease) ([place]) 2008   Infreareanl abdominal aorta covered stent 11/2017. Prior b/l common iliac stents 2008, Lt EIA PTA 11/2017  . Type II diabetes mellitus ([place])      Past Surgical History:  Procedure Laterality Date  . ABDOMINAL AORTOGRAM N/A 12/08/2017   Procedure: ABDOMINAL AORTOGRAM;  Surgeon: Nigel Mormon, MD;  Location: [facility];  Service: Cardiovascular;  Laterality: N/A;  . CARDIAC CATHETERIZATION  1997; 09/2017  . COLONOSCOPY WITH PROPOFOL N/A 07/23/2017   Procedure: COLONOSCOPY WITH PROPOFOL;  Surgeon: Juanita Craver, MD;  Location: [facility];  Service: Endoscopy;  Laterality: N/A;  . CORONARY ANGIOPLASTY WITH STENT PLACEMENT  2008  . CORONARY ARTERY BYPASS GRAFT  1992   "CABG X3"  . LOWER EXTREMITY ANGIOGRAPHY N/A 12/08/2017   Procedure: LOWER EXTREMITY ANGIOGRAPHY;  Surgeon: Nigel Mormon, MD;  Location: [facility];  Service: Cardiovascular;  Laterality: N/A;  . LOWER EXTREMITY ANGIOGRAPHY N/A 12/15/2017   Procedure: LOWER EXTREMITY  ANGIOGRAPHY - Left Leg;  Surgeon: Nigel Mormon, MD;  Location: [facility];  Service: Cardiovascular;  Laterality: N/A;  . PERIPHERAL VASCULAR BALLOON ANGIOPLASTY  12/08/2017   Procedure: PERIPHERAL VASCULAR BALLOON ANGIOPLASTY;  Surgeon: Nigel Mormon, MD;  Location: [facility];  Service: Cardiovascular;;  right common iliac  . PERIPHERAL VASCULAR INTERVENTION  12/08/2017   Procedure: PERIPHERAL VASCULAR INTERVENTION;  Surgeon: Nigel Mormon, MD;  Location: [facility];  Service: Cardiovascular;;  abdominal aorta stent     Social History   Socioeconomic History  . Marital status: Married    Spouse name: Not on file  . Number of children: 3  . Years of education: Not on file  . Highest education level: Not on file  Occupational History  . Not on file  Social Needs  . Financial resource strain: Not on file  . Food insecurity    Worry: Not on file    Inability: Not on file  . Transportation needs    Medical: Not on file    Non-medical: Not on file  Tobacco Use  . Smoking status: Never Smoker  . Smokeless tobacco: Never Used  Substance and Sexual Activity  . Alcohol use: Never    Frequency: Never  . Drug use: Never  . Sexual activity: Not on file  Lifestyle  . Physical activity    Days per week: Not on file    Minutes per session: Not on file  .  Stress: Not on file  Relationships  . Social Herbalist on phone: Not on file    Gets together: Not on file    Attends religious service: Not on file    Active member of club or organization: Not on file    Attends meetings of clubs or organizations: Not on file    Relationship status: Not on file  . Intimate partner violence    Fear of current or ex partner: Not on file    Emotionally abused: Not on file    Physically abused: Not on file    Forced sexual activity: Not on file  Other Topics Concern  . Not on file  Social History Narrative  . Not on file     Family  History  Problem Relation Age of Onset  . Heart failure Mother   . Heart failure Father   . Diabetes Sister   . Congenital heart disease Sister   . Cancer Brother 62       prostate and colon cancer      Current Outpatient Medications on File Prior to Visit  Medication Sig Dispense Refill  . acetaminophen (TYLENOL) 650 MG CR tablet Take 650 mg by mouth every 8 (eight) hours as needed.     Marland Kitchen BIDIL 20-37.5 MG tablet TAKE 1 TABLET BY MOUTH THREE TIMES DAILY 180 tablet 3  . bimatoprost (LUMIGAN) 0.03 % ophthalmic solution 1 drop at bedtime.    . Cholecalciferol (VITAMIN D3) 5000 UNITS CAPS Take 5,000 Units by mouth daily.     . furosemide (LASIX) 40 MG tablet Take 1 tablet (40 mg total) by mouth 2 (two) times daily. 60 tablet 3  . Insulin Glargine, 1 Unit Dial, (TOUJEO SOLOSTAR) 300 UNIT/ML SOPN Inject 50 Units into the skin at bedtime. 4.5 mL 2  . metoprolol (LOPRESSOR) 50 MG tablet Take 50 mg by mouth 2 (two) times daily.    . Potassium Chloride ER 20 MEQ TBCR TAKE 1 TABLET BY MOUTH TWICE DAILY 180 tablet 0  . Rivaroxaban (XARELTO) 15 MG TABS tablet Take 15 mg by mouth every evening.     . rosuvastatin (CRESTOR) 20 MG tablet Take 20 mg by mouth at bedtime.    . tamsulosin (FLOMAX) 0.4 MG CAPS capsule daily.  3  . timolol (TIMOPTIC) 0.5 % ophthalmic solution Place 1 drop into both eyes daily.    Marland Kitchen levofloxacin (LEVAQUIN) 750 MG tablet Take 750 mg by mouth daily.     No current facility-administered medications on file prior to visit.     Cardiovascular studies:  Echocardiogram 06/06/2019: Left ventricle cavity is normal in size. Moderate concentric hypertrophy of the left ventricle. Normal LV systolic function with visual EF 50-55%. Abnormal septal wall motion due to post-operative coronary artery bypass graft. Unable to evaluate diastolic function due to atrial fibrillation.  Left atrial cavity is moderately dilated. Trileaflet aortic valve with mild calcification. Aortic valve mean  gradient of 12 mmHg, Vmax of 2.3 m/s. Calculated aortic valve area by continuity equation is 1.2 cm. No regurgitation noted. Moderate (Grade II) mitral regurgitation. Mild tricuspid regurgitation. Estimated pulmonary artery systolic pressure is 31 mmHg.  No significant change compared to previous study on 08/30/2018.  EKG 05/05/2019:  Atrial fibrillation, controlled ventricular response 75 bpm. IVCD. Nonspecific ST-T changes.   Carotid artery duplex  08/30/2018: Mild mixed plaque bilateral carotid arteries without significant stenosis. Antegrade right vertebral artery flow. Left vertebral artery waveform shows high resistance and may suggest distal stenosis.  Antegrade left vertebral artery flow. Overall no significant change from 11/10/2017.  Peripheral angiography and intervention 12/08/2017 & 12/15/2017: Abdominal aorta: Severe ectatic 80% stenosis distal aorta bifurcation-->Successful PTA Rt CIA and covered stent placement Infrarenal abdominal aorta (Viabahn 10.0 X 39 mm covered stent) Bilateral renal arteries: Normal Rt renal artery. Lt renal artery with moderate diffuse disease (limited visualization with CO2) Lt CIA 60% stenosis-->successful PTA with 0% residual stenosis Rt CIA severe 80% restenosis (prior stent)-->successful PTA Mustang 6.0 mm balloon.  Mild restenosis Lt CIA  Lt EIA with distal 95% stenosis, successful drug coated PTA 6.0 X 120 mm In.Pact with 0% residual stenosis Lt common femoral artery 80% stenosis, successful drug coated PTA 6.0 X 120 mm In.Pact with 30% residual stenosis Bilateral SFA: Prox occluded with collaterals seen  Coronary/bnypass graft angiography 08/18/2017: LM: 100% LAD Prox 100%, mid 50%, D1 100% LCx: 100%, OM2 90% RCA: Prox 100%, distal 100%, PDA 100% LIMA-LAD: Patent SVG-D1 100% SVG-RCA: 100% SVG-OM2: 20%  LE arterial ultrasound 01/13/2018: Final Interpretation: Right: Significant plaque morphology and dampened waveforms in the right  lower extremity suggest peripheral vascular disease with possible proximal obstruction. There is no obvious evidence of hemodynamically significant stenosis involving the right  lower extremity arteries.    Left: Significant plaque morphology and dampened waveforms in the left lower extremity suggest peripheral vascular disease with possible proximal obstruction. The superficial femoral artery is occluded in its mid to distal segments with reconstitution at  the popliteal artery.  Lower extremity venous duplex 06/02/2018: No DVT seen  Carotid artery duplex  08/30/2018 Mild mixed plaque bilateral carotid arteries without significant stenosis. Antegrade right vertebral artery flow. Left vertebral artery waveform shows high resistance and may suggest distal stenosis.  Antegrade left vertebral artery flow. Overall no significant change from 11/10/2017.  Recent labs: 01/28/2019: Glucose 272, BUN/Cr 23/1.42. EGFR 53. Na/K 137/4.7. Rest of the CMP normal H/H 12.5/36.7. MCV 93.4. Platelets 108     Review of Systems  Constitution: Negative for decreased appetite, malaise/fatigue, weight gain and weight loss.  HENT: Negative for congestion.   Eyes: Negative for visual disturbance.  Cardiovascular: Positive for dyspnea on exertion (Improved) and leg swelling (Improved). Negative for chest pain, palpitations and syncope.  Respiratory: Negative for cough.   Endocrine: Negative for cold intolerance.  Hematologic/Lymphatic: Does not bruise/bleed easily.  Skin: Negative for itching and rash.  Musculoskeletal: Positive for joint pain. Negative for myalgias.  Gastrointestinal: Negative for abdominal pain, nausea and vomiting.  Genitourinary: Negative for dysuria.  Neurological: Negative for dizziness and weakness.  Psychiatric/Behavioral: The patient is not nervous/anxious.   All other systems reviewed and are negative.    Vitals:   06/06/19 1359  BP: (!) 154/82  Pulse: 85  Temp: 98 F (36.7  C)  SpO2: 99%     Objective:    Physical Exam  Constitutional: He is oriented to person, place, and time. He appears well-developed and well-nourished. No distress.  HENT:  Head: Normocephalic and atraumatic.  Eyes: Pupils are equal, round, and reactive to light. Conjunctivae are normal.  Neck: No JVD present.  Cardiovascular: Normal rate and regular rhythm.  Murmur heard.  Harsh midsystolic murmur is present with a grade of 2/6 at the upper right sternal border radiating to the neck. Pulses:      Femoral pulses are 1+ on the right side and 1+ on the left side.      Dorsalis pedis pulses are 0 on the right side and 0 on the left side.  Posterior tibial pulses are 1+ on the right side and 1+ on the left side.  Pulmonary/Chest: Effort normal and breath sounds normal. He has no wheezes. He has no rales.  Abdominal: Soft. Bowel sounds are normal. There is no rebound.  Musculoskeletal:        General: Edema (1-2+ b/l) present.  Lymphadenopathy:    He has no cervical adenopathy.  Neurological: He is alert and oriented to person, place, and time. No cranial nerve deficit.  Skin: Skin is warm and dry.  Psychiatric: He has a normal mood and affect.  Nursing note and vitals reviewed.          Assessment & Recommendations:   78 year old [place] American male with hypertension, hyperlipidemia, hyperlipidemia, type 2 DM, coronary artery disease s/p CABG 1992, PCI is in 2000, 2001, moderate MR, PAD s/p prior iliac stents, persistent Afib on Xarelto, OSA on CPAP, CKD 3, GERD, h/o MGUS followed by Dr. 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   Exertional dyspnea, leg edema: Improved on lasix 40 mg bid. Mitral regurgitation appears moderate on echocardiogram today. Continue conservative medical therapy.   CAD: S/p CABG. no angina symptoms at this time. Continue current medical therapy.  Hypertension:  Well-controlled.  Persistent Afib:  CHA2DS2VAsc score 4, annual stroke risk 5%. Controlled ventricular rate. Continue anticoagulation with Xarelto 15 mg daily.   Mild carotid stenosis: Continue medical maangement  F/u in 3 months  [place], MD [facility] Pager: [phone] Office: [phone] If no answer Cell [phone]

## 2019-06-29 ENCOUNTER — Other Ambulatory Visit: Payer: Self-pay | Admitting: Cardiology

## 2019-07-31 ENCOUNTER — Other Ambulatory Visit: Payer: Self-pay | Admitting: Cardiology

## 2019-09-05 ENCOUNTER — Ambulatory Visit: Payer: Medicare Other | Admitting: Cardiology

## 2019-09-10 ENCOUNTER — Other Ambulatory Visit: Payer: Self-pay | Admitting: Cardiology

## 2019-09-22 ENCOUNTER — Encounter: Payer: Self-pay | Admitting: Cardiology

## 2019-09-22 ENCOUNTER — Ambulatory Visit: Payer: Medicare Other | Admitting: Cardiology

## 2019-09-22 ENCOUNTER — Other Ambulatory Visit: Payer: Self-pay

## 2019-09-22 VITALS — BP 123/65 | HR 60 | Temp 98.4°F | Ht 67.0 in | Wt 189.0 lb

## 2019-09-22 DIAGNOSIS — I350 Nonrheumatic aortic (valve) stenosis: Secondary | ICD-10-CM

## 2019-09-22 DIAGNOSIS — I4819 Other persistent atrial fibrillation: Secondary | ICD-10-CM

## 2019-09-22 DIAGNOSIS — I340 Nonrheumatic mitral (valve) insufficiency: Secondary | ICD-10-CM

## 2019-09-22 DIAGNOSIS — I739 Peripheral vascular disease, unspecified: Secondary | ICD-10-CM

## 2019-09-22 NOTE — Progress Notes (Signed)
Subjective:   Dustin [name], male    DOB: 19-Jul-1940, 79 y.o.   MRN: [id number]   Chief complaint:  Aortic stenosis   HPI  79 year old African American male with hypertension, hyperlipidemia, hyperlipidemia, type 2 DM, coronary artery disease s/p CABG 1992, PCI is in 2000, 2001, moderate MR, PAD s/p prior iliac stents, persistent Afib on Xarelto, OSA on CPAP, CKD 3, GERD, h/o MGUS followed by Dr. 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   Exertional dyspnea remains mild and stable. Blood pressure is controlled. Leg edema has resolved. He is compliant with his medical therapy.    Current Outpatient Medications on File Prior to Visit  Medication Sig Dispense Refill  . acetaminophen (TYLENOL) 650 MG CR tablet Take 650 mg by mouth every 8 (eight) hours as needed.     Marland Kitchen BIDIL 20-37.5 MG tablet TAKE 1 TABLET BY MOUTH THREE TIMES DAILY 180 tablet 3  . bimatoprost (LUMIGAN) 0.03 % ophthalmic solution 1 drop at bedtime.    . Cholecalciferol (VITAMIN D3) 5000 UNITS CAPS Take 5,000 Units by mouth daily.     . furosemide (LASIX) 40 MG tablet Take 1 tablet (40 mg total) by mouth 2 (two) times daily. 60 tablet 3  . Insulin Glargine, 1 Unit Dial, (TOUJEO SOLOSTAR) 300 UNIT/ML SOPN Inject 50 Units into the skin at bedtime. 4.5 mL 2  . levofloxacin (LEVAQUIN) 750 MG tablet Take 750 mg by mouth daily.    . metoprolol (LOPRESSOR) 50 MG tablet Take 50 mg by mouth 2 (two) times daily.    . Potassium Chloride ER 20 MEQ TBCR TAKE 1 TABLET BY MOUTH TWICE DAILY 60 tablet 3  . Rivaroxaban (XARELTO) 15 MG TABS tablet Take 15 mg by mouth every evening.     . rosuvastatin (CRESTOR) 20 MG tablet TAKE 1 TABLET BY MOUTH EVERY DAY 30 tablet 3  . tamsulosin (FLOMAX) 0.4 MG CAPS capsule daily.  3  . timolol (TIMOPTIC) 0.5 % ophthalmic solution Place 1 drop into both eyes daily.     No current facility-administered medications on file prior  to visit.    Cardiovascular studies:  Echocardiogram 06/06/2019: Left ventricle cavity is normal in size. Moderate concentric hypertrophy of the left ventricle. Normal LV systolic function with visual EF 50-55%. Abnormal septal wall motion due to post-operative coronary artery bypass graft. Unable to evaluate diastolic function due to atrial fibrillation.  Left atrial cavity is moderately dilated. Trileaflet aortic valve with mild calcification. Aortic valve mean gradient of 12 mmHg, Vmax of 2.3 m/s. Calculated aortic valve area by continuity equation is 1.2 cm. No regurgitation noted. Moderate (Grade II) mitral regurgitation. Mild tricuspid regurgitation. Estimated pulmonary artery systolic pressure is 31 mmHg.  No significant change compared to previous study on 08/30/2018.  EKG 05/05/2019:  Atrial fibrillation, controlled ventricular response 75 bpm. IVCD. Nonspecific ST-T changes.   Carotid artery duplex  08/30/2018: Mild mixed plaque bilateral carotid arteries without significant stenosis. Antegrade right vertebral artery flow. Left vertebral artery waveform shows high resistance and may suggest distal stenosis.  Antegrade left vertebral artery flow. Overall no significant change from 11/10/2017.  Peripheral angiography and intervention 12/08/2017 & 12/15/2017: Abdominal aorta: Severe ectatic 80% stenosis distal aorta bifurcation-->Successful PTA Rt CIA and covered stent placement Infrarenal abdominal aorta (Viabahn 10.0 X 39 mm covered stent) Bilateral renal arteries: Normal Rt renal artery. Lt renal artery with moderate diffuse disease (limited visualization with CO2) Lt CIA 60%  stenosis-->successful PTA with 0% residual stenosis Rt CIA severe 80% restenosis (prior stent)-->successful PTA Mustang 6.0 mm balloon.  Mild restenosis Lt CIA  Lt EIA with distal 95% stenosis, successful drug coated PTA 6.0 X 120 mm In.Pact with 0% residual stenosis Lt common femoral artery 80% stenosis,  successful drug coated PTA 6.0 X 120 mm In.Pact with 30% residual stenosis Bilateral SFA: Prox occluded with collaterals seen  Coronary/bnypass graft angiography 08/18/2017: LM: 100% LAD Prox 100%, mid 50%, D1 100% LCx: 100%, OM2 90% RCA: Prox 100%, distal 100%, PDA 100% LIMA-LAD: Patent SVG-D1 100% SVG-RCA: 100% SVG-OM2: 20%  LE arterial ultrasound 01/13/2018: Final Interpretation: Right: Significant plaque morphology and dampened waveforms in the right lower extremity suggest peripheral vascular disease with possible proximal obstruction. There is no obvious evidence of hemodynamically significant stenosis involving the right  lower extremity arteries.    Left: Significant plaque morphology and dampened waveforms in the left lower extremity suggest peripheral vascular disease with possible proximal obstruction. The superficial femoral artery is occluded in its mid to distal segments with reconstitution at  the popliteal artery.  Lower extremity venous duplex 06/02/2018: No DVT seen  Carotid artery duplex  08/30/2018 Mild mixed plaque bilateral carotid arteries without significant stenosis. Antegrade right vertebral artery flow. Left vertebral artery waveform shows high resistance and may suggest distal stenosis.  Antegrade left vertebral artery flow. Overall no significant change from 11/10/2017.  Recent labs: 01/28/2019: Glucose 272, BUN/Cr 23/1.42. EGFR 53. Na/K 137/4.7. Rest of the CMP normal H/H 12.5/36.7. MCV 93.4. Platelets 108     Review of Systems  Cardiovascular: Positive for dyspnea on exertion (Improved) and leg swelling (Improved). Negative for chest pain, palpitations and syncope.  Hematologic/Lymphatic: Does not bruise/bleed easily.  Musculoskeletal: Positive for joint pain.  All other systems reviewed and are negative.    Vitals:   09/22/19 1434 09/22/19 1442  BP: (!) 156/66 123/65  Pulse: 65 60  Temp: 98.4 F (36.9 C) 98.4 F (36.9 C)  SpO2: 97%       Objective:    Physical Exam  Constitutional: No distress.  Neck: No JVD present.  Cardiovascular: Normal rate and regular rhythm.  Murmur heard.  Harsh midsystolic murmur is present with a grade of 2/6 at the upper right sternal border radiating to the neck. Pulses:      Femoral pulses are 1+ on the right side and 1+ on the left side.      Dorsalis pedis pulses are 0 on the right side and 0 on the left side.       Posterior tibial pulses are 1+ on the right side and 1+ on the left side.  Pulmonary/Chest: Effort normal and breath sounds normal. He has no wheezes. He has no rales.  Musculoskeletal:        General: No edema.  Psychiatric: He has a normal mood and affect.  Nursing note and vitals reviewed.          Assessment & Recommendations:   79 year old [place] American male with hypertension, hyperlipidemia, hyperlipidemia, type 2 DM, coronary artery disease s/p CABG 1992, PCI is in 2000, 2001, moderate MR, PAD s/p prior iliac stents, persistent Afib on Xarelto, OSA on CPAP, CKD 3, GERD, h/o MGUS followed by Dr. 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   Exertional dyspnea, leg edema: Controlled on lasix 40 mg bid. Mitral regurgitation appears moderate on echocardiogram in 05/2019. Continue conservative medical therapy. Patient does not want  to proceed with any procedures.   CAD: S/p CABG. no angina symptoms at this time. Continue current medical therapy.  Hypertension: Well-controlled.  Persistent Afib:  CHA2DS2VAsc score 4, annual stroke risk 5%. Controlled ventricular rate. Continue anticoagulation with Xarelto 15 mg daily.   Mild carotid stenosis: Continue medical maangement  Will get labs from PCP. F/u in 6 months  Judea Fennimore Esther Hardy, MD [facility] Pager: [phone] Office: [phone] If no answer Cell [phone]

## 2019-09-23 ENCOUNTER — Encounter: Payer: Self-pay | Admitting: Cardiology

## 2019-11-04 ENCOUNTER — Other Ambulatory Visit: Payer: Self-pay | Admitting: Cardiology

## 2019-11-17 ENCOUNTER — Other Ambulatory Visit: Payer: Self-pay | Admitting: Cardiology

## 2019-11-17 DIAGNOSIS — I340 Nonrheumatic mitral (valve) insufficiency: Secondary | ICD-10-CM

## 2019-12-08 ENCOUNTER — Other Ambulatory Visit: Payer: Medicare Other

## 2019-12-08 ENCOUNTER — Ambulatory Visit: Payer: Medicare Other | Admitting: Cardiology

## 2020-01-17 ENCOUNTER — Telehealth: Payer: Self-pay

## 2020-01-17 NOTE — Telephone Encounter (Signed)
Pt called to ask if it was ok for pt to some eye surgery on Aug. Please advise.

## 2020-01-17 NOTE — Telephone Encounter (Signed)
Yes.  I do not see any contraindication.  I will send preop letter.  Please get information regarding the surgeon etc.  Thanks MJP

## 2020-01-19 NOTE — Telephone Encounter (Signed)
Called pt to ask him about more info. The surgeon is Dr[facility] Marcello Mo[facility]. The surgery will be the first week of Aug and its on his RT eye.

## 2020-03-02 ENCOUNTER — Ambulatory Visit: Payer: Medicare Other | Admitting: Cardiology

## 2020-03-02 ENCOUNTER — Other Ambulatory Visit: Payer: Medicare Other

## 2020-03-14 ENCOUNTER — Other Ambulatory Visit: Payer: Self-pay

## 2020-03-14 ENCOUNTER — Ambulatory Visit: Payer: Medicare Other

## 2020-03-14 DIAGNOSIS — I340 Nonrheumatic mitral (valve) insufficiency: Secondary | ICD-10-CM

## 2020-03-19 ENCOUNTER — Other Ambulatory Visit: Payer: Self-pay

## 2020-03-19 ENCOUNTER — Encounter: Payer: Self-pay | Admitting: Cardiology

## 2020-03-19 ENCOUNTER — Ambulatory Visit: Payer: Medicare Other | Admitting: Cardiology

## 2020-03-19 VITALS — BP 135/69 | HR 61 | Resp 16 | Ht 67.0 in | Wt 188.0 lb

## 2020-03-19 DIAGNOSIS — I739 Peripheral vascular disease, unspecified: Secondary | ICD-10-CM

## 2020-03-19 DIAGNOSIS — I4819 Other persistent atrial fibrillation: Secondary | ICD-10-CM

## 2020-03-19 DIAGNOSIS — I340 Nonrheumatic mitral (valve) insufficiency: Secondary | ICD-10-CM

## 2020-03-19 DIAGNOSIS — I350 Nonrheumatic aortic (valve) stenosis: Secondary | ICD-10-CM

## 2020-03-19 DIAGNOSIS — I25810 Atherosclerosis of coronary artery bypass graft(s) without angina pectoris: Secondary | ICD-10-CM

## 2020-03-19 MED ORDER — FUROSEMIDE 40 MG PO TABS: 40.0000 mg | ORAL_TABLET | Freq: Every day | ORAL | 1 refills | Status: DC

## 2020-03-19 NOTE — Progress Notes (Signed)
Subjective:   Dustin [name], male    DOB: 14-Oct-1940, 79 y.o.   MRN: [id number]   Chief complaint:  Aortic stenosis   HPI  79 year old African American male with hypertension, hyperlipidemia, hyperlipidemia, type 2 DM, coronary artery disease s/p CABG 1992, PCI is in 2000, 2001, moderate MR, PAD s/p prior iliac stents, persistent Afib on Xarelto, OSA on CPAP, CKD 3, GERD, h/o MGUS followed by Dr. Annamaria Boots at [facility], bilateral hip osteoarthritis s/p successful left hip arthroplasty in 04/2018.   Patient is doing well, denies chest pain. He has stable, unchanged, mild exertional dyspnea. Denies leg edema. He is being started on Farxiga by his endocrinologist.    Current Outpatient Medications on File Prior to Visit  Medication Sig Dispense Refill  . acetaminophen (TYLENOL) 650 MG CR tablet Take 650 mg by mouth every 8 (eight) hours as needed.     Marland Kitchen BIDIL 20-37.5 MG tablet TAKE 1 TABLET BY MOUTH THREE TIMES DAILY 180 tablet 3  . bimatoprost (LUMIGAN) 0.03 % ophthalmic solution 1 drop at bedtime.    . Cholecalciferol (VITAMIN D3) 5000 UNITS CAPS Take 5,000 Units by mouth daily.     . furosemide (LASIX) 40 MG tablet TAKE 1 TABLET(40 MG) BY MOUTH TWICE DAILY 180 tablet 1  . Insulin Glargine, 1 Unit Dial, (TOUJEO SOLOSTAR) 300 UNIT/ML SOPN Inject 50 Units into the skin at bedtime. 4.5 mL 2  . Magnesium 250 MG TABS Take 1 tablet by mouth in the morning and at bedtime.    . metoprolol (LOPRESSOR) 50 MG tablet Take 50 mg by mouth 2 (two) times daily.    . Potassium Chloride ER 20 MEQ TBCR TAKE 1 TABLET BY MOUTH TWICE DAILY 60 tablet 3  . Rivaroxaban (XARELTO) 15 MG TABS tablet Take 15 mg by mouth every evening.     . rosuvastatin (CRESTOR) 20 MG tablet TAKE 1 TABLET BY MOUTH EVERY DAY 30 tablet 3  . tamsulosin (FLOMAX) 0.4 MG CAPS capsule daily.  3  . timolol (TIMOPTIC) 0.5 % ophthalmic solution Place 1 drop into both eyes daily.     No current facility-administered  medications on file prior to visit.    Cardiovascular studies:  EKG 03/19/2020: Atrial fibrillation 59 bpm  Old anterior infarct  Echocardiogram 03/14/2020:  Left ventricle cavity is normal in size. Moderate concentric hypertrophy  of the left ventricle. Abnormal septal wall motion due to post-operative  coronary artery bypass graft. Normal LV systolic function with visual EF  50-55%. Unable to evaluate diastolic function due to atrial fibrillation.  Left atrial cavity is severely dilated.  Trileaflet aortic valve with moderate calficiation. Moderate aortic  stenosis. Aortic valve mean gradient of 13 mmHg, Vmax of 2.5 m/s.  Calculated aortic valve area by continuity equation is 1 cm. DVI 0.33.  Mild (Grade I) aortic regurgitation.  Moderate (Grade II) mitral regurgitation. Mildly restricted mitral valve  leaflets.  Mild tricuspid regurgitation. Estimated pulmonary artery systolic pressure  30 mmHg.  No significant change compared to previous study on 06/06/2019.  Carotid artery duplex  08/30/2018: Mild mixed plaque bilateral carotid arteries without significant stenosis. Antegrade right vertebral artery flow. Left vertebral artery waveform shows high resistance and may suggest distal stenosis.  Antegrade left vertebral artery flow. Overall no significant change from 11/10/2017.  Peripheral angiography and intervention 12/08/2017 & 12/15/2017: Abdominal aorta: Severe ectatic 80% stenosis distal aorta bifurcation-->Successful PTA Rt CIA and covered stent placement Infrarenal abdominal aorta (Viabahn 10.0 X 39 mm  covered stent) Bilateral renal arteries: Normal Rt renal artery. Lt renal artery with moderate diffuse disease (limited visualization with CO2) Lt CIA 60% stenosis-->successful PTA with 0% residual stenosis Rt CIA severe 80% restenosis (prior stent)-->successful PTA Mustang 6.0 mm balloon.  Mild restenosis Lt CIA  Lt EIA with distal 95% stenosis, successful drug coated PTA  6.0 X 120 mm In.Pact with 0% residual stenosis Lt common femoral artery 80% stenosis, successful drug coated PTA 6.0 X 120 mm In.Pact with 30% residual stenosis Bilateral SFA: Prox occluded with collaterals seen  Coronary/bnypass graft angiography 08/18/2017: LM: 100% LAD Prox 100%, mid 50%, D1 100% LCx: 100%, OM2 90% RCA: Prox 100%, distal 100%, PDA 100% LIMA-LAD: Patent SVG-D1 100% SVG-RCA: 100% SVG-OM2: 20%  LE arterial ultrasound 01/13/2018: Final Interpretation: Right: Significant plaque morphology and dampened waveforms in the right lower extremity suggest peripheral vascular disease with possible proximal obstruction. There is no obvious evidence of hemodynamically significant stenosis involving the right  lower extremity arteries.    Left: Significant plaque morphology and dampened waveforms in the left lower extremity suggest peripheral vascular disease with possible proximal obstruction. The superficial femoral artery is occluded in its mid to distal segments with reconstitution at  the popliteal artery.  Lower extremity venous duplex 06/02/2018: No DVT seen  Carotid artery duplex  08/30/2018 Mild mixed plaque bilateral carotid arteries without significant stenosis. Antegrade right vertebral artery flow. Left vertebral artery waveform shows high resistance and may suggest distal stenosis.  Antegrade left vertebral artery flow. Overall no significant change from 11/10/2017.  Recent labs: 01/18/2020: Glucose 203, BUN/Cr 22/1.64. EGFR 45. Na/K 136/4.6. Rest of the CMP normal H/H 12.5/37. MCV 93. Platelets 255 HbA1C 10.5% Chol 162, TG 128, HDL 37, LDL 98  04/25/2019: H/H 12.5/37. MCV 93. Platelets 255  01/28/2019: Glucose 272, BUN/Cr 23/1.42. EGFR 53. Na/K 137/4.7. Rest of the CMP normal H/H 12.5/36.7. MCV 93.4. Platelets 108     Review of Systems  Cardiovascular: Positive for dyspnea on exertion (Improved) and leg swelling (Improved). Negative for chest pain,  palpitations and syncope.  Hematologic/Lymphatic: Does not bruise/bleed easily.  Musculoskeletal: Positive for joint pain.  All other systems reviewed and are negative.    Vitals:   03/19/20 1315  BP: 135/69  Pulse: 61  Resp: 16  SpO2: 96%     Objective:    Physical Exam Vitals and nursing note reviewed.  Constitutional:      General: He is not in acute distress. Neck:     Vascular: No JVD.  Cardiovascular:     Rate and Rhythm: Normal rate and regular rhythm.     Pulses:          Femoral pulses are 1+ on the right side and 1+ on the left side.      Dorsalis pedis pulses are 0 on the right side and 0 on the left side.       Posterior tibial pulses are 1+ on the right side and 1+ on the left side.     Heart sounds: Murmur heard.  Harsh midsystolic murmur is present with a grade of 2/6 at the upper right sternal border radiating to the neck.   Pulmonary:     Effort: Pulmonary effort is normal.     Breath sounds: Normal breath sounds. No wheezing or rales.            Assessment & Recommendations:   79 year old [place] American male with hypertension, hyperlipidemia, hyperlipidemia, type 2 DM, coronary artery disease s/p CABG 1992,  PCI is in 2000, 2001, moderate MR, PAD s/p prior iliac stents, persistent Afib on Xarelto, OSA on CPAP, CKD 3, GERD, h/o MGUS followed by Dr. Annamaria Boots at [facility], bilateral hip osteoarthritis s/p successful left hip arthroplasty in 04/2018.   Exertional dyspnea, leg edema: Mitral regurgitation appears moderate on echocardiogram in 05/2019. Continue conservative medical therapy.  Given initiation of Farxiga-which also has diuretic properties, I have reduced his lasix to 40 mg daily.  CAD: S/p CABG. no angina symptoms at this time. Continue current medical therapy.  Hypertension: Well-controlled.  Persistent Afib:  CHA2DS2VAsc score 4, annual stroke risk 5%. Controlled ventricular rate. Continue anticoagulation with  Xarelto 15 mg daily.   Mild carotid stenosis: Continue medical maangement   F/u in 6 months  Bennett, MD [facility] Pager: [phone] Office: [phone] If no answer Cell [phone]

## 2020-04-11 ENCOUNTER — Telehealth: Payer: Self-pay

## 2020-04-11 NOTE — Telephone Encounter (Signed)
error 

## 2020-04-13 ENCOUNTER — Other Ambulatory Visit: Payer: Self-pay | Admitting: Cardiology

## 2020-05-04 ENCOUNTER — Telehealth: Payer: Self-pay | Admitting: Hematology

## 2020-05-04 NOTE — Telephone Encounter (Signed)
Scheduled appt per 11/5 sch msg - unable to reach pt . Left message for patient with appt date and time

## 2020-05-22 ENCOUNTER — Other Ambulatory Visit: Payer: Self-pay | Admitting: Cardiology

## 2020-05-22 DIAGNOSIS — I340 Nonrheumatic mitral (valve) insufficiency: Secondary | ICD-10-CM

## 2020-06-08 NOTE — Progress Notes (Signed)
[facility]   [phone] [phone]   Clinic Follow up Note   Patient Care Team: Robley Fries, MD as PCP - General (Internal Medicine) Truitt Merle, MD as Consulting Physician (Hematology) Foye Spurling, MD (Inactive) as Consulting Physician (Internal Medicine) Elmarie Shiley, MD as Consulting Physician (Nephrology)  Date of Service:  06/11/2020  CHIEF COMPLAINT:  Follow up IgG MGUS, abnormal bone lesions on CT scan   CURRENT THERAPY:  Observation  INTERVAL HISTORY:  Dustin [name] is here for a follow up of MGUS. Her was last seen by me 11/2017. He presents to the clinic with his family. He notes in the past 2 years he had cataract surgery. He notes after right hip surgery he improved but not able to proceed with left hip surgery due to COVID and heart condition. He had aortic valve regurgitation.     REVIEW OF SYSTEMS:   Constitutional: Denies fevers, chills or abnormal weight loss Eyes: Denies blurriness of vision Ears, nose, mouth, throat, and face: Denies mucositis or sore throat Respiratory: Denies cough, dyspnea or wheezes Cardiovascular: Denies palpitation, chest discomfort or lower extremity swelling Gastrointestinal:  Denies nausea, heartburn or change in bowel habits Skin: Denies abnormal skin rashes Lymphatics: Denies new lymphadenopathy or easy bruising Neurological:Denies numbness, tingling or new weaknesses Behavioral/Psych: Mood is stable, no new changes  All other systems were reviewed with the patient and are negative.  MEDICAL HISTORY:  Past Medical History:  Diagnosis Date  . A-fib ([place])   . Arthritis    "hips" (12/15/2017)  . CKD (chronic kidney disease), stage III ([place])   . High cholesterol   . Hypertension   . OSA on CPAP   . Pneumonia ~ 2017 X1  . PVD (peripheral vascular disease) ([place]) 2008   Infreareanl abdominal aorta covered stent 11/2017. Prior b/l common iliac stents 2008, Lt EIA PTA 11/2017  . Type II  diabetes mellitus ([place])     SURGICAL HISTORY: Past Surgical History:  Procedure Laterality Date  . ABDOMINAL AORTOGRAM N/A 12/08/2017   Procedure: ABDOMINAL AORTOGRAM;  Surgeon: Nigel Mormon, MD;  Location: [facility];  Service: Cardiovascular;  Laterality: N/A;  . CARDIAC CATHETERIZATION  1997; 09/2017  . COLONOSCOPY WITH PROPOFOL N/A 07/23/2017   Procedure: COLONOSCOPY WITH PROPOFOL;  Surgeon: Juanita Craver, MD;  Location: [facility];  Service: Endoscopy;  Laterality: N/A;  . CORONARY ANGIOPLASTY WITH STENT PLACEMENT  2008  . CORONARY ARTERY BYPASS GRAFT  1992   "CABG X3"  . LOWER EXTREMITY ANGIOGRAPHY N/A 12/08/2017   Procedure: LOWER EXTREMITY ANGIOGRAPHY;  Surgeon: Nigel Mormon, MD;  Location: [facility];  Service: Cardiovascular;  Laterality: N/A;  . LOWER EXTREMITY ANGIOGRAPHY N/A 12/15/2017   Procedure: LOWER EXTREMITY ANGIOGRAPHY - Left Leg;  Surgeon: Nigel Mormon, MD;  Location: [facility];  Service: Cardiovascular;  Laterality: N/A;  . PERIPHERAL VASCULAR BALLOON ANGIOPLASTY  12/08/2017   Procedure: PERIPHERAL VASCULAR BALLOON ANGIOPLASTY;  Surgeon: Nigel Mormon, MD;  Location: [facility];  Service: Cardiovascular;;  right common iliac  . PERIPHERAL VASCULAR INTERVENTION  12/08/2017   Procedure: PERIPHERAL VASCULAR INTERVENTION;  Surgeon: Nigel Mormon, MD;  Location: [facility];  Service: Cardiovascular;;  abdominal aorta stent    I have reviewed the social history and family history with the patient and they are unchanged from previous note.  ALLERGIES:  has No Known Allergies.  MEDICATIONS:  Current Outpatient Medications  Medication Sig  Dispense Refill  . acetaminophen (TYLENOL) 650 MG CR tablet Take 650 mg by mouth every 8 (eight) hours as needed.     Marland Kitchen BIDIL 20-37.5 MG tablet TAKE 1 TABLET BY MOUTH THREE TIMES DAILY 180 tablet 3  . bimatoprost (LUMIGAN) 0.03 % ophthalmic solution 1 drop at  bedtime.    . Cholecalciferol (VITAMIN D3) 5000 UNITS CAPS Take 5,000 Units by mouth daily.     . furosemide (LASIX) 40 MG tablet TAKE 1 TABLET(40 MG) BY MOUTH TWICE DAILY 180 tablet 1  . Insulin Glargine, 1 Unit Dial, (TOUJEO SOLOSTAR) 300 UNIT/ML SOPN Inject 50 Units into the skin at bedtime. (Patient taking differently: Inject 45 Units into the skin at bedtime.) 4.5 mL 2  . JARDIANCE 10 MG TABS tablet Take 10 mg by mouth daily.    . Magnesium 250 MG TABS Take 1 tablet by mouth in the morning and at bedtime.    . metoprolol (LOPRESSOR) 50 MG tablet Take 50 mg by mouth 2 (two) times daily.    . Potassium Chloride ER 20 MEQ TBCR TAKE 1 TABLET BY MOUTH TWICE DAILY 60 tablet 3  . Rivaroxaban (XARELTO) 15 MG TABS tablet Take 15 mg by mouth every evening.     . rosuvastatin (CRESTOR) 20 MG tablet TAKE 1 TABLET BY MOUTH EVERY DAY 30 tablet 3  . tamsulosin (FLOMAX) 0.4 MG CAPS capsule daily.  3  . timolol (TIMOPTIC) 0.5 % ophthalmic solution Place 1 drop into both eyes daily.     No current facility-administered medications for this visit.    PHYSICAL EXAMINATION: ECOG PERFORMANCE STATUS: 2 - Symptomatic, <50% confined to bed  Vitals:   06/11/20 1427  BP: (!) 119/55  Pulse: 81  Resp: 18  Temp: 97.9 F (36.6 C)  SpO2: 96%   Filed Weights   06/11/20 1427  Weight: 188 lb 4.8 oz (85.4 kg)    GENERAL:alert, no distress and comfortable SKIN: skin color, texture, turgor are normal, no rashes or significant lesions EYES: normal, Conjunctiva are pink and non-injected, sclera clear  NECK: supple, thyroid normal size, non-tender, without nodularity LYMPH:  no palpable lymphadenopathy in the cervical, axillary  LUNGS: clear to auscultation and percussion with normal breathing effort HEART: regular rate & rhythm and no murmurs and no lower extremity edema ABDOMEN:abdomen soft, non-tender and normal bowel sounds Musculoskeletal:no cyanosis of digits and no clubbing  NEURO: alert & oriented x 3  with fluent speech, no focal motor/sensory deficits EXAM PERFORMED IN WHEELCHAIR   LABORATORY DATA:  I have reviewed the data as listed CBC Latest Ref Rng & Units 06/11/2020 03/29/2018 03/28/2018  WBC 4.0 - 10.5 K/uL 9.3 13.5(H) 14.1(H)  Hemoglobin 13.0 - 17.0 g/dL 13.5 9.9(L) 10.2(L)  Hematocrit 39.0 - 52.0 % 41.4 30.8(L) 31.2(L)  Platelets 150 - 400 K/uL 187 368 331     CMP Latest Ref Rng & Units 06/11/2020 05/13/2018 03/29/2018  Glucose 70 - 99 mg/dL 165(H) - 139(H)  BUN 8 - 23 mg/dL 20 20 25(H)  Creatinine 0.61 - 1.24 mg/dL 1.99(H) 1.3 1.35(H)  Sodium 135 - 145 mmol/L 137 134(A) 134(L)  Potassium 3.5 - 5.1 mmol/L 4.3 4.1 4.6  Chloride 98 - 111 mmol/L 104 - 105  CO2 22 - 32 mmol/L 26 - 24  Calcium 8.9 - 10.3 mg/dL 9.5 - 8.8(L)  Total Protein 6.5 - 8.1 g/dL 8.2(H) - -  Total Bilirubin 0.3 - 1.2 mg/dL 0.6 - -  Alkaline Phos 38 - 126 U/L 58 - -  AST 15 - 41 U/L 18 - -  ALT 0 - 44 U/L 19 - -    MGUS LAB: M-protein  12/05/2014: 0.6 05/04/15: 0.6 08/03/2015: 0.6 09/11/16: 0.5 09/10/17: 0.9 11/25/2017: 0.67  IgG 12/05/14 1880 08/03/2015: 1625 3/15/2-18: 1679 09/10/17: 1936 11/25/2017: 2030   KAPPA, LAMBDA light chains and rat 12/05/2014 and are : 2.76, 1.56, 1.77 05/04/2015: 4.14, 1.69, 2.45 08/03/2015: 2.54, 1.25, 2.04 09/11/2016: 3.96, 1.66, 2.39 09/10/17: 4.73, 1.57, 0.301 11/25/2017: 5.18, 1.80, 2.88   RADIOGRAPHIC STUDIES: I have personally reviewed the radiological images as listed and agreed with the findings in the report. No results found.   ASSESSMENT & PLAN:  VALERIE FREDIN is a 79 y.o. male with    1. 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 Initial SPEP in 11/2014 showed monoclonal IgG gammopathy, with low M protein level (0.5-0.6g/dl), serum free Kappa light chain is slightly elevated, light chain ratio was normal. 2016 Bone survey was negative for lytic lesions.  -Based on the above test  results, he has IgG MGUS. No evidence of multiple myeloma. He does have risk of this evolving to MM. He is currently on observation. No treatment is needed at this time.  -He lost follow up after 2 years (after 2019). He is clinically stable, no new bone pain or fracture. CBC and CMP WNL today except BG 165, Cr 1.99, protein 8.2. MM Panel is still pending. If M protein is stable will follow with labs every 6 months and if elevated will monitor closer at every 3-4 months. No clinicalhigh suspicion for MM -I recommend repeating bone survey in 06/2020 for new baseline. He is agreeable.   -Last colonoscopy was 06/2017. He did have benign polyps removed on pathology. Given his age, pt notes he was not recommended to repeat colonoscopy in the future.  -f/u in 1 year.    2. HTN, DM, CAD, CKD -He has stenosis and mitral valve regurgitation in the past 2 years.  -He will continue follow-up with his primary care physician, Cardiologist, endocrinologist and nephrologist. -Cr has further increased to 1.99 today (06/11/20). I recommend he drink more water and continue to manage his HTN and DM.   3. Bilateral hip arthritis and back pain  -After right hip surgery in 04/2018 he improved but not able to proceed with left hip surgery due to [place] and heart condition.  He will continue follow-up with his orthopedic surgeon.  -Hs is ambulating with cane.    Plan -Whole body bone survey in 06/2020  -Lab in 6 months and 12 months  -F/u in 12 months    No problem-specific Assessment & Plan notes found for this encounter.   Orders Placed This Encounter  Procedures  . DG Bone Survey Met    Standing Status:   Future    Standing Expiration Date:   06/11/2021    Order Specific Question:   Reason for Exam (SYMPTOM  OR DIAGNOSIS REQUIRED)    Answer:   rule out lytic bone lesions    Order Specific Question:   Preferred imaging location?    Answer:   [facility]   All questions were answered. The  patient knows to call the clinic with any problems, questions or concerns. No barriers to learning was detected. The total time spent in the appointment was 25 minutes.     Truitt Merle, MD 06/11/2020   I, Joslyn Devon, am  acting as scribe for Truitt Merle, MD.   I have reviewed the above documentation for accuracy and completeness, and I agree with the above.

## 2020-06-11 ENCOUNTER — Inpatient Hospital Stay: Payer: Medicare Other | Attending: Hematology | Admitting: Hematology

## 2020-06-11 ENCOUNTER — Other Ambulatory Visit: Payer: Self-pay | Admitting: Hematology

## 2020-06-11 ENCOUNTER — Inpatient Hospital Stay: Payer: Medicare Other

## 2020-06-11 ENCOUNTER — Encounter: Payer: Self-pay | Admitting: Hematology

## 2020-06-11 ENCOUNTER — Other Ambulatory Visit: Payer: Self-pay

## 2020-06-11 VITALS — BP 119/55 | HR 81 | Temp 97.9°F | Resp 18 | Ht 67.0 in | Wt 188.3 lb

## 2020-06-11 DIAGNOSIS — Z794 Long term (current) use of insulin: Secondary | ICD-10-CM | POA: Diagnosis not present

## 2020-06-11 DIAGNOSIS — I129 Hypertensive chronic kidney disease with stage 1 through stage 4 chronic kidney disease, or unspecified chronic kidney disease: Secondary | ICD-10-CM | POA: Diagnosis not present

## 2020-06-11 DIAGNOSIS — D472 Monoclonal gammopathy: Secondary | ICD-10-CM

## 2020-06-11 DIAGNOSIS — N183 Chronic kidney disease, stage 3 unspecified: Secondary | ICD-10-CM | POA: Insufficient documentation

## 2020-06-11 DIAGNOSIS — I25810 Atherosclerosis of coronary artery bypass graft(s) without angina pectoris: Secondary | ICD-10-CM

## 2020-06-11 DIAGNOSIS — Z79899 Other long term (current) drug therapy: Secondary | ICD-10-CM | POA: Insufficient documentation

## 2020-06-11 DIAGNOSIS — E1122 Type 2 diabetes mellitus with diabetic chronic kidney disease: Secondary | ICD-10-CM | POA: Insufficient documentation

## 2020-06-11 LAB — CMP (CANCER CENTER ONLY)
ALT: 19 U/L (ref 0–44)
AST: 18 U/L (ref 15–41)
Albumin: 3.8 g/dL (ref 3.5–5.0)
Alkaline Phosphatase: 58 U/L (ref 38–126)
Anion gap: 7 (ref 5–15)
BUN: 20 mg/dL (ref 8–23)
CO2: 26 mmol/L (ref 22–32)
Calcium: 9.5 mg/dL (ref 8.9–10.3)
Chloride: 104 mmol/L (ref 98–111)
Creatinine: 1.99 mg/dL — ABNORMAL HIGH (ref 0.61–1.24)
GFR, Estimated: 33 mL/min — ABNORMAL LOW (ref >60)
Glucose, Bld: 165 mg/dL — ABNORMAL HIGH (ref 70–99)
Potassium: 4.3 mmol/L (ref 3.5–5.1)
Sodium: 137 mmol/L (ref 135–145)
Total Bilirubin: 0.6 mg/dL (ref 0.3–1.2)
Total Protein: 8.2 g/dL — ABNORMAL HIGH (ref 6.5–8.1)

## 2020-06-11 LAB — CBC WITH DIFFERENTIAL (CANCER CENTER ONLY)
Abs Immature Granulocytes: 0.04 10*3/uL (ref 0.00–0.07)
Basophils Absolute: 0.1 10*3/uL (ref 0.0–0.1)
Basophils Relative: 1 %
Eosinophils Absolute: 0.1 10*3/uL (ref 0.0–0.5)
Eosinophils Relative: 1 %
HCT: 41.4 % (ref 39.0–52.0)
Hemoglobin: 13.5 g/dL (ref 13.0–17.0)
Immature Granulocytes: 0 %
Lymphocytes Relative: 22 %
Lymphs Abs: 2.1 10*3/uL (ref 0.7–4.0)
MCH: 29.7 pg (ref 26.0–34.0)
MCHC: 32.6 g/dL (ref 30.0–36.0)
MCV: 91.2 fL (ref 80.0–100.0)
Monocytes Absolute: 0.8 10*3/uL (ref 0.1–1.0)
Monocytes Relative: 9 %
Neutro Abs: 6.2 10*3/uL (ref 1.7–7.7)
Neutrophils Relative %: 67 %
Platelet Count: 187 10*3/uL (ref 150–400)
RBC: 4.54 MIL/uL (ref 4.22–5.81)
RDW: 14.2 % (ref 11.5–15.5)
WBC Count: 9.3 10*3/uL (ref 4.0–10.5)
nRBC: 0 % (ref 0.0–0.2)

## 2020-06-12 LAB — KAPPA/LAMBDA LIGHT CHAINS
Kappa free light chain: 70.8 mg/L — ABNORMAL HIGH (ref 3.3–19.4)
Kappa, lambda light chain ratio: 3.25 — ABNORMAL HIGH (ref 0.26–1.65)
Lambda free light chains: 21.8 mg/L (ref 5.7–26.3)

## 2020-06-13 ENCOUNTER — Telehealth: Payer: Self-pay | Admitting: Hematology

## 2020-06-13 LAB — MULTIPLE MYELOMA PANEL, SERUM
Albumin SerPl Elph-Mcnc: 3.6 g/dL (ref 2.9–4.4)
Albumin/Glob SerPl: 1 (ref 0.7–1.7)
Alpha 1: 0.2 g/dL (ref 0.0–0.4)
Alpha2 Glob SerPl Elph-Mcnc: 0.7 g/dL (ref 0.4–1.0)
B-Globulin SerPl Elph-Mcnc: 1 g/dL (ref 0.7–1.3)
Gamma Glob SerPl Elph-Mcnc: 2 g/dL — ABNORMAL HIGH (ref 0.4–1.8)
Globulin, Total: 3.9 g/dL (ref 2.2–3.9)
IgA: 317 mg/dL (ref 61–437)
IgG (Immunoglobin G), Serum: 1909 mg/dL — ABNORMAL HIGH (ref 603–1613)
IgM (Immunoglobulin M), Srm: 42 mg/dL (ref 15–143)
M Protein SerPl Elph-Mcnc: 0.6 g/dL — ABNORMAL HIGH
Total Protein ELP: 7.5 g/dL (ref 6.0–8.5)

## 2020-06-13 NOTE — Telephone Encounter (Signed)
Scheduled appts per 12/13 los. Left voicemail with appt date and time.  

## 2020-06-18 ENCOUNTER — Telehealth: Payer: Self-pay

## 2020-06-18 NOTE — Telephone Encounter (Signed)
Called left message for pt to call back to receive lab results

## 2020-06-18 NOTE — Telephone Encounter (Signed)
-----   Message from Truitt Merle, MD sent at 06/16/2020  6:20 PM EST ----- Please let pt know that his M protein and light chain level are stable, No concerns. Thanks   Truitt Merle

## 2020-06-18 NOTE — Telephone Encounter (Signed)
I relayed Dr Ernestina Penna comments and recommendations.  Mrs Grabel verbalized understanding.

## 2020-06-26 ENCOUNTER — Encounter (HOSPITAL_COMMUNITY): Payer: Self-pay | Admitting: Emergency Medicine

## 2020-06-26 ENCOUNTER — Emergency Department (HOSPITAL_COMMUNITY): Payer: Medicare Other

## 2020-06-26 ENCOUNTER — Telehealth: Payer: Self-pay | Admitting: Cardiology

## 2020-06-26 ENCOUNTER — Emergency Department (HOSPITAL_COMMUNITY)
Admission: EM | Admit: 2020-06-26 | Discharge: 2020-06-26 | Disposition: A | Discharge status: Home / Self Care | Payer: Medicare Other | Attending: Emergency Medicine | Admitting: Emergency Medicine

## 2020-06-26 DIAGNOSIS — Z7984 Long term (current) use of oral hypoglycemic drugs: Secondary | ICD-10-CM | POA: Insufficient documentation

## 2020-06-26 DIAGNOSIS — Z794 Long term (current) use of insulin: Secondary | ICD-10-CM | POA: Insufficient documentation

## 2020-06-26 DIAGNOSIS — E1151 Type 2 diabetes mellitus with diabetic peripheral angiopathy without gangrene: Secondary | ICD-10-CM | POA: Diagnosis not present

## 2020-06-26 DIAGNOSIS — E1122 Type 2 diabetes mellitus with diabetic chronic kidney disease: Secondary | ICD-10-CM | POA: Insufficient documentation

## 2020-06-26 DIAGNOSIS — R0602 Shortness of breath: Secondary | ICD-10-CM | POA: Insufficient documentation

## 2020-06-26 DIAGNOSIS — I130 Hypertensive heart and chronic kidney disease with heart failure and stage 1 through stage 4 chronic kidney disease, or unspecified chronic kidney disease: Secondary | ICD-10-CM | POA: Diagnosis not present

## 2020-06-26 DIAGNOSIS — I2510 Atherosclerotic heart disease of native coronary artery without angina pectoris: Secondary | ICD-10-CM | POA: Diagnosis not present

## 2020-06-26 DIAGNOSIS — Z951 Presence of aortocoronary bypass graft: Secondary | ICD-10-CM | POA: Diagnosis not present

## 2020-06-26 DIAGNOSIS — E039 Hypothyroidism, unspecified: Secondary | ICD-10-CM | POA: Diagnosis not present

## 2020-06-26 DIAGNOSIS — I4811 Longstanding persistent atrial fibrillation: Secondary | ICD-10-CM | POA: Insufficient documentation

## 2020-06-26 DIAGNOSIS — Z79899 Other long term (current) drug therapy: Secondary | ICD-10-CM | POA: Insufficient documentation

## 2020-06-26 DIAGNOSIS — R55 Syncope and collapse: Secondary | ICD-10-CM | POA: Diagnosis not present

## 2020-06-26 DIAGNOSIS — I5032 Chronic diastolic (congestive) heart failure: Secondary | ICD-10-CM | POA: Diagnosis not present

## 2020-06-26 DIAGNOSIS — E1159 Type 2 diabetes mellitus with other circulatory complications: Secondary | ICD-10-CM | POA: Insufficient documentation

## 2020-06-26 DIAGNOSIS — N183 Chronic kidney disease, stage 3 unspecified: Secondary | ICD-10-CM | POA: Diagnosis not present

## 2020-06-26 LAB — BASIC METABOLIC PANEL
Anion gap: 11 (ref 5–15)
BUN: 28 mg/dL — ABNORMAL HIGH (ref 8–23)
CO2: 22 mmol/L (ref 22–32)
Calcium: 9.5 mg/dL (ref 8.9–10.3)
Chloride: 101 mmol/L (ref 98–111)
Creatinine, Ser: 1.86 mg/dL — ABNORMAL HIGH (ref 0.61–1.24)
GFR, Estimated: 36 mL/min — ABNORMAL LOW (ref >60)
Glucose, Bld: 205 mg/dL — ABNORMAL HIGH (ref 70–99)
Potassium: 4.6 mmol/L (ref 3.5–5.1)
Sodium: 134 mmol/L — ABNORMAL LOW (ref 135–145)

## 2020-06-26 LAB — URINALYSIS, ROUTINE W REFLEX MICROSCOPIC
Bacteria, UA: NONE SEEN
Bilirubin Urine: NEGATIVE
Glucose, UA: >=500 mg/dL — AB
Hgb urine dipstick: NEGATIVE
Ketones, ur: NEGATIVE mg/dL
Leukocytes,Ua: NEGATIVE
Nitrite: NEGATIVE
Protein, ur: NEGATIVE mg/dL
Specific Gravity, Urine: 1.015 (ref 1.005–1.030)
pH: 5 (ref 5.0–8.0)

## 2020-06-26 LAB — CBC
HCT: 41 % (ref 39.0–52.0)
Hemoglobin: 14 g/dL (ref 13.0–17.0)
MCH: 31.3 pg (ref 26.0–34.0)
MCHC: 34.1 g/dL (ref 30.0–36.0)
MCV: 91.7 fL (ref 80.0–100.0)
Platelets: 174 10*3/uL (ref 150–400)
RBC: 4.47 MIL/uL (ref 4.22–5.81)
RDW: 14.6 % (ref 11.5–15.5)
WBC: 9.6 10*3/uL (ref 4.0–10.5)
nRBC: 0 % (ref 0.0–0.2)

## 2020-06-26 LAB — TROPONIN I (HIGH SENSITIVITY): Troponin I (High Sensitivity): 14 ng/L (ref <18)

## 2020-06-26 LAB — CBG MONITORING, ED: Glucose-Capillary: 178 mg/dL — ABNORMAL HIGH (ref 70–99)

## 2020-06-26 NOTE — Telephone Encounter (Signed)
Patient wife called and advised they were on their way to [place] due to patient having some pain . States they were not sure if it was heart related but wanted to make you aware .
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
## 2020-06-26 NOTE — ED Notes (Signed)
Please call patient's wife, Aram Beecham once patient is in a room [phone]

## 2020-06-26 NOTE — Discharge Instructions (Signed)
Follow-up with your doctor in the office.  Please call them tomorrow and let them know about your visit here today.  Please return for headache neck pain chest pain shortness of breath or if you experience another event.  Eat and drink as well as you can for the next 48 hours.

## 2020-06-26 NOTE — ED Notes (Signed)
Lab to add trop to current sample.

## 2020-06-26 NOTE — ED Triage Notes (Signed)
pts wife helps provide history, states that just after breakfast between 1130-12, pts eyes rolled back in his head and he became very stiff as if he were having a seizure. States that episode lasted <27min, they called his PCP who advised they come to the ED for further eval. pts wife states that it took patient a while to come around after the episode. BP was running lower today as well. Denies hx of seizures. Pt a/ox4, resp e/u, nad.

## 2020-07-12 ENCOUNTER — Ambulatory Visit (HOSPITAL_COMMUNITY): Payer: Medicare Other

## 2020-07-20 ENCOUNTER — Telehealth: Payer: Self-pay

## 2020-07-20 NOTE — Telephone Encounter (Signed)
It appears that the patient has complex cardiac history last evaluated by my partner Dr. Virgina Jock in September 2021.  Patient states that he is compliant with his regular medical therapy.  Patient is asked to go to the ER for further evaluation and management to rule out ACS.

## 2020-07-23 ENCOUNTER — Ambulatory Visit: Payer: Medicare Other | Admitting: Cardiology

## 2020-07-23 NOTE — Telephone Encounter (Signed)
I tried calling. Could not get through thr numbers. Is ir possible for him to se me in office? If not, I will be happy to do a virtual visit.  Thanks MJP

## 2020-07-23 NOTE — Telephone Encounter (Signed)
From patient.

## 2020-07-25 ENCOUNTER — Ambulatory Visit: Payer: Medicare Other | Admitting: Cardiology

## 2020-07-26 ENCOUNTER — Other Ambulatory Visit: Payer: Self-pay

## 2020-07-26 ENCOUNTER — Ambulatory Visit: Payer: Medicare Other | Admitting: Cardiology

## 2020-07-26 ENCOUNTER — Encounter: Payer: Self-pay | Admitting: Cardiology

## 2020-07-26 VITALS — BP 108/60 | HR 70 | Temp 98.5°F | Resp 16 | Ht 67.0 in | Wt 184.4 lb

## 2020-07-26 DIAGNOSIS — I739 Peripheral vascular disease, unspecified: Secondary | ICD-10-CM

## 2020-07-26 DIAGNOSIS — I340 Nonrheumatic mitral (valve) insufficiency: Secondary | ICD-10-CM

## 2020-07-26 DIAGNOSIS — I25708 Atherosclerosis of coronary artery bypass graft(s), unspecified, with other forms of angina pectoris: Secondary | ICD-10-CM

## 2020-07-26 DIAGNOSIS — I4819 Other persistent atrial fibrillation: Secondary | ICD-10-CM

## 2020-07-26 MED ORDER — ISOSORBIDE MONONITRATE ER 120 MG PO TB24: 120.0000 mg | ORAL_TABLET | Freq: Every day | ORAL | 3 refills | Status: DC

## 2020-07-26 NOTE — Progress Notes (Signed)
Subjective:   Dustin [name], male    DOB: [birth year], 80 y.o.   MRN: [id number]   Chief complaint:  Aortic stenosis   HPI  80 year-old [place] American male with hypertension, hyperlipidemia, hyperlipidemia, type 2 DM, coronary artery disease s/p CABG 1992, PCI is in 2000, 2001, moderate MR, PAD s/p prior iliac stents, persistent Afib on Xarelto, OSA on CPAP, CKD 3, GERD, h/o MGUS followed by Dr. 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   Recently, patient has had dyspnea, and chest pain radiating to shoulders and back with minimal exertion. Symptoms seem to be worse if he tries to walk after eating. He does not have any symptoms at rest. He has not had any recent syncope or presyncope episodes, after one episode in December 2021. Patient wife also had several questions regarding whether he needs a mitral and aortic valve repair/replacement. Please see my discussion below.  Current Outpatient Medications on File Prior to Visit  Medication Sig Dispense Refill  . acetaminophen (TYLENOL) 650 MG CR tablet Take 650 mg by mouth every 8 (eight) hours as needed (for mild pain).    Marland Kitchen BIDIL 20-37.5 MG tablet TAKE 1 TABLET BY MOUTH THREE TIMES DAILY (Patient taking differently: Take 1 tablet by mouth 3 (three) times daily.) 180 tablet 3  . Cholecalciferol (VITAMIN D-3 PO) Take 2 capsules by mouth daily with breakfast.    . Continuous Blood Gluc Sensor (FREESTYLE LIBRE 2 SENSOR) MISC Inject 1 patch into the skin every 14 (fourteen) days.    . finasteride (PROSCAR) 5 MG tablet Take 5 mg by mouth daily.    . furosemide (LASIX) 40 MG tablet TAKE 1 TABLET(40 MG) BY MOUTH TWICE DAILY (Patient taking differently: Take 40 mg by mouth 2 (two) times daily.) 180 tablet 1  . Insulin Glargine, 1 Unit Dial, (TOUJEO SOLOSTAR) 300 UNIT/ML SOPN Inject 50 Units into the skin at bedtime. (Patient taking differently: Inject 35 Units into the  skin at bedtime.) 4.5 mL 2  . JARDIANCE 10 MG TABS tablet Take 10 mg by mouth daily.    . Magnesium 250 MG TABS Take 250 mg by mouth in the morning and at bedtime.    . metoprolol (LOPRESSOR) 50 MG tablet Take 50 mg by mouth 2 (two) times daily.    . Potassium Chloride ER 20 MEQ TBCR TAKE 1 TABLET BY MOUTH TWICE DAILY (Patient taking differently: Take 20 mEq by mouth 2 (two) times daily.) 60 tablet 3  . Rivaroxaban (XARELTO) 15 MG TABS tablet Take 15 mg by mouth at bedtime.    . rosuvastatin (CRESTOR) 20 MG tablet TAKE 1 TABLET BY MOUTH EVERY DAY (Patient taking differently: Take 20 mg by mouth at bedtime.) 30 tablet 3  . tamsulosin (FLOMAX) 0.4 MG CAPS capsule Take 0.4 mg by mouth in the morning.  3  . timolol (TIMOPTIC) 0.5 % ophthalmic solution Place 1 drop into both eyes in the morning.     No current facility-administered medications on file prior to visit.    Cardiovascular studies:  EKG 03/19/2020: Atrial fibrillation 59 bpm  Old anterior infarct  Echocardiogram 03/14/2020:  Left ventricle cavity is normal in size. Moderate concentric hypertrophy  of the left ventricle. Abnormal septal wall motion due to post-operative  coronary artery bypass graft. Normal LV systolic function with visual EF  50-55%. Unable to evaluate diastolic function due to atrial fibrillation.  Left atrial cavity is severely dilated.  Trileaflet aortic valve with moderate calficiation. Moderate aortic  stenosis. Aortic valve mean gradient of 13 mmHg, Vmax of 2.5 m/s.  Calculated aortic valve area by continuity equation is 1 cm. DVI 0.33.  Mild (Grade I) aortic regurgitation.  Moderate (Grade II) mitral regurgitation. Mildly restricted mitral valve  leaflets.  Mild tricuspid regurgitation. Estimated pulmonary artery systolic pressure  30 mmHg.  No significant change compared to previous study on 06/06/2019.  Carotid artery duplex  08/30/2018: Mild mixed plaque bilateral carotid arteries without  significant stenosis. Antegrade right vertebral artery flow. Left vertebral artery waveform shows high resistance and may suggest distal stenosis.  Antegrade left vertebral artery flow. Overall no significant change from 11/10/2017.  Peripheral angiography and intervention 12/08/2017 & 12/15/2017: Abdominal aorta: Severe ectatic 80% stenosis distal aorta bifurcation-->Successful PTA Rt CIA and covered stent placement Infrarenal abdominal aorta (Viabahn 10.0 X 39 mm covered stent) Bilateral renal arteries: Normal Rt renal artery. Lt renal artery with moderate diffuse disease (limited visualization with CO2) Lt CIA 60% stenosis-->successful PTA with 0% residual stenosis Rt CIA severe 80% restenosis (prior stent)-->successful PTA Mustang 6.0 mm balloon.  Mild restenosis Lt CIA  Lt EIA with distal 95% stenosis, successful drug coated PTA 6.0 X 120 mm In.Pact with 0% residual stenosis Lt common femoral artery 80% stenosis, successful drug coated PTA 6.0 X 120 mm In.Pact with 30% residual stenosis Bilateral SFA: Prox occluded with collaterals seen  Coronary/bnypass graft angiography 08/18/2017: LM: 100% LAD Prox 100%, mid 50%, D1 100% LCx: 100%, OM2 90% RCA: Prox 100%, distal 100%, PDA 100% LIMA-LAD: Patent SVG-D1 100% SVG-RCA: 100% SVG-OM2: 20%  LE arterial ultrasound 01/13/2018: Final Interpretation: Right: Significant plaque morphology and dampened waveforms in the right lower extremity suggest peripheral vascular disease with possible proximal obstruction. There is no obvious evidence of hemodynamically significant stenosis involving the right  lower extremity arteries.    Left: Significant plaque morphology and dampened waveforms in the left lower extremity suggest peripheral vascular disease with possible proximal obstruction. The superficial femoral artery is occluded in its mid to distal segments with reconstitution at  the popliteal artery.  Lower extremity venous duplex  06/02/2018: No DVT seen  Carotid artery duplex  08/30/2018 Mild mixed plaque bilateral carotid arteries without significant stenosis. Antegrade right vertebral artery flow. Left vertebral artery waveform shows high resistance and may suggest distal stenosis.  Antegrade left vertebral artery flow. Overall no significant change from 11/10/2017.  Recent labs: 06/26/2020: Glucose 205, BUN/Cr 28/1/86. EGFR 36. Na/K 134/4.6. Rest of the CMP normal H/H 14/41. MCV 91. Platelets 174  Results for SAAJAN, WILLMON (MRN [id number]) as of 07/26/2020 12:54  Ref. Range 06/26/2020 15:28  Troponin I (High Sensitivity) Latest Ref Range: <18 ng/L 14    01/18/2020: Glucose 203, BUN/Cr 22/1.64. EGFR 45. Na/K 136/4.6. Rest of the CMP normal H/H 12.5/37. MCV 93. Platelets 255 HbA1C 10.5% Chol 162, TG 128, HDL 37, LDL 98  04/25/2019: H/H 12.5/37. MCV 93. Platelets 255  01/28/2019: Glucose 272, BUN/Cr 23/1.42. EGFR 53. Na/K 137/4.7. Rest of the CMP normal H/H 12.5/36.7. MCV 93.4. Platelets 108     Review of Systems  Cardiovascular: Positive for chest pain, dyspnea on exertion (Improved) and leg swelling (Improved). Negative for palpitations and syncope.  Hematologic/Lymphatic: Does not bruise/bleed easily.  Musculoskeletal: Positive for joint pain.  All other systems reviewed and are negative.    Vitals:   07/26/20 1352  BP: 108/60  Pulse: 70  Resp: 16  Temp: 98.5 F (36.9 C)  SpO2: 99%  Objective:    Physical Exam Vitals and nursing note reviewed.  Constitutional:      General: He is not in acute distress. Neck:     Vascular: No JVD.  Cardiovascular:     Rate and Rhythm: Normal rate and regular rhythm.     Pulses:          Femoral pulses are 1+ on the right side and 1+ on the left side.      Dorsalis pedis pulses are 0 on the right side and 0 on the left side.       Posterior tibial pulses are 1+ on the right side and 1+ on the left side.     Heart sounds: Murmur heard.    Harsh midsystolic murmur is present with a grade of 2/6 at the upper right sternal border radiating to the neck.   Pulmonary:     Effort: Pulmonary effort is normal.     Breath sounds: Normal breath sounds. No wheezing or rales.  Musculoskeletal:     Right lower leg: No edema.     Left lower leg: No edema.            Assessment & Recommendations:   80 year old [place] American male with hypertension, hyperlipidemia, hyperlipidemia, type 2 DM, coronary artery disease s/p CABG 1992, PCI is in 2000, 2001, moderate MR, PAD s/p prior iliac stents, persistent Afib on Xarelto, OSA on CPAP, CKD 3, GERD, h/o MGUS followed by Dr. 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   CAD with stable angina: No angina symptoms, similar to his previous angina symptoms several years ago. Last coronary/bypass graft angiogram at [facility] in 2018. No obvious targets noted for revascularization at that time, details above. Given his underlying renal dysfunction and poor revascularization targets, I would be more conservative at this time. Will change BiDil to isosorbide mononitrate 120 mg daily. Next option will be cautious introduction of Ranexa, with close monitoring of renal function.  If symptoms do not improve, will then proceed with stress test and/or coronary bypass graft angiography.   Hypertension: Well-controlled.  Persistent Afib:  CHA2DS2VAsc score 4, annual stroke risk 5%. Controlled ventricular rate. Continue anticoagulation with Xarelto 15 mg daily.   Mild carotid stenosis: Continue medical maangement  Moderate aortic stenosis, moderate mitral regurgitation: Symptoms are related to his valvular disease. Also, his valvular disease is not severe enough to require intervention. Continue monitoring for now.  F/u in 4 weeks  [place], MD [facility] [place] Pager: [phone] Office: [phone] If no  answer Cell [phone]

## 2020-08-16 ENCOUNTER — Ambulatory Visit: Payer: Medicare Other | Admitting: Cardiology

## 2020-08-22 ENCOUNTER — Encounter: Payer: Self-pay | Admitting: Cardiology

## 2020-08-22 ENCOUNTER — Other Ambulatory Visit: Payer: Self-pay

## 2020-08-22 ENCOUNTER — Telehealth: Payer: Medicare Other | Admitting: Cardiology

## 2020-08-22 VITALS — BP 124/68 | HR 79 | Wt 186.0 lb

## 2020-08-22 DIAGNOSIS — I739 Peripheral vascular disease, unspecified: Secondary | ICD-10-CM

## 2020-08-22 DIAGNOSIS — I350 Nonrheumatic aortic (valve) stenosis: Secondary | ICD-10-CM

## 2020-08-22 DIAGNOSIS — I25708 Atherosclerosis of coronary artery bypass graft(s), unspecified, with other forms of angina pectoris: Secondary | ICD-10-CM

## 2020-08-22 DIAGNOSIS — I4819 Other persistent atrial fibrillation: Secondary | ICD-10-CM

## 2020-08-22 DIAGNOSIS — I340 Nonrheumatic mitral (valve) insufficiency: Secondary | ICD-10-CM

## 2020-08-22 MED ORDER — ISOSORBIDE MONONITRATE ER 120 MG PO TB24: 240.0000 mg | ORAL_TABLET | Freq: Every day | ORAL | 3 refills | Status: AC

## 2020-08-22 NOTE — Progress Notes (Signed)
Subjective:   Dustin [name], male    DOB: 08-10-1940, 80 y.o.   MRN: [id number]   Chief complaint:  Aortic stenosis  I connected with the patient on 08/22/2020 by a video enabled telemedicine application and verified that I am speaking with the correct person using two identifiers.     I discussed the limitations of evaluation and management by telemedicine and the availability of in person appointments. The patient expressed understanding and agreed to proceed.   This visit type was conducted due to national recommendations for restrictions regarding the COVID-19 Pandemic (e.g. social distancing).  This format is felt to be most appropriate for this patient at this time.  All issues noted in this document were discussed and addressed.  No physical exam was performed (except for noted visual exam findings with Tele health visits).  The patient has consented to conduct a Tele health visit and understands insurance will be billed.   HPI  80 year-old [place] male with hypertension, hyperlipidemia, hyperlipidemia, type 2 DM, coronary artery disease s/p CABG 1992, PCI is in 2000, 2001, moderate MR, PAD s/p prior iliac stents, persistent Afib on Xarelto, OSA on CPAP, CKD 3, GERD, h/o MGUS followed by Dr. 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   At last visit in 1/20 controlled, patient complained of exertional dyspnea, shoulder/back pain, and leg edema.  I started him on Imdur 120 mg daily today, he reports that his leg edema has resolved.  However, exertional shoulder/back pain and dyspnea persist.  Current Outpatient Medications on File Prior to Visit  Medication Sig Dispense Refill  . acetaminophen (TYLENOL) 650 MG CR tablet Take 650 mg by mouth every 8 (eight) hours as needed (for mild pain).    . Cholecalciferol (VITAMIN D-3 PO) Take 2 capsules by mouth daily with breakfast.    . Continuous Blood Gluc  Sensor (FREESTYLE LIBRE 2 SENSOR) MISC Inject 1 patch into the skin every 14 (fourteen) days.    . finasteride (PROSCAR) 5 MG tablet Take 5 mg by mouth daily.    . furosemide (LASIX) 40 MG tablet TAKE 1 TABLET(40 MG) BY MOUTH TWICE DAILY (Patient taking differently: Take 40 mg by mouth 2 (two) times daily.) 180 tablet 1  . Insulin Glargine, 1 Unit Dial, (TOUJEO SOLOSTAR) 300 UNIT/ML SOPN Inject 50 Units into the skin at bedtime. (Patient taking differently: Inject 35 Units into the skin at bedtime.) 4.5 mL 2  . isosorbide mononitrate (IMDUR) 120 MG 24 hr tablet Take 1 tablet (120 mg total) by mouth daily. 30 tablet 3  . JARDIANCE 10 MG TABS tablet Take 10 mg by mouth daily.    . Magnesium 250 MG TABS Take 250 mg by mouth in the morning and at bedtime.    . metoprolol (LOPRESSOR) 50 MG tablet Take 50 mg by mouth 2 (two) times daily.    . Potassium Chloride ER 20 MEQ TBCR TAKE 1 TABLET BY MOUTH TWICE DAILY (Patient taking differently: Take 20 mEq by mouth 2 (two) times daily.) 60 tablet 3  . Rivaroxaban (XARELTO) 15 MG TABS tablet Take 15 mg by mouth at bedtime.    . rosuvastatin (CRESTOR) 20 MG tablet TAKE 1 TABLET BY MOUTH EVERY DAY (Patient taking differently: Take 20 mg by mouth at bedtime.) 30 tablet 3  . tamsulosin (FLOMAX) 0.4 MG CAPS capsule Take 0.4 mg by mouth in the morning.  3  . timolol (TIMOPTIC) 0.5 %  ophthalmic solution Place 1 drop into both eyes in the morning.     No current facility-administered medications on file prior to visit.    Cardiovascular studies:  EKG 06/26/2020: Atrial fibrillation 59 bpm  Inferolateral T wave inversion, consider ischemia  Echocardiogram 03/14/2020:  Left ventricle cavity is normal in size. Moderate concentric hypertrophy  of the left ventricle. Abnormal septal wall motion due to post-operative  coronary artery bypass graft. Normal LV systolic function with visual EF  50-55%. Unable to evaluate diastolic function due to atrial fibrillation.   Left atrial cavity is severely dilated.  Trileaflet aortic valve with moderate calficiation. Moderate aortic  stenosis. Aortic valve mean gradient of 13 mmHg, Vmax of 2.5 m/s.  Calculated aortic valve area by continuity equation is 1 cm. DVI 0.33.  Mild (Grade I) aortic regurgitation.  Moderate (Grade II) mitral regurgitation. Mildly restricted mitral valve  leaflets.  Mild tricuspid regurgitation. Estimated pulmonary artery systolic pressure  30 mmHg.  No significant change compared to previous study on 06/06/2019.  Carotid artery duplex  08/30/2018: Mild mixed plaque bilateral carotid arteries without significant stenosis. Antegrade right vertebral artery flow. Left vertebral artery waveform shows high resistance and may suggest distal stenosis.  Antegrade left vertebral artery flow. Overall no significant change from 11/10/2017.  Peripheral angiography and intervention 12/08/2017 & 12/15/2017: Abdominal aorta: Severe ectatic 80% stenosis distal aorta bifurcation-->Successful PTA Rt CIA and covered stent placement Infrarenal abdominal aorta (Viabahn 10.0 X 39 mm covered stent) Bilateral renal arteries: Normal Rt renal artery. Lt renal artery with moderate diffuse disease (limited visualization with CO2) Lt CIA 60% stenosis-->successful PTA with 0% residual stenosis Rt CIA severe 80% restenosis (prior stent)-->successful PTA Mustang 6.0 mm balloon.  Mild restenosis Lt CIA  Lt EIA with distal 95% stenosis, successful drug coated PTA 6.0 X 120 mm In.Pact with 0% residual stenosis Lt common femoral artery 80% stenosis, successful drug coated PTA 6.0 X 120 mm In.Pact with 30% residual stenosis Bilateral SFA: Prox occluded with collaterals seen  Coronary/bnypass graft angiography 08/18/2017: LM: 100% LAD Prox 100%, mid 50%, D1 100% LCx: 100%, OM2 90% RCA: Prox 100%, distal 100%, PDA 100% LIMA-LAD: Patent SVG-D1 100% SVG-RCA: 100% SVG-OM2: 20%  LE arterial ultrasound  01/13/2018: Final Interpretation: Right: Significant plaque morphology and dampened waveforms in the right lower extremity suggest peripheral vascular disease with possible proximal obstruction. There is no obvious evidence of hemodynamically significant stenosis involving the right  lower extremity arteries.    Left: Significant plaque morphology and dampened waveforms in the left lower extremity suggest peripheral vascular disease with possible proximal obstruction. The superficial femoral artery is occluded in its mid to distal segments with reconstitution at  the popliteal artery.  Lower extremity venous duplex 06/02/2018: No DVT seen  Carotid artery duplex  08/30/2018 Mild mixed plaque bilateral carotid arteries without significant stenosis. Antegrade right vertebral artery flow. Left vertebral artery waveform shows high resistance and may suggest distal stenosis.  Antegrade left vertebral artery flow. Overall no significant change from 11/10/2017.  Recent labs: 06/26/2020: Glucose 205, BUN/Cr 28/1/86. EGFR 36. Na/K 134/4.6. Rest of the CMP normal H/H 14/41. MCV 91. Platelets 174  Results for NAETHAN, BRACEWELL (MRN [id number]) as of 07/26/2020 12:54  Ref. Range 06/26/2020 15:28  Troponin I (High Sensitivity) Latest Ref Range: <18 ng/L 14    01/18/2020: Glucose 203, BUN/Cr 22/1.64. EGFR 45. Na/K 136/4.6. Rest of the CMP normal H/H 12.5/37. MCV 93. Platelets 255 HbA1C 10.5% Chol 162, TG 128, HDL 37, LDL 98  04/25/2019: H/H 12.5/37. MCV 93. Platelets 255  01/28/2019: Glucose 272, BUN/Cr 23/1.42. EGFR 53. Na/K 137/4.7. Rest of the CMP normal H/H 12.5/36.7. MCV 93.4. Platelets 108     Review of Systems  Cardiovascular: Positive for chest pain and dyspnea on exertion (Improved). Negative for leg swelling, palpitations and syncope.  Hematologic/Lymphatic: Does not bruise/bleed easily.  Musculoskeletal: Positive for joint pain.  All other systems reviewed and are  negative.    Vitals:   08/22/20 1412  BP: 124/68  Pulse: 79  SpO2: 98%      Objective:    Physical Exam Vitals and nursing note reviewed.  Constitutional:      General: He is not in acute distress.    Appearance: He is well-developed and well-nourished.  Pulmonary:     Effort: Pulmonary effort is normal.  Musculoskeletal:     Right lower leg: No edema.     Left lower leg: No edema.  Neurological:     Mental Status: He is alert and oriented to person, place, and time.  Psychiatric:        Mood and Affect: Mood and affect normal.            Assessment & Recommendations:   80 year old [place] American male with hypertension, hyperlipidemia, hyperlipidemia, type 2 DM, coronary artery disease s/p CABG 1992, PCI is in 2000, 2001, moderate MR, PAD s/p prior iliac stents, persistent Afib on Xarelto, OSA on CPAP, CKD 3, GERD, h/o MGUS followed by Dr. 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   CAD with stable angina: I suspect his exertional shoulder/back pain is angina equivalent. I will increase his Imdur from 120 mg to 240 mg daily. Given his prior experience of side effects, he would like to avoid Lexiscan stress test, if possible.  His ambulation is limited due to orthopedic issues.  Given his advanced age and renal dysfunction, I would like to maximize medical therapy before recommending invasive management. I am reluctant to use Ranexa in light of his renal dysfunction.  Hypertension: Well-controlled.  Persistent Afib:  CHA2DS2VAsc score 4, annual stroke risk 5%. Controlled ventricular rate. Continue anticoagulation with Xarelto 15 mg daily.   Mild carotid stenosis: Continue medical maangement  Moderate aortic stenosis, moderate mitral regurgitation: Valvular disease is not severe enough to require intervention. Continue monitoring for now.  F/u in 4 weeks  [place],  MD [facility] Pager: [phone] Office: [phone] If no answer Cell [phone]

## 2020-08-22 NOTE — Telephone Encounter (Signed)
done

## 2020-08-24 NOTE — Telephone Encounter (Signed)
From patient.

## 2020-08-24 NOTE — Telephone Encounter (Signed)
As blood pressure and heart rate are within normal limits, do not suspect this to be related to increased dose of isosorbide.  He is welcome to try taking only 120 mg of isosorbide mononitrate tomorrow to see if his symptoms improve.  If symptoms do improve, advised patient to notify office.  However suspect symptoms to be noncardiac, recommend reaching out to his PCP.

## 2020-08-28 ENCOUNTER — Other Ambulatory Visit: Payer: Self-pay | Admitting: Cardiology

## 2020-08-28 DIAGNOSIS — R0600 Dyspnea, unspecified: Secondary | ICD-10-CM

## 2020-08-28 DIAGNOSIS — I350 Nonrheumatic aortic (valve) stenosis: Secondary | ICD-10-CM

## 2020-08-28 DIAGNOSIS — R0609 Other forms of dyspnea: Secondary | ICD-10-CM

## 2020-08-28 DIAGNOSIS — I340 Nonrheumatic mitral (valve) insufficiency: Secondary | ICD-10-CM

## 2020-08-28 NOTE — Telephone Encounter (Signed)
Spoke with patient's wife. Needs echo in AM and OV same day early PM for shortness of breath. Preferably within next few days.  Thanks MJP

## 2020-08-28 NOTE — Telephone Encounter (Signed)
From pt

## 2020-08-29 NOTE — Telephone Encounter (Signed)
From Dr. Patwardhan

## 2020-09-17 ENCOUNTER — Ambulatory Visit: Payer: Medicare Other | Admitting: Cardiology

## 2020-09-26 ENCOUNTER — Ambulatory Visit: Payer: Medicare Other | Admitting: Cardiology

## 2020-09-26 ENCOUNTER — Encounter: Payer: Self-pay | Admitting: Cardiology

## 2020-09-26 ENCOUNTER — Other Ambulatory Visit: Payer: Self-pay

## 2020-09-26 ENCOUNTER — Ambulatory Visit: Payer: Medicare Other

## 2020-09-26 VITALS — BP 134/67 | HR 60 | Temp 97.7°F | Resp 16 | Ht 67.0 in | Wt 176.2 lb

## 2020-09-26 DIAGNOSIS — I350 Nonrheumatic aortic (valve) stenosis: Secondary | ICD-10-CM

## 2020-09-26 DIAGNOSIS — I739 Peripheral vascular disease, unspecified: Secondary | ICD-10-CM

## 2020-09-26 DIAGNOSIS — I340 Nonrheumatic mitral (valve) insufficiency: Secondary | ICD-10-CM

## 2020-09-26 DIAGNOSIS — I4819 Other persistent atrial fibrillation: Secondary | ICD-10-CM

## 2020-09-26 DIAGNOSIS — R0609 Other forms of dyspnea: Secondary | ICD-10-CM

## 2020-09-26 DIAGNOSIS — R0600 Dyspnea, unspecified: Secondary | ICD-10-CM

## 2020-09-26 DIAGNOSIS — I25708 Atherosclerosis of coronary artery bypass graft(s), unspecified, with other forms of angina pectoris: Secondary | ICD-10-CM

## 2020-09-26 NOTE — Progress Notes (Signed)
Subjective:   Dustin [name], male    DOB: [birth year], 80 y.o.   MRN: [id number]   Chief complaint:  Aortic stenosis   HPI  80 year-old [place] American male with hypertension, hyperlipidemia, hyperlipidemia, type 2 DM, coronary artery disease s/p CABG 1992, PCI is in 2000, 2001, moderate MR, PAD s/p prior iliac stents, persistent Afib on Xarelto, OSA on CPAP, CKD 3, GERD, h/o MGUS followed by Dr. 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   Patient is here today with his wife and daughter. He has continued to have significant symptoms of exertional dyspnea with minimal activity-such as tying his shoe laces, or merely walking the hallway to get to the appt. He also reports "pain between his shoulder blades", which is worse after eating and subsequently walking. 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 Patient states that this pain is similar to his symptoms prior to his CABG. Patient has previously had severe side effects with nuclear stress test and does not want to undergo nuclear stress test. He also does not want to try any new medications at this time. Reviewed echocardiogram performed earlier today with the patient and family.   Current Outpatient Medications on File Prior to Visit  Medication Sig Dispense Refill  . acetaminophen (TYLENOL) 650 MG CR tablet Take 650 mg by mouth every 8 (eight) hours as needed (for mild pain).    . Cholecalciferol (VITAMIN D-3 PO) Take 2 capsules by mouth daily with breakfast.    . Continuous Blood Gluc Sensor (FREESTYLE LIBRE 2 SENSOR) MISC Inject 1 patch into the skin every 14 (fourteen) days.    . furosemide (LASIX) 40 MG tablet TAKE 1 TABLET(40 MG) BY MOUTH TWICE DAILY (Patient taking differently: Take 40 mg by mouth 2 (two) times daily.) 180 tablet 1  . Insulin Glargine, 1 Unit Dial, (TOUJEO  SOLOSTAR) 300 UNIT/ML SOPN Inject 50 Units into the skin at bedtime. (Patient taking differently: Inject 35 Units into the skin at bedtime.) 4.5 mL 2  . isosorbide mononitrate (IMDUR) 120 MG 24 hr tablet Take 2 tablets (240 mg total) by mouth daily. 180 tablet 3  . JARDIANCE 10 MG TABS tablet Take 10 mg by mouth daily.    . Magnesium 250 MG TABS Take 250 mg by mouth in the morning and at bedtime.    . metoprolol (LOPRESSOR) 50 MG tablet Take 50 mg by mouth 2 (two) times daily.    . Potassium Chloride ER 20 MEQ TBCR TAKE 1 TABLET BY MOUTH TWICE DAILY (Patient taking differently: Take 20 mEq by mouth 2 (two) times daily.) 60 tablet 3  . Rivaroxaban (XARELTO) 15 MG TABS tablet Take 15 mg by mouth at bedtime.    . rosuvastatin (CRESTOR) 20 MG tablet TAKE 1 TABLET BY MOUTH EVERY DAY (Patient taking differently: Take 20 mg by mouth at bedtime.) 30 tablet 3  . tamsulosin (FLOMAX) 0.4 MG CAPS capsule Take 0.4 mg by mouth in the morning.  3  . timolol (TIMOPTIC) 0.5 % ophthalmic solution Place 1 drop into both eyes in the morning.     No current facility-administered medications on file prior to visit.    Cardiovascular studies:  Echocardiogram 09/26/2020: Left ventricle cavity is normal in size. Moderate concentric hypertrophy of the left ventricle. Abnormal septal wall motion due to post-operative coronary artery bypass graft. Normal LV systolic function  with visual EF 50-55%. Doppler evidence of grade II (pseudonormal) diastolic dysfunction, elevated LAP.  Left atrial cavity is mildly dilated. Trileaflet aortic valve with moderate aortic valve leaflet calcification..   Mild (Grade I) aortic regurgitation. Vmax 1.6 m/sec, mean PG 6 mmHg, AVA 1.7 cm2 by continuity equation. Mild calcification of the mitral valve annulus. Moderate (Grade II), eccentric, posteriorly directed mitral regurgitation. Mild tricuspid regurgitation.  No evidence of pulmonary hypertension. No significant change compared to  previous study on 03/14/2020.   MRA abdomen 09/14/2020: Abdominal MRA:  1. The suprarenal abdominal aorta is normal in size and morphology. Infrarenal stent with artifact  precludes complete evaluation of most of the infrarenal abdominal aorta. Stent patency not assessed on this  exam.  2. 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 The SMA is patent without visible significant stenosis. The IMA is not well  visualized due to adjacent stent in the infrarenal aorta.   3. There is mild stenosis (<30%) in the bilateral renal artery ostia.  4. Cholelithiasis.    EKG 06/26/2020: Atrial fibrillation 59 bpm  Inferolateral T wave inversion, consider ischemia  Carotid artery duplex  08/30/2018: Mild mixed plaque bilateral carotid arteries without significant stenosis. Antegrade right vertebral artery flow. Left vertebral artery waveform shows high resistance and may suggest distal stenosis.  Antegrade left vertebral artery flow. Overall no significant change from 11/10/2017.  Peripheral angiography and intervention 12/08/2017 & 12/15/2017: Abdominal aorta: Severe ectatic 80% stenosis distal aorta bifurcation-->Successful PTA Rt CIA and covered stent placement Infrarenal abdominal aorta (Viabahn 10.0 X 39 mm covered stent) Bilateral renal arteries: Normal Rt renal artery. Lt renal artery with moderate diffuse disease (limited visualization with CO2) Lt CIA 60% stenosis-->successful PTA with 0% residual stenosis Rt CIA severe 80% restenosis (prior stent)-->successful PTA Mustang 6.0 mm balloon.  Mild restenosis Lt CIA  Lt EIA with distal 95% stenosis, successful drug coated PTA 6.0 X 120 mm In.Pact with 0% residual stenosis Lt common femoral artery 80% stenosis, successful drug coated PTA 6.0 X 120 mm In.Pact with 30% residual stenosis Bilateral SFA: Prox occluded with collaterals seen  Coronary/bnypass graft  angiography 08/18/2017: LM: 100% LAD Prox 100%, mid 50%, D1 100% LCx: 100%, OM2 90% RCA: Prox 100%, distal 100%, PDA 100% LIMA-LAD: Patent SVG-D1 100% SVG-RCA: 100% SVG-OM2: 20%  LE arterial ultrasound 01/13/2018: Final Interpretation: Right: Significant plaque morphology and dampened waveforms in the right lower extremity suggest peripheral vascular disease with possible proximal obstruction. There is no obvious evidence of hemodynamically significant stenosis involving the right  lower extremity arteries.    Left: Significant plaque morphology and dampened waveforms in the left lower extremity suggest peripheral vascular disease with possible proximal obstruction. The superficial femoral artery is occluded in its mid to distal segments with reconstitution at  the popliteal artery.  Lower extremity venous duplex 06/02/2018: No DVT seen  Carotid artery duplex  08/30/2018 Mild mixed plaque bilateral carotid arteries without significant stenosis. Antegrade right vertebral artery flow. Left vertebral artery waveform shows high resistance and may suggest distal stenosis.  Antegrade left vertebral artery flow. Overall no significant change from 11/10/2017.  Recent labs: 09/14/2020: Glucose 200, BUN/Cr 36/1.8. EGFR 38. Na/K 137/3.9. Albumin 3.4. Rest of the CMP normal Pro BNP 2858 (<850) H/H 12.8/40. MCV 93. Platelets 145 Chol 161, TG 284, HDL 30, LDL 74  06/26/2020: Glucose 205, BUN/Cr 28/1/86. EGFR 36. Na/K 134/4.6. Rest of the CMP normal H/H 14/41. MCV  91. Platelets 174  Results for DEVIAN, BARTOLOMEI (MRN [id number]) as of 07/26/2020 12:54  Ref. Range 06/26/2020 15:28  Troponin I (High Sensitivity) Latest Ref Range: <18 ng/L 14    01/18/2020: Glucose 203, BUN/Cr 22/1.64. EGFR 45. Na/K 136/4.6. Rest of the CMP normal H/H 12.5/37. MCV 93. Platelets 255 HbA1C 10.5% Chol 162, TG 128, HDL 37, LDL 98  04/25/2019: H/H 12.5/37. MCV 93. Platelets 255  01/28/2019: Glucose 272,  BUN/Cr 23/1.42. EGFR 53. Na/K 137/4.7. Rest of the CMP normal H/H 12.5/36.7. MCV 93.4. Platelets 108     Review of Systems  Cardiovascular: Positive for chest pain and dyspnea on exertion (Improved). Negative for claudication, leg swelling, palpitations and syncope.  Hematologic/Lymphatic: Does not bruise/bleed easily.  Musculoskeletal: Positive for joint pain.  All other systems reviewed and are negative.    Vitals:   09/26/20 1310  BP: 134/67  Pulse: 60  Resp: 16  Temp: 97.7 F (36.5 C)  SpO2: 98%      Objective:    Physical Exam Vitals and nursing note reviewed.  Constitutional:      General: He is not in acute distress.    Appearance: He is well-developed.  Neck:     Vascular: No JVD.  Cardiovascular:     Rate and Rhythm: Normal rate. Rhythm irregular.     Pulses:          Femoral pulses are 1+ on the right side and 1+ on the left side.      Dorsalis pedis pulses are 0 on the right side and 0 on the left side.       Posterior tibial pulses are 0 on the right side and 0 on the left side.     Heart sounds: Murmur heard.   Harsh midsystolic murmur is present with a grade of 3/6 at the upper right sternal border radiating to the neck.   Pulmonary:     Effort: Pulmonary effort is normal.     Breath sounds: Normal breath sounds. No wheezing or rales.  Musculoskeletal:     Right lower leg: No edema.     Left lower leg: No edema.  Neurological:     Mental Status: He is alert and oriented to person, place, and time.            Assessment & Recommendations:   80 year old [place] American male with hypertension, hyperlipidemia, hyperlipidemia, type 2 DM, coronary artery disease s/p CABG 1992, PCI is in 2000, 2001, moderate MR, PAD s/p prior iliac stents, persistent Afib on Xarelto, OSA on CPAP, CKD 3, GERD, h/o MGUS followed by Dr. 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   CAD with stable  angina: I suspect his exertional shoulder/back pain is angina equivalent.  There is no significant suprarenal aorta abnormalities on recent MRI abdomen performed at [facility].  Consequently, exertional dyspnea and elevated proBNP, and grade 2 diastolic dysfunction on echocardiogram point towards HFpEF.  In addition to continuing current medical management, including antianginal therapy, diuresis with Lasix, reevaluation for obstructive CAD is necessary.  Patient does not want to undergo nuclear stress test based on his previous experience of side effects.  Daughter asked about stress perfusion MRI.  I explained that this can be performed in the hospital, as we do not have capability to perform this outpatient.  However, definitive test would be invasive angiography and possible intervention.  Significant risk of contrast-induced nephropathy remains given his GFR of 38.  However, based on prior report from [place] in 2019, his only patent vessels are LIMA-LAD and SVG-OM2.  I would recommend limited angiography to minimize contrast use.  I would also recommend hydration and/or Lasix use based on filling pressures obtained on left heart catheterization, as well as right heart catheterization.  In the setting the patient is reluctant to take any additional antianginal medications and is well optimized on current antianginal regimen, next best option is possible intervention, should there be any obstructive disease in LIMA-LAD or SVG-OM2.  I will perform the procedure through left radial artery and left brachial vein.  Given his significant aortoiliac disease, I would like to avoid femoral access, if possible.  Patient and family understand all risks, benefits, alternate options.  They would like to think about it and inform me, if and when they would like to proceed with the above.   Hypertension: Well-controlled.  Persistent Afib:  CHA2DS2VAsc score 4, annual stroke risk 5%. Controlled ventricular rate. Continue  anticoagulation with Xarelto 15 mg daily.   Mild carotid stenosis: Continue medical maangement  Mild aortic stenosis, moderate mitral regurgitation: Relatively unchanged, continue surveillance.  F/u in 4 weeks  [place], MD [facility] [place] Pager: [phone] Office: [phone] If no answer Cell [phone]

## 2020-09-27 NOTE — Telephone Encounter (Signed)
From patient.

## 2020-10-11 ENCOUNTER — Telehealth: Payer: Self-pay

## 2020-10-11 NOTE — Telephone Encounter (Signed)
Pts wife called regarding a procedure the pt is having done tomorrow. Pt is scheduled to have an angiography tomorrow at 11:00. She is unsure of the details regarding this procedure and what the pt needs to do.

## 2020-10-11 NOTE — Telephone Encounter (Signed)
Left and right heart catheterization, coronary angiography, and possible intervention is currently scheduled for 10/16/2020.  On reviewing recent labs, I noticed that his platelet count is down to 96, previously noted to be 187 in 05/2020, and 155 in 08/2020.  Patient has known MGUS, last seen by hematologist Dr. Burr Medico in 05/2020.  He is known to have lytic lesions in L1 and pelvic bone in 10/2017.  Dr. Burr Medico had reviewed CBC, CMP to be in normal limits.  She had recommended repeat bone scan in 06/2020.  It does not appear that patient underwent this bone scan.  Given lack of repeat bone scan, and now drop in platelet count, I am concerned about his bleeding risk prior to committing him to coronary stent placement and addition of Plavix (patient is already on Xarelto), prior to getting Dr. Ernestina Penna opinion about it.  I propose the following to the patient and wife I will perform diagnostic coronary and bypass graft angiogram, left heart catheterization and right heart catheterization on 10/16/2020.  I will get Dr. Ernestina Penna opinion regarding his bleeding risk.  Based on bleeding risk, should he have any obstructive coronary artery disease, will staged intervention at a later date.  Both patient and wife are in agreement.  I will forward my note to Dr. Annamaria Boots and seek her opinion.   Nigel Mormon, MD Pager: [phone] Office: [phone]

## 2020-10-11 NOTE — Progress Notes (Signed)
10/09/2020: Glucose 149, BUN/Cr 22/1.64. EGFR 42. Na/K 136/4.0. Rest of the CMP normal H/H 13/40. MCV 93. Platelets 96

## 2020-10-11 NOTE — Progress Notes (Signed)
09/14/2020: H/H 13/41. MCV 91. Platelets 187  06/26/2020: H/H 14/41. MCV 91. Platelets 174

## 2020-10-12 ENCOUNTER — Telehealth: Payer: Self-pay | Admitting: Hematology

## 2020-10-12 NOTE — Telephone Encounter (Signed)
Scheduled appts per 4/14 sch msg. Pt aware.

## 2020-10-13 NOTE — Telephone Encounter (Signed)
Thank you for your message, Dr. Burr Medico. It looks like patient has made an appt to se you on 4/19. He wants to see you before the cath.  Staff, we will reschedule RLCGP to 5/3. He will need COVID test before then. Please send me message separately, if you have any questions NL:[id number].  Thanks MJP

## 2020-10-16 ENCOUNTER — Inpatient Hospital Stay (HOSPITAL_BASED_OUTPATIENT_CLINIC_OR_DEPARTMENT_OTHER): Payer: Medicare Other | Admitting: Hematology

## 2020-10-16 ENCOUNTER — Encounter: Payer: Self-pay | Admitting: Hematology

## 2020-10-16 ENCOUNTER — Other Ambulatory Visit: Payer: Self-pay

## 2020-10-16 ENCOUNTER — Ambulatory Visit (HOSPITAL_COMMUNITY)
Admission: RE | Admit: 2020-10-16 | Discharge: 2020-10-16 | Disposition: A | Discharge status: Home / Self Care | Payer: Medicare Other | Source: Ambulatory Visit | Attending: Hematology | Admitting: Hematology

## 2020-10-16 ENCOUNTER — Telehealth: Payer: Self-pay

## 2020-10-16 ENCOUNTER — Encounter (HOSPITAL_COMMUNITY): Admission: RE | Payer: Self-pay | Source: Home / Self Care

## 2020-10-16 ENCOUNTER — Inpatient Hospital Stay: Payer: Medicare Other | Attending: Hematology

## 2020-10-16 ENCOUNTER — Ambulatory Visit (HOSPITAL_COMMUNITY): Admission: RE | Admit: 2020-10-16 | Payer: Medicare Other | Source: Home / Self Care | Admitting: Cardiology

## 2020-10-16 VITALS — BP 149/77 | HR 57 | Temp 97.2°F | Resp 16 | Ht 67.0 in | Wt 180.0 lb

## 2020-10-16 DIAGNOSIS — N189 Chronic kidney disease, unspecified: Secondary | ICD-10-CM | POA: Diagnosis not present

## 2020-10-16 DIAGNOSIS — D472 Monoclonal gammopathy: Secondary | ICD-10-CM

## 2020-10-16 DIAGNOSIS — I129 Hypertensive chronic kidney disease with stage 1 through stage 4 chronic kidney disease, or unspecified chronic kidney disease: Secondary | ICD-10-CM | POA: Insufficient documentation

## 2020-10-16 DIAGNOSIS — E1122 Type 2 diabetes mellitus with diabetic chronic kidney disease: Secondary | ICD-10-CM | POA: Diagnosis not present

## 2020-10-16 DIAGNOSIS — I25708 Atherosclerosis of coronary artery bypass graft(s), unspecified, with other forms of angina pectoris: Secondary | ICD-10-CM | POA: Diagnosis not present

## 2020-10-16 DIAGNOSIS — Z794 Long term (current) use of insulin: Secondary | ICD-10-CM | POA: Diagnosis not present

## 2020-10-16 LAB — CMP (CANCER CENTER ONLY)
ALT: 12 U/L (ref 0–44)
AST: 14 U/L — ABNORMAL LOW (ref 15–41)
Albumin: 4 g/dL (ref 3.5–5.0)
Alkaline Phosphatase: 57 U/L (ref 38–126)
Anion gap: 11 (ref 5–15)
BUN: 28 mg/dL — ABNORMAL HIGH (ref 8–23)
CO2: 24 mmol/L (ref 22–32)
Calcium: 9.5 mg/dL (ref 8.9–10.3)
Chloride: 104 mmol/L (ref 98–111)
Creatinine: 2.01 mg/dL — ABNORMAL HIGH (ref 0.61–1.24)
GFR, Estimated: 33 mL/min — ABNORMAL LOW (ref >60)
Glucose, Bld: 167 mg/dL — ABNORMAL HIGH (ref 70–99)
Potassium: 4.2 mmol/L (ref 3.5–5.1)
Sodium: 139 mmol/L (ref 135–145)
Total Bilirubin: 0.5 mg/dL (ref 0.3–1.2)
Total Protein: 8.5 g/dL — ABNORMAL HIGH (ref 6.5–8.1)

## 2020-10-16 LAB — CBC WITH DIFFERENTIAL (CANCER CENTER ONLY)
Abs Immature Granulocytes: 0.04 10*3/uL (ref 0.00–0.07)
Basophils Absolute: 0.1 10*3/uL (ref 0.0–0.1)
Basophils Relative: 1 %
Eosinophils Absolute: 0.2 10*3/uL (ref 0.0–0.5)
Eosinophils Relative: 2 %
HCT: 42.1 % (ref 39.0–52.0)
Hemoglobin: 13.9 g/dL (ref 13.0–17.0)
Immature Granulocytes: 1 %
Lymphocytes Relative: 33 %
Lymphs Abs: 2.6 10*3/uL (ref 0.7–4.0)
MCH: 30.7 pg (ref 26.0–34.0)
MCHC: 33 g/dL (ref 30.0–36.0)
MCV: 92.9 fL (ref 80.0–100.0)
Monocytes Absolute: 0.8 10*3/uL (ref 0.1–1.0)
Monocytes Relative: 10 %
Neutro Abs: 4.3 10*3/uL (ref 1.7–7.7)
Neutrophils Relative %: 53 %
Platelet Count: 185 10*3/uL (ref 150–400)
RBC: 4.53 MIL/uL (ref 4.22–5.81)
RDW: 14.6 % (ref 11.5–15.5)
WBC Count: 8 10*3/uL (ref 4.0–10.5)
nRBC: 0 % (ref 0.0–0.2)

## 2020-10-16 SURGERY — RIGHT/LEFT HEART CATH AND CORONARY/GRAFT ANGIOGRAPHY
Anesthesia: LOCAL

## 2020-10-16 NOTE — Telephone Encounter (Signed)
I called and spoke with Peggy in centralized scheduling this morning to schedule this patient's DG Bone Survey Met. It has been scheduled for 4/21 at 10 am and the patient is to arrive by 9:45 am at [facility]. The patient has been made aware of this and verbalized understanding.  

## 2020-10-16 NOTE — Telephone Encounter (Signed)
Okay to take Booster shot  [place], please check my and cath lab schedule and let the patient know accordingly.  Thanks MJP

## 2020-10-16 NOTE — Telephone Encounter (Signed)
From pt

## 2020-10-16 NOTE — Progress Notes (Signed)
[place]   [phone] Fax:([phone]) [phone]   Clinic Follow up Note   Patient Care Team: Robley Fries, MD as PCP - General (Internal Medicine) Truitt Merle, MD as Consulting Physician (Hematology) Foye Spurling, MD (Inactive) as Consulting Physician (Internal Medicine) Elmarie Shiley, MD as Consulting Physician (Nephrology)  Date of Service:  10/16/2020  CHIEF COMPLAINT:  Follow up IgG MGUS  CURRENT THERAPY:  Observation  INTERVAL HISTORY:  Dustin [name] is here for a follow up of MGUS. He has been feeling more fatigued lately, and developed some dyspnea after meal and exertion.  He is very sedentary, does not exercise or go out much. He saw his cardiologist Dr. Robb Matar recently, and plan to have cardiac catheterization and possible stent placement if needed.  Dr. Virgina Jock was concerned about his mild thrombocytopenia, and encouraged him to follow-up with me before the procedure.  Patient also went to [facility] cardiology for second opinion and had MRI of abdomen done at [facility].  He is chronic back pain is mild and stable, he had right hip replacement in 2019, is going to have left hip surgery in future, he denies any other new bone pain or other new complaints.  Weight is stable.  No bleeding, bruising.  All other systems were reviewed with the patient and are negative.  MEDICAL HISTORY:  Past Medical History:  Diagnosis Date  . A-fib ([place])   . Arthritis    "hips" (12/15/2017)  . CKD (chronic kidney disease), stage III ([place])   . High cholesterol   . Hypertension   . OSA on CPAP   . Pneumonia ~ 2017 X1  . PVD (peripheral vascular disease) ([place]) 2008   Infreareanl abdominal aorta covered stent 11/2017. Prior b/l common iliac stents 2008, Lt EIA PTA 11/2017  . Type II diabetes mellitus ([place])     SURGICAL HISTORY: Past Surgical History:  Procedure Laterality Date  . ABDOMINAL AORTOGRAM N/A 12/08/2017   Procedure: ABDOMINAL AORTOGRAM;  Surgeon:  Nigel Mormon, MD;  Location: [place] [facility];  Service: Cardiovascular;  Laterality: N/A;  . CARDIAC CATHETERIZATION  1997; 09/2017  . COLONOSCOPY WITH PROPOFOL N/A 07/23/2017   Procedure: COLONOSCOPY WITH PROPOFOL;  Surgeon: Juanita Craver, MD;  Location: [facility];  Service: Endoscopy;  Laterality: N/A;  . CORONARY ANGIOPLASTY WITH STENT PLACEMENT  2008  . CORONARY ARTERY BYPASS GRAFT  1992   "CABG X3"  . LOWER EXTREMITY ANGIOGRAPHY N/A 12/08/2017   Procedure: LOWER EXTREMITY ANGIOGRAPHY;  Surgeon: Nigel Mormon, MD;  Location: [place] [facility];  Service: Cardiovascular;  Laterality: N/A;  . LOWER EXTREMITY ANGIOGRAPHY N/A 12/15/2017   Procedure: LOWER EXTREMITY ANGIOGRAPHY - Left Leg;  Surgeon: Nigel Mormon, MD;  Location: [place] [facility];  Service: Cardiovascular;  Laterality: N/A;  . PERIPHERAL VASCULAR BALLOON ANGIOPLASTY  12/08/2017   Procedure: PERIPHERAL VASCULAR BALLOON ANGIOPLASTY;  Surgeon: Nigel Mormon, MD;  Location: [place] [facility];  Service: Cardiovascular;;  right common iliac  . PERIPHERAL VASCULAR INTERVENTION  12/08/2017   Procedure: PERIPHERAL VASCULAR INTERVENTION;  Surgeon: Nigel Mormon, MD;  Location: [place] [facility];  Service: Cardiovascular;;  abdominal aorta stent    I have reviewed the social history and family history with the patient and they are unchanged from previous note.  ALLERGIES:  has No Known Allergies.  MEDICATIONS:  Current Outpatient Medications  Medication Sig Dispense Refill  . acetaminophen (TYLENOL) 650 MG CR tablet Take 650 mg by mouth every  8 (eight) hours as needed (for mild pain).    . Cholecalciferol (VITAMIN D3) 125 MCG (5000 UT) TABS Take 10,000 Units by mouth daily in the afternoon.    . Continuous Blood Gluc Sensor (FREESTYLE LIBRE 2 SENSOR) MISC Inject 1 patch into the skin every 14 (fourteen) days.    . furosemide (LASIX) 40 MG tablet TAKE 1 TABLET(40 MG) BY MOUTH TWICE DAILY (Patient  taking differently: Take 40 mg by mouth 2 (two) times daily.) 180 tablet 1  . Insulin Glargine, 1 Unit Dial, (TOUJEO SOLOSTAR) 300 UNIT/ML SOPN Inject 50 Units into the skin at bedtime. (Patient taking differently: Inject 25 Units into the skin at bedtime.) 4.5 mL 2  . isosorbide mononitrate (IMDUR) 120 MG 24 hr tablet Take 2 tablets (240 mg total) by mouth daily. (Patient taking differently: Take 120 mg by mouth in the morning.) 180 tablet 3  . JARDIANCE 10 MG TABS tablet Take 10 mg by mouth in the morning and at bedtime.    . Magnesium 250 MG TABS Take 250 mg by mouth in the morning and at bedtime.    . metoprolol (TOPROL-XL) 200 MG 24 hr tablet Take 200 mg by mouth in the morning.    . Potassium Chloride ER 20 MEQ TBCR TAKE 1 TABLET BY MOUTH TWICE DAILY (Patient taking differently: Take 20 mEq by mouth 2 (two) times daily.) 60 tablet 3  . Rivaroxaban (XARELTO) 15 MG TABS tablet Take 15 mg by mouth at bedtime.    . rosuvastatin (CRESTOR) 20 MG tablet TAKE 1 TABLET BY MOUTH EVERY DAY (Patient taking differently: Take 20 mg by mouth at bedtime.) 30 tablet 3  . tamsulosin (FLOMAX) 0.4 MG CAPS capsule Take 0.4 mg by mouth in the morning and at bedtime.  3  . timolol (TIMOPTIC) 0.5 % ophthalmic solution Place 1 drop into both eyes in the morning.     No current facility-administered medications for this visit.    PHYSICAL EXAMINATION: ECOG PERFORMANCE STATUS: 2 - Symptomatic, <50% confined to bed  Vitals:   10/16/20 1130  BP: (!) 149/77  Pulse: (!) 57  Resp: 16  Temp: (!) 97.2 F (36.2 C)  SpO2: 100%   Filed Weights   10/16/20 1130  Weight: 180 lb (81.6 kg)    GENERAL:alert, no distress and comfortable SKIN: skin color, texture, turgor are normal, no rashes or significant lesions, petechia or ecchymosis. EYES: normal, Conjunctiva are pink and non-injected, sclera clear  NECK: supple, thyroid normal size, non-tender, without nodularity LYMPH:  no palpable lymphadenopathy in the  cervical, axillary  LUNGS: clear to auscultation and percussion with normal breathing effort HEART: regular rate & rhythm and no murmurs and no lower extremity edema ABDOMEN:abdomen soft, non-tender and normal bowel sounds Musculoskeletal:no cyanosis of digits and no clubbing, no spinal tenderness NEURO: alert & oriented x 3 with fluent speech, no focal motor/sensory deficits EXAM PERFORMED IN WHEELCHAIR   LABORATORY DATA:  I have reviewed the data as listed CBC Latest Ref Rng & Units 10/16/2020 06/26/2020 06/11/2020  WBC 4.0 - 10.5 K/uL 8.0 9.6 9.3  Hemoglobin 13.0 - 17.0 g/dL 13.9 14.0 13.5  Hematocrit 39.0 - 52.0 % 42.1 41.0 41.4  Platelets 150 - 400 K/uL 185 174 187     CMP Latest Ref Rng & Units 10/16/2020 06/26/2020 06/11/2020  Glucose 70 - 99 mg/dL 167(H) 205(H) 165(H)  BUN 8 - 23 mg/dL 28(H) 28(H) 20  Creatinine 0.61 - 1.24 mg/dL 2.01(H) 1.86(H) 1.99(H)  Sodium 135 - 145  mmol/L 139 134(L) 137  Potassium 3.5 - 5.1 mmol/L 4.2 4.6 4.3  Chloride 98 - 111 mmol/L 104 101 104  CO2 22 - 32 mmol/L _0 Calcium 8.9 - 10.3 mg/dL 9.5 9.5 9.5  Total Protein 6.5 - 8.1 g/dL 8.5(H) - 8.2(H)  Total Bilirubin 0.3 - 1.2 mg/dL 0.5 - 0.6  Alkaline Phos 38 - 126 U/L 57 - 58  AST 15 - 41 U/L 14(L) - 18  ALT 0 - 44 U/L 12 - 19    MGUS LAB: M-protein  12/05/2014: 0.6 05/04/15: 0.6 08/03/2015: 0.6 09/11/16: 0.5 09/10/17: 0.9 11/25/2017: 0.67 06/11/2020: 0.6  IgG 12/05/14 1880 08/03/2015: 1625 3/15/2-18: 1679 09/10/17: 1936 11/25/2017: 2030 06/11/2020: 1909   KAPPA, LAMBDA light chains and rat 12/05/2014 and are : 2.76, 1.56, 1.77 05/04/2015: 4.14, 1.69, 2.45 08/03/2015: 2.54, 1.25, 2.04 09/11/2016: 3.96, 1.66, 2.39 09/10/17: 4.73, 1.57, 0.301 11/25/2017: 5.18, 1.80, 2.88 06/11/2020 (mg/L): 70.8, 21.8, 3.25   RADIOGRAPHIC STUDIES: I have personally reviewed the radiological images as listed and agreed with the findings in the report. No results found.   ASSESSMENT & PLAN:  Dustin [name] is a 80 y.o. male with    1. 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 Initial SPEP in 11/2014 showed monoclonal IgG gammopathy, with low M protein level (0.5-0.6g/dl), serum free Kappa light chain is slightly elevated, light chain ratio was normal. 2016 Bone survey was negative for lytic lesions.  -Based on the above test results, he has IgG MGUS. No evidence of multiple myeloma. He does have risk of this evolving to MM. He is currently on observation. No treatment is needed at this time.  -He lost follow up after 2 years (after 2019). He is clinically stable, no new bone pain or fracture. CBC and CMP WNL today except BG 167, Cr 2.01, protein 8.5 MM Panel is still pending.  CBC were within normal limits.  No clinicalhigh suspicion for MM, his M protein has been very stable since 2019. -He is scheduled for bone survey later this week, will move up to today if possible. -I will see him back in December of this year for follow-up, or sooner if needed.  2.  Thrombocytopenia? -His CBC with his nephrologist Dr. Posey Pronto a few weeks ago showed platelets 96K, repeat CBC today showed a platelet of 185K.  Her platelet has been normal except 1 time 145K 2 years ago in our system. -I do not really think she has thrombocytopenia.  I reassured him he is fine to have cardiac cath and stent placement, and okay to take a platelet and aspirin if needed after procedure.  I do not think he is at great risk for bleeding.   3. HTN, DM, CAD, CKD -He has stenosis and mitral valve regurgitation in the past 2 years.  -He will continue follow-up with his primary care physician, Cardiologist, endocrinologist and nephrologist. -Cr has further increased to 1.99 today (06/11/20). I recommend he drink more water and continue to manage his HTN and DM.   4. Bilateral hip arthritis and back pain  -After right hip surgery in 04/2018 he improved but  not able to proceed with left hip surgery due to [place] and heart condition.  He will continue follow-up with his orthopedic surgeon.  -Hs is ambulating with cane.  -I encouraged him to exercise   Plan -Whole body bone survey today after visit  -  Labs and follow-up in December 2022 -We will call him when his multiple myeloma lab returns -He is cleared from hematological standpoint to proceed with cardiac cath, stent placement and dual antiplatelet agents if needed.   No problem-specific Assessment & Plan notes found for this encounter.   No orders of the defined types were placed in this encounter.  All questions were answered. The patient knows to call the clinic with any problems, questions or concerns. No barriers to learning was detected. The total time spent in the appointment was 30 minutes.     Truitt Merle, MD 10/16/2020

## 2020-10-17 LAB — KAPPA/LAMBDA LIGHT CHAINS
Kappa free light chain: 81.7 mg/L — ABNORMAL HIGH (ref 3.3–19.4)
Kappa, lambda light chain ratio: 3.36 — ABNORMAL HIGH (ref 0.26–1.65)
Lambda free light chains: 24.3 mg/L (ref 5.7–26.3)

## 2020-10-18 ENCOUNTER — Ambulatory Visit (HOSPITAL_COMMUNITY): Payer: Medicare Other

## 2020-10-18 LAB — MULTIPLE MYELOMA PANEL, SERUM
Albumin SerPl Elph-Mcnc: 3.9 g/dL (ref 2.9–4.4)
Albumin/Glob SerPl: 1 (ref 0.7–1.7)
Alpha 1: 0.2 g/dL (ref 0.0–0.4)
Alpha2 Glob SerPl Elph-Mcnc: 0.8 g/dL (ref 0.4–1.0)
B-Globulin SerPl Elph-Mcnc: 1.1 g/dL (ref 0.7–1.3)
Gamma Glob SerPl Elph-Mcnc: 1.9 g/dL — ABNORMAL HIGH (ref 0.4–1.8)
Globulin, Total: 4.1 g/dL — ABNORMAL HIGH (ref 2.2–3.9)
IgA: 365 mg/dL (ref 61–437)
IgG (Immunoglobin G), Serum: 2155 mg/dL — ABNORMAL HIGH (ref 603–1613)
IgM (Immunoglobulin M), Srm: 49 mg/dL (ref 15–143)
M Protein SerPl Elph-Mcnc: 0.9 g/dL — ABNORMAL HIGH
Total Protein ELP: 8 g/dL (ref 6.0–8.5)

## 2020-10-29 ENCOUNTER — Other Ambulatory Visit: Payer: Self-pay

## 2020-10-29 ENCOUNTER — Ambulatory Visit (HOSPITAL_COMMUNITY)
Admission: RE | Admit: 2020-10-29 | Discharge: 2020-10-29 | Disposition: A | Discharge status: Home / Self Care | Payer: Medicare Other | Source: Ambulatory Visit | Attending: Cardiology | Admitting: Cardiology

## 2020-10-29 ENCOUNTER — Ambulatory Visit (HOSPITAL_COMMUNITY)
Admission: RE | Disposition: A | Discharge status: Home / Self Care | Payer: Self-pay | Source: Ambulatory Visit | Attending: Cardiology

## 2020-10-29 DIAGNOSIS — I4819 Other persistent atrial fibrillation: Secondary | ICD-10-CM | POA: Insufficient documentation

## 2020-10-29 DIAGNOSIS — E1122 Type 2 diabetes mellitus with diabetic chronic kidney disease: Secondary | ICD-10-CM | POA: Insufficient documentation

## 2020-10-29 DIAGNOSIS — I350 Nonrheumatic aortic (valve) stenosis: Secondary | ICD-10-CM | POA: Insufficient documentation

## 2020-10-29 DIAGNOSIS — I25708 Atherosclerosis of coronary artery bypass graft(s), unspecified, with other forms of angina pectoris: Secondary | ICD-10-CM | POA: Diagnosis present

## 2020-10-29 DIAGNOSIS — I340 Nonrheumatic mitral (valve) insufficiency: Secondary | ICD-10-CM | POA: Diagnosis not present

## 2020-10-29 DIAGNOSIS — I6523 Occlusion and stenosis of bilateral carotid arteries: Secondary | ICD-10-CM | POA: Insufficient documentation

## 2020-10-29 DIAGNOSIS — Z7901 Long term (current) use of anticoagulants: Secondary | ICD-10-CM | POA: Diagnosis not present

## 2020-10-29 DIAGNOSIS — Z794 Long term (current) use of insulin: Secondary | ICD-10-CM | POA: Diagnosis not present

## 2020-10-29 DIAGNOSIS — Z79899 Other long term (current) drug therapy: Secondary | ICD-10-CM | POA: Diagnosis not present

## 2020-10-29 DIAGNOSIS — R0600 Dyspnea, unspecified: Secondary | ICD-10-CM

## 2020-10-29 DIAGNOSIS — Z20822 Contact with and (suspected) exposure to covid-19: Secondary | ICD-10-CM | POA: Diagnosis not present

## 2020-10-29 DIAGNOSIS — N183 Chronic kidney disease, stage 3 unspecified: Secondary | ICD-10-CM | POA: Diagnosis not present

## 2020-10-29 DIAGNOSIS — I130 Hypertensive heart and chronic kidney disease with heart failure and stage 1 through stage 4 chronic kidney disease, or unspecified chronic kidney disease: Secondary | ICD-10-CM | POA: Insufficient documentation

## 2020-10-29 DIAGNOSIS — R0609 Other forms of dyspnea: Secondary | ICD-10-CM

## 2020-10-29 DIAGNOSIS — Z7984 Long term (current) use of oral hypoglycemic drugs: Secondary | ICD-10-CM | POA: Diagnosis not present

## 2020-10-29 DIAGNOSIS — I5032 Chronic diastolic (congestive) heart failure: Secondary | ICD-10-CM | POA: Insufficient documentation

## 2020-10-29 DIAGNOSIS — I5030 Unspecified diastolic (congestive) heart failure: Secondary | ICD-10-CM

## 2020-10-29 DIAGNOSIS — I2582 Chronic total occlusion of coronary artery: Secondary | ICD-10-CM | POA: Insufficient documentation

## 2020-10-29 DIAGNOSIS — I2510 Atherosclerotic heart disease of native coronary artery without angina pectoris: Secondary | ICD-10-CM | POA: Diagnosis present

## 2020-10-29 HISTORY — PX: RIGHT/LEFT HEART CATH AND CORONARY/GRAFT ANGIOGRAPHY: CATH118267

## 2020-10-29 LAB — POCT I-STAT EG7
Acid-base deficit: 4 mmol/L — ABNORMAL HIGH (ref 0.0–2.0)
Bicarbonate: 21.2 mmol/L (ref 20.0–28.0)
Calcium, Ion: 1.18 mmol/L (ref 1.15–1.40)
HCT: 34 % — ABNORMAL LOW (ref 39.0–52.0)
Hemoglobin: 11.6 g/dL — ABNORMAL LOW (ref 13.0–17.0)
O2 Saturation: 70 %
Potassium: 3.8 mmol/L (ref 3.5–5.1)
Sodium: 143 mmol/L (ref 135–145)
TCO2: 22 mmol/L (ref 22–32)
pCO2, Ven: 40.2 mmHg — ABNORMAL LOW (ref 44.0–60.0)
pH, Ven: 7.33 (ref 7.250–7.430)
pO2, Ven: 39 mmHg (ref 32.0–45.0)

## 2020-10-29 LAB — POCT I-STAT 7, (LYTES, BLD GAS, ICA,H+H)
Acid-base deficit: 3 mmol/L — ABNORMAL HIGH (ref 0.0–2.0)
Bicarbonate: 22.3 mmol/L (ref 20.0–28.0)
Calcium, Ion: 1.27 mmol/L (ref 1.15–1.40)
HCT: 35 % — ABNORMAL LOW (ref 39.0–52.0)
Hemoglobin: 11.9 g/dL — ABNORMAL LOW (ref 13.0–17.0)
O2 Saturation: 100 %
Potassium: 4 mmol/L (ref 3.5–5.1)
Sodium: 142 mmol/L (ref 135–145)
TCO2: 24 mmol/L (ref 22–32)
pCO2 arterial: 41.3 mmHg (ref 32.0–48.0)
pH, Arterial: 7.34 — ABNORMAL LOW (ref 7.350–7.450)
pO2, Arterial: 241 mmHg — ABNORMAL HIGH (ref 83.0–108.0)

## 2020-10-29 LAB — POCT I-STAT, CHEM 8
BUN: 32 mg/dL — ABNORMAL HIGH (ref 8–23)
Calcium, Ion: 1.25 mmol/L (ref 1.15–1.40)
Chloride: 108 mmol/L (ref 98–111)
Creatinine, Ser: 1.6 mg/dL — ABNORMAL HIGH (ref 0.61–1.24)
Glucose, Bld: 105 mg/dL — ABNORMAL HIGH (ref 70–99)
HCT: 35 % — ABNORMAL LOW (ref 39.0–52.0)
Hemoglobin: 11.9 g/dL — ABNORMAL LOW (ref 13.0–17.0)
Potassium: 4 mmol/L (ref 3.5–5.1)
Sodium: 142 mmol/L (ref 135–145)
TCO2: 21 mmol/L — ABNORMAL LOW (ref 22–32)

## 2020-10-29 LAB — SARS CORONAVIRUS 2 BY RT PCR (HOSPITAL ORDER, PERFORMED IN ~~LOC~~ HOSPITAL LAB): SARS Coronavirus 2: NEGATIVE

## 2020-10-29 LAB — GLUCOSE, CAPILLARY: Glucose-Capillary: 134 mg/dL — ABNORMAL HIGH (ref 70–99)

## 2020-10-29 SURGERY — RIGHT/LEFT HEART CATH AND CORONARY/GRAFT ANGIOGRAPHY
Anesthesia: LOCAL

## 2020-10-29 MED ORDER — IOHEXOL 350 MG/ML SOLN
INTRAVENOUS | Status: AC
  Filled 2020-10-29: qty 1

## 2020-10-29 MED ORDER — ASPIRIN 81 MG PO CHEW
81.0000 mg | CHEWABLE_TABLET | ORAL | Status: AC
  Administered 2020-10-29: 81 mg via ORAL
  Filled 2020-10-29: qty 1

## 2020-10-29 MED ORDER — HEPARIN SODIUM (PORCINE) 1000 UNIT/ML IJ SOLN
INTRAMUSCULAR | Status: AC
  Filled 2020-10-29: qty 1

## 2020-10-29 MED ORDER — SODIUM CHLORIDE 0.9 % IV SOLN: 250.0000 mL | INTRAVENOUS | Status: DC | PRN

## 2020-10-29 MED ORDER — HYDRALAZINE HCL 20 MG/ML IJ SOLN: 10.0000 mg | INTRAMUSCULAR | Status: DC | PRN

## 2020-10-29 MED ORDER — HEPARIN (PORCINE) IN NACL 1000-0.9 UT/500ML-% IV SOLN
INTRAVENOUS | Status: DC | PRN
  Administered 2020-10-29: 500 mL

## 2020-10-29 MED ORDER — SODIUM CHLORIDE 0.9 % WEIGHT BASED INFUSION
1.0000 mL/kg/h | INTRAVENOUS | Status: DC
  Administered 2020-10-29: 1 mL/kg/h via INTRAVENOUS

## 2020-10-29 MED ORDER — FENTANYL CITRATE (PF) 100 MCG/2ML IJ SOLN
INTRAMUSCULAR | Status: DC | PRN
  Administered 2020-10-29: 50 ug via INTRAVENOUS

## 2020-10-29 MED ORDER — VERAPAMIL HCL 2.5 MG/ML IV SOLN
INTRAVENOUS | Status: AC
  Filled 2020-10-29: qty 2

## 2020-10-29 MED ORDER — IOHEXOL 350 MG/ML SOLN
INTRAVENOUS | Status: DC | PRN
  Administered 2020-10-29: 50 mL

## 2020-10-29 MED ORDER — HEPARIN SODIUM (PORCINE) 1000 UNIT/ML IJ SOLN
INTRAMUSCULAR | Status: DC | PRN
  Administered 2020-10-29: 4000 [IU] via INTRAVENOUS

## 2020-10-29 MED ORDER — SODIUM CHLORIDE 0.9% FLUSH: 3.0000 mL | Freq: Two times a day (BID) | INTRAVENOUS | Status: DC

## 2020-10-29 MED ORDER — MIDAZOLAM HCL 2 MG/2ML IJ SOLN
INTRAMUSCULAR | Status: AC
  Filled 2020-10-29: qty 2

## 2020-10-29 MED ORDER — FENTANYL CITRATE (PF) 100 MCG/2ML IJ SOLN
INTRAMUSCULAR | Status: AC
  Filled 2020-10-29: qty 2

## 2020-10-29 MED ORDER — LIDOCAINE HCL (PF) 1 % IJ SOLN
INTRAMUSCULAR | Status: DC | PRN
  Administered 2020-10-29 (×2): 2 mL
  Administered 2020-10-29: 15 mL

## 2020-10-29 MED ORDER — HEPARIN (PORCINE) IN NACL 1000-0.9 UT/500ML-% IV SOLN
INTRAVENOUS | Status: AC
  Filled 2020-10-29: qty 1500

## 2020-10-29 MED ORDER — LABETALOL HCL 5 MG/ML IV SOLN: 10.0000 mg | INTRAVENOUS | Status: DC | PRN

## 2020-10-29 MED ORDER — SODIUM CHLORIDE 0.9 % WEIGHT BASED INFUSION
3.0000 mL/kg/h | INTRAVENOUS | Status: AC
  Administered 2020-10-29: 3 mL/kg/h via INTRAVENOUS

## 2020-10-29 MED ORDER — RIVAROXABAN 15 MG PO TABS: 15.0000 mg | ORAL_TABLET | Freq: Every day | ORAL | 0 refills | Status: AC

## 2020-10-29 MED ORDER — SODIUM CHLORIDE 0.9% FLUSH: 3.0000 mL | INTRAVENOUS | Status: DC | PRN

## 2020-10-29 MED ORDER — MIDAZOLAM HCL 2 MG/2ML IJ SOLN
INTRAMUSCULAR | Status: DC | PRN
Start: 2020-10-29 — End: 2020-10-29
  Administered 2020-10-29: 1 mg via INTRAVENOUS

## 2020-10-29 MED ORDER — ONDANSETRON HCL 4 MG/2ML IJ SOLN: 4.0000 mg | Freq: Four times a day (QID) | INTRAMUSCULAR | Status: DC | PRN

## 2020-10-29 MED ORDER — VERAPAMIL HCL 2.5 MG/ML IV SOLN
INTRAVENOUS | Status: DC | PRN
  Administered 2020-10-29: 10 mL via INTRA_ARTERIAL

## 2020-10-29 MED ORDER — SODIUM CHLORIDE 0.9 % IV SOLN: INTRAVENOUS | Status: DC

## 2020-10-29 MED ORDER — ACETAMINOPHEN 325 MG PO TABS: 650.0000 mg | ORAL_TABLET | ORAL | Status: DC | PRN

## 2020-10-29 MED ORDER — LIDOCAINE HCL (PF) 1 % IJ SOLN
INTRAMUSCULAR | Status: AC
  Filled 2020-10-29: qty 30

## 2020-10-29 SURGICAL SUPPLY — 19 items
CATH BALLN WEDGE 5F 110CM (CATHETERS) ×2 IMPLANT
CATH INFINITI 5 FR IM (CATHETERS) ×2 IMPLANT
CATH INFINITI 5 FR LCB (CATHETERS) ×2 IMPLANT
CATH INFINITI 5FR AL1 (CATHETERS) ×2 IMPLANT
CATH INFINITI JR4 5F (CATHETERS) ×2 IMPLANT
CATH SWAN GANZ 7F STRAIGHT (CATHETERS) ×2 IMPLANT
DEVICE RAD COMP TR BAND LRG (VASCULAR PRODUCTS) ×2 IMPLANT
GLIDESHEATH SLEND A-KIT 6F 22G (SHEATH) ×2 IMPLANT
GUIDEWIRE .025 260CM (WIRE) ×2 IMPLANT
GUIDEWIRE INQWIRE 1.5J.035X260 (WIRE) ×1 IMPLANT
INQWIRE 1.5J .035X260CM (WIRE) ×2
KIT HEART LEFT (KITS) ×2 IMPLANT
KIT MICROPUNCTURE NIT STIFF (SHEATH) ×2 IMPLANT
PACK CARDIAC CATHETERIZATION (CUSTOM PROCEDURE TRAY) ×2 IMPLANT
SHEATH GLIDE SLENDER 4/5FR (SHEATH) ×4 IMPLANT
SHEATH PINNACLE 7F 10CM (SHEATH) ×2 IMPLANT
SHEATH PROBE COVER 6X72 (BAG) ×4 IMPLANT
TRANSDUCER W/STOPCOCK (MISCELLANEOUS) ×2 IMPLANT
TUBING CIL FLEX 10 FLL-RA (TUBING) ×2 IMPLANT

## 2020-10-29 NOTE — Interval H&P Note (Signed)
History and Physical Interval Note:  10/29/2020 11:34 AM  Dustin [name]  has presented today for surgery, with the diagnosis of chest pain,heart failure,CAD.  The various methods of treatment have been discussed with the patient and family. After consideration of risks, benefits and other options for treatment, the patient has consented to  Procedure(s): RIGHT/LEFT HEART CATH AND CORONARY/GRAFT ANGIOGRAPHY (N/A) as a surgical intervention.  The patient's history has been reviewed, patient examined, no change in status, stable for surgery.  I have reviewed the patient's chart and labs.  Questions were answered to the patient's satisfaction.    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

## 2020-10-29 NOTE — Progress Notes (Signed)
TR BAND REMOVAL  LOCATION:    right radial  DEFLATED PER PROTOCOL:    Yes.    TIME BAND OFF / DRESSING APPLIED:    1600   SITE UPON ARRIVAL:    Level 0  SITE AFTER BAND REMOVAL:    Level 0  CIRCULATION SENSATION AND MOVEMENT:    Within Normal Limits   Yes.    COMMENTS:    

## 2020-10-29 NOTE — H&P (Signed)
OV 09/26/2020 copied, with addendum       Subjective:   Dustin [name], male    DOB: Oct 31, 1940, 80 y.o.   MRN: [id number]   Chief complaint:  Aortic stenosis   HPI  80 year-old [place] American male with hypertension, hyperlipidemia, hyperlipidemia, type 2 DM, coronary artery disease s/p CABG 1992, PCI is in 2000, 2001, moderate MR, PAD s/p prior iliac stents, persistent Afib on Xarelto, OSA on CPAP, CKD 3, GERD, h/o MGUS followed by Dr. 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   Patient is here today with his wife and daughter. He has continued to have significant symptoms of exertional dyspnea with minimal activity-such as tying his shoe laces, or merely walking the hallway to get to the appt. He also reports "pain between his shoulder blades", which is worse after eating and subsequently walking. 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 Patient states that this pain is similar to his symptoms prior to his CABG. Patient has previously had severe side effects with nuclear stress test and does not want to undergo nuclear stress test. He also does not want to try any new medications at this time. Reviewed echocardiogram performed earlier today with the patient and family.   No current facility-administered medications on file prior to encounter.   Current Outpatient Medications on File Prior to Encounter  Medication Sig Dispense Refill  . acetaminophen (TYLENOL) 650 MG CR tablet Take 650 mg by mouth every 8 (eight) hours as needed (for mild pain).    . Cholecalciferol (VITAMIN D3) 125 MCG (5000 UT) TABS Take 10,000 Units by mouth daily in the afternoon.    . Insulin Glargine, 1 Unit Dial, (TOUJEO SOLOSTAR) 300 UNIT/ML SOPN Inject 50 Units into the skin at bedtime. (Patient taking differently: Inject 25 Units into the skin at bedtime.) 4.5  mL 2  . isosorbide mononitrate (IMDUR) 120 MG 24 hr tablet Take 2 tablets (240 mg total) by mouth daily. (Patient taking differently: Take 120 mg by mouth in the morning.) 180 tablet 3  . metoprolol (TOPROL-XL) 200 MG 24 hr tablet Take 200 mg by mouth in the morning.    . Potassium Chloride ER 20 MEQ TBCR TAKE 1 TABLET BY MOUTH TWICE DAILY (Patient taking differently: Take 20 mEq by mouth 2 (two) times daily.) 60 tablet 3  . tamsulosin (FLOMAX) 0.4 MG CAPS capsule Take 0.4 mg by mouth in the morning and at bedtime.  3  . Continuous Blood Gluc Sensor (FREESTYLE LIBRE 2 SENSOR) MISC Inject 1 patch into the skin every 14 (fourteen) days.    . furosemide (LASIX) 40 MG tablet TAKE 1 TABLET(40 MG) BY MOUTH TWICE DAILY (Patient taking differently: Take 40 mg by mouth 2 (two) times daily.) 180 tablet 1  . JARDIANCE 10 MG TABS tablet Take 10 mg by mouth in the morning and at bedtime.    . Magnesium 250 MG TABS Take 250 mg by mouth in the morning and at bedtime.    . Rivaroxaban (XARELTO) 15 MG TABS tablet Take 15 mg by mouth at bedtime.    . rosuvastatin (CRESTOR) 20 MG tablet TAKE 1 TABLET BY MOUTH EVERY DAY (Patient taking differently: Take 20 mg by mouth at bedtime.) 30 tablet 3  . timolol (TIMOPTIC) 0.5 % ophthalmic solution Place 1 drop into both eyes in the morning.      Cardiovascular  studies:  Echocardiogram 09/26/2020: Left ventricle cavity is normal in size. Moderate concentric hypertrophy of the left ventricle. Abnormal septal wall motion due to post-operative coronary artery bypass graft. Normal LV systolic function with visual EF 50-55%. Doppler evidence of grade II (pseudonormal) diastolic dysfunction, elevated LAP.  Left atrial cavity is mildly dilated. Trileaflet aortic valve with moderate aortic valve leaflet calcification..   Mild (Grade I) aortic regurgitation. Vmax 1.6 m/sec, mean PG 6 mmHg, AVA 1.7 cm2 by continuity equation. Mild calcification of the mitral valve annulus. Moderate  (Grade II), eccentric, posteriorly directed mitral regurgitation. Mild tricuspid regurgitation.  No evidence of pulmonary hypertension. No significant change compared to previous study on 03/14/2020.   MRA abdomen 09/14/2020: Abdominal MRA:  1. The suprarenal abdominal aorta is normal in size and morphology. Infrarenal stent with artifact  precludes complete evaluation of most of the infrarenal abdominal aorta. Stent patency not assessed on this  exam.  2. 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 The SMA is patent without visible significant stenosis. The IMA is not well  visualized due to adjacent stent in the infrarenal aorta.   3. There is mild stenosis (<30%) in the bilateral renal artery ostia.  4. Cholelithiasis.    EKG 06/26/2020: Atrial fibrillation 59 bpm  Inferolateral T wave inversion, consider ischemia  Carotid artery duplex  08/30/2018: Mild mixed plaque bilateral carotid arteries without significant stenosis. Antegrade right vertebral artery flow. Left vertebral artery waveform shows high resistance and may suggest distal stenosis.  Antegrade left vertebral artery flow. Overall no significant change from 11/10/2017.  Peripheral angiography and intervention 12/08/2017 & 12/15/2017: Abdominal aorta: Severe ectatic 80% stenosis distal aorta bifurcation-->Successful PTA Rt CIA and covered stent placement Infrarenal abdominal aorta (Viabahn 10.0 X 39 mm covered stent) Bilateral renal arteries: Normal Rt renal artery. Lt renal artery with moderate diffuse disease (limited visualization with CO2) Lt CIA 60% stenosis-->successful PTA with 0% residual stenosis Rt CIA severe 80% restenosis (prior stent)-->successful PTA Mustang 6.0 mm balloon.  Mild restenosis Lt CIA  Lt EIA with distal 95% stenosis, successful drug coated PTA 6.0 X 120 mm In.Pact with 0% residual stenosis Lt common femoral  artery 80% stenosis, successful drug coated PTA 6.0 X 120 mm In.Pact with 30% residual stenosis Bilateral SFA: Prox occluded with collaterals seen  Coronary/bnypass graft angiography 08/18/2017: LM: 100% LAD Prox 100%, mid 50%, D1 100% LCx: 100%, OM2 90% RCA: Prox 100%, distal 100%, PDA 100% LIMA-LAD: Patent SVG-D1 100% SVG-RCA: 100% SVG-OM2: 20%  LE arterial ultrasound 01/13/2018: Final Interpretation: Right: Significant plaque morphology and dampened waveforms in the right lower extremity suggest peripheral vascular disease with possible proximal obstruction. There is no obvious evidence of hemodynamically significant stenosis involving the right  lower extremity arteries.    Left: Significant plaque morphology and dampened waveforms in the left lower extremity suggest peripheral vascular disease with possible proximal obstruction. The superficial femoral artery is occluded in its mid to distal segments with reconstitution at  the popliteal artery.  Lower extremity venous duplex 06/02/2018: No DVT seen  Carotid artery duplex  08/30/2018 Mild mixed plaque bilateral carotid arteries without significant stenosis. Antegrade right vertebral artery flow. Left vertebral artery waveform shows high resistance and may suggest distal stenosis.  Antegrade left vertebral artery flow. Overall no significant change from 11/10/2017.  Recent labs: 09/14/2020: Glucose 200, BUN/Cr 36/1.8. EGFR 38. Na/K 137/3.9. Albumin 3.4. Rest of the CMP normal Pro BNP 2858 (<  850) H/H 12.8/40. MCV 93. Platelets 145 Chol 161, TG 284, HDL 30, LDL 74  06/26/2020: Glucose 205, BUN/Cr 28/1/86. EGFR 36. Na/K 134/4.6. Rest of the CMP normal H/H 14/41. MCV 91. Platelets 174  Results for HALIL, RENTZ (MRN [id number]) as of 07/26/2020 12:54  Ref. Range 06/26/2020 15:28  Troponin I (High Sensitivity) Latest Ref Range: <18 ng/L 14    01/18/2020: Glucose 203, BUN/Cr 22/1.64. EGFR 45. Na/K 136/4.6. Rest of the CMP  normal H/H 12.5/37. MCV 93. Platelets 255 HbA1C 10.5% Chol 162, TG 128, HDL 37, LDL 98  04/25/2019: H/H 12.5/37. MCV 93. Platelets 255  01/28/2019: Glucose 272, BUN/Cr 23/1.42. EGFR 53. Na/K 137/4.7. Rest of the CMP normal H/H 12.5/36.7. MCV 93.4. Platelets 108     Review of Systems  Cardiovascular: Positive for chest pain and dyspnea on exertion (Improved). Negative for claudication, leg swelling, palpitations and syncope.  Hematologic/Lymphatic: Does not bruise/bleed easily.  Musculoskeletal: Positive for joint pain.  All other systems reviewed and are negative.    Vitals:   10/29/20 0605  BP: 131/68  Pulse: 62  Resp: 18  Temp: 98.2 F (36.8 C)  SpO2: 100%      Objective:    Physical Exam Vitals and nursing note reviewed.  Constitutional:      General: He is not in acute distress.    Appearance: He is well-developed.  Neck:     Vascular: No JVD.  Cardiovascular:     Rate and Rhythm: Normal rate. Rhythm irregular.     Pulses:          Femoral pulses are 1+ on the right side and 1+ on the left side.      Dorsalis pedis pulses are 0 on the right side and 0 on the left side.       Posterior tibial pulses are 0 on the right side and 0 on the left side.     Heart sounds: Murmur heard.   Harsh midsystolic murmur is present with a grade of 3/6 at the upper right sternal border radiating to the neck.   Pulmonary:     Effort: Pulmonary effort is normal.     Breath sounds: Normal breath sounds. No wheezing or rales.  Musculoskeletal:     Right lower leg: No edema.     Left lower leg: No edema.  Neurological:     Mental Status: He is alert and oriented to person, place, and time.            Assessment & Recommendations:   80 year old [place] American male with hypertension, hyperlipidemia, hyperlipidemia, type 2 DM, coronary artery disease s/p CABG 1992, PCI is in 2000, 2001, moderate MR, PAD s/p prior iliac stents, persistent Afib on Xarelto, OSA on CPAP,  CKD 3, GERD, h/o MGUS followed by Dr. 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   CAD with stable angina: I suspect his exertional shoulder/back pain is angina equivalent.  There is no significant suprarenal aorta abnormalities on recent MRI abdomen performed at [facility].  Consequently, exertional dyspnea and elevated proBNP, and grade 2 diastolic dysfunction on echocardiogram point towards HFpEF.  In addition to continuing current medical management, including antianginal therapy, diuresis with Lasix, reevaluation for obstructive CAD is necessary.  Patient does not want to undergo nuclear stress test based on his previous experience of side effects.  Daughter asked about stress perfusion MRI.  I explained that this can be performed in the hospital,  as we do not have capability to perform this outpatient.  However, definitive test would be invasive angiography and possible intervention.  Significant risk of contrast-induced nephropathy remains given his GFR of 38.  However, based on prior report from [place] in 2019, his only patent vessels are LIMA-LAD and SVG-OM2.  I would recommend limited angiography to minimize contrast use.  I would also recommend hydration and/or Lasix use based on filling pressures obtained on left heart catheterization, as well as right heart catheterization.  In the setting the patient is reluctant to take any additional antianginal medications and is well optimized on current antianginal regimen, next best option is possible intervention, should there be any obstructive disease in LIMA-LAD or SVG-OM2.  I will perform the procedure through left radial artery and left brachial vein.  Given his significant aortoiliac disease, I would like to avoid femoral access, if possible.  Patient and family understand all risks, benefits, alternate options.  They would like to think about it and inform me, if and when they would like  to proceed with the above.   Hypertension: Well-controlled.  Persistent Afib:  CHA2DS2VAsc score 4, annual stroke risk 5%. Controlled ventricular rate. Continue anticoagulation with Xarelto 15 mg daily.   Mild carotid stenosis: Continue medical maangement  Mild aortic stenosis, moderate mitral regurgitation: Relatively unchanged, continue surveillance.  F/u in 4 weeks  [place], MD [facility] Pager: [phone] Office: [phone] If no answer Cell [phone]   ----- Tel encounter 10/11/2020: Left and right heart catheterization, coronary angiography, and possible intervention is currently scheduled for 10/16/2020.  On reviewing recent labs, I noticed that his platelet count is down to 96, previously noted to be 187 in 05/2020, and 155 in 08/2020.  Patient has known MGUS, last seen by hematologist Dr. Burr Medico in 05/2020.  He is known to have lytic lesions in L1 and pelvic bone in 10/2017.  Dr. Burr Medico had reviewed CBC, CMP to be in normal limits.  She had recommended repeat bone scan in 06/2020.  It does not appear that patient underwent this bone scan.  Given lack of repeat bone scan, and now drop in platelet count, I am concerned about his bleeding risk prior to committing him to coronary stent placement and addition of Plavix (patient is already on Xarelto), prior to getting Dr. Ernestina Penna opinion about it.  I propose the following to the patient and wife I will perform diagnostic coronary and bypass graft angiogram, left heart catheterization and right heart catheterization on 10/16/2020.  I will get Dr. Ernestina Penna opinion regarding his bleeding risk.  Based on bleeding risk, should he have any obstructive coronary artery disease, will staged intervention at a later date.  Both patient and wife are in agreement.  I will forward my note to Dr. Annamaria Boots and seek her opinion.   -------  After conforming with Dr Burr Medico re: stability of his MGUS, patient wished  to proceed with LHC/RHC. Scheduled for today.   Nigel Mormon, MD Pager: [phone] Office: [phone]

## 2020-10-29 NOTE — Progress Notes (Signed)
Patient ambulated to bathroom.  No c/o pain.  Right groin level O.  Left radial level 0

## 2020-10-30 ENCOUNTER — Encounter (HOSPITAL_COMMUNITY): Payer: Self-pay | Admitting: Cardiology

## 2020-11-02 MED FILL — Heparin Sod (Porcine)-NaCl IV Soln 1000 Unit/500ML-0.9%: INTRAVENOUS | Qty: 500 | Status: AC

## 2020-11-08 ENCOUNTER — Ambulatory Visit: Payer: Medicare Other | Admitting: Cardiology

## 2020-11-17 ENCOUNTER — Other Ambulatory Visit: Payer: Self-pay | Admitting: Cardiology

## 2020-11-17 DIAGNOSIS — I340 Nonrheumatic mitral (valve) insufficiency: Secondary | ICD-10-CM

## 2020-11-21 NOTE — Progress Notes (Addendum)
Subjective:   Dustin [name], male    DOB: 10/25/1940, 80 y.o.   MRN: [id number]   Chief complaint:  Aortic stenosis   HPI  80 year-old [place] American male with hypertension, hyperlipidemia, hyperlipidemia, type 2 DM, coronary artery disease s/p CABG 1992, PCI is in 2000, 2001, moderate MR, PAD s/p prior iliac stents, persistent Afib on Xarelto, OSA on CPAP, CKD 3, GERD, h/o MGUS followed by Dr. 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   Patient underwent coronary bypass graft angiography in 10/2020 that showed LIMA-LAD is the only functioning graft/vessel.  Patient continues to have episodes of pain between his shoulder blades with activity, that improved with rest.  Episodes do not occur every day.  He feels tired and fatigued with these episodes.   Current Outpatient Medications on File Prior to Visit  Medication Sig Dispense Refill   acetaminophen (TYLENOL) 650 MG CR tablet Take 650 mg by mouth every 8 (eight) hours as needed (for mild pain).     Cholecalciferol (VITAMIN D3) 125 MCG (5000 UT) TABS Take 10,000 Units by mouth daily in the afternoon.     Continuous Blood Gluc Sensor (FREESTYLE LIBRE 2 SENSOR) MISC Inject 1 patch into the skin every 14 (fourteen) days.     furosemide (LASIX) 40 MG tablet TAKE 1 TABLET(40 MG) BY MOUTH TWICE DAILY 180 tablet 1   Insulin Glargine, 1 Unit Dial, (TOUJEO SOLOSTAR) 300 UNIT/ML SOPN Inject 50 Units into the skin at bedtime. (Patient taking differently: Inject 25 Units into the skin at bedtime.) 4.5 mL 2   isosorbide mononitrate (IMDUR) 120 MG 24 hr tablet Take 2 tablets (240 mg total) by mouth daily. (Patient taking differently: Take 120 mg by mouth in the morning.) 180 tablet 3   JARDIANCE 10 MG TABS tablet Take 10 mg by mouth in the morning and at bedtime.     Magnesium 250 MG TABS Take 250 mg by mouth in the morning and at bedtime.     metoprolol (TOPROL-XL) 200 MG  24 hr tablet Take 200 mg by mouth in the morning.     Potassium Chloride ER 20 MEQ TBCR TAKE 1 TABLET BY MOUTH TWICE DAILY (Patient taking differently: Take 20 mEq by mouth 2 (two) times daily.) 60 tablet 3   Rivaroxaban (XARELTO) 15 MG TABS tablet Take 1 tablet (15 mg total) by mouth at bedtime. Resume on 10/30/2020 1 tablet 0   rosuvastatin (CRESTOR) 20 MG tablet TAKE 1 TABLET BY MOUTH EVERY DAY (Patient taking differently: Take 20 mg by mouth at bedtime.) 30 tablet 3   tamsulosin (FLOMAX) 0.4 MG CAPS capsule Take 0.4 mg by mouth in the morning and at bedtime.  3   timolol (TIMOPTIC) 0.5 % ophthalmic solution Place 1 drop into both eyes in the morning.     No current facility-administered medications on file prior to visit.    Cardiovascular studies:  Coronary/bypass graft angiography 10/29/2020: LM: 100% occluded in mid vessel LAD: 100% occluded (at [facility]) LCx: 100% occluded (at [facility]) RCA: Known 100% occluded (not engaged today) LIMA-LAD: Patent. No stenosis SVG-OM2: Ostial 90% stenosis, followed by 100% occlusion SVG-Diag: 100% ostially occluded SVG-RCA: Known 100% occluded (not engaged today)   Entire cardiac perfusion is through LIMA-LAD, providing minimal collaterals to distal RCA and distal OM branches   No target for revascularization. Continue medical management.    Echocardiogram 09/26/2020: Left ventricle cavity is normal in size. Moderate  concentric hypertrophy of the left ventricle. Abnormal septal wall motion due to post-operative coronary artery bypass graft. Normal LV systolic function with visual EF 50-55%. Doppler evidence of grade II (pseudonormal) diastolic dysfunction, elevated LAP.  Left atrial cavity is mildly dilated. Trileaflet aortic valve with moderate aortic valve leaflet calcification..   Mild (Grade I) aortic regurgitation. Vmax 1.6 m/sec, mean PG 6 mmHg, AVA 1.7 cm2 by continuity equation. Mild calcification of the mitral valve annulus. Moderate (Grade II),  eccentric, posteriorly directed mitral regurgitation. Mild tricuspid regurgitation.  No evidence of pulmonary hypertension. No significant change compared to previous study on 03/14/2020.   MRA abdomen 09/14/2020: Abdominal MRA:  1. The suprarenal abdominal aorta is normal in size and morphology.  Infrarenal stent with artifact  precludes complete evaluation of most of the infrarenal abdominal aorta. Stent patency not assessed on this  exam.  2. 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 The SMA is patent without visible significant stenosis. The IMA is not well  visualized due to adjacent stent in the infrarenal aorta.    3.  There is mild stenosis (<30%) in the bilateral renal artery ostia.  4. Cholelithiasis.    EKG 06/26/2020: Atrial fibrillation 59 bpm  Inferolateral T wave inversion, consider ischemia  Carotid artery duplex  08/30/2018: Mild mixed plaque bilateral carotid arteries without significant stenosis. Antegrade right vertebral artery flow. Left vertebral artery waveform shows high resistance and may suggest distal stenosis.  Antegrade left vertebral artery flow. Overall no significant change from 11/10/2017.  Peripheral angiography and intervention 12/08/2017 & 12/15/2017: Abdominal aorta: Severe ectatic 80% stenosis distal aorta bifurcation-->Successful PTA Rt CIA and covered stent placement Infrarenal abdominal aorta (Viabahn 10.0 X 39 mm covered stent) Bilateral renal arteries: Normal Rt renal artery. Lt renal artery with moderate diffuse disease (limited visualization with CO2) Lt CIA 60% stenosis-->successful PTA with 0% residual stenosis Rt CIA severe 80% restenosis (prior stent)-->successful PTA Mustang 6.0 mm balloon.  Mild restenosis Lt CIA  Lt EIA with distal 95% stenosis, successful drug coated PTA 6.0 X 120 mm In.Pact with 0% residual stenosis Lt common femoral artery 80%  stenosis, successful drug coated PTA 6.0 X 120 mm In.Pact with 30% residual stenosis Bilateral SFA: Prox occluded with collaterals seen  Coronary/bnypass graft angiography 08/18/2017: LM: 100% LAD Prox 100%, mid 50%, D1 100% LCx: 100%, OM2 90% RCA: Prox 100%, distal 100%, PDA 100% LIMA-LAD: Patent SVG-D1 100% SVG-RCA: 100% SVG-OM2: 20%  LE arterial ultrasound 01/13/2018: Final Interpretation: Right: Significant plaque morphology and dampened waveforms in the right lower extremity suggest peripheral vascular disease with possible proximal obstruction. There is no obvious evidence of hemodynamically significant stenosis involving the right  lower extremity arteries.    Left: Significant plaque morphology and dampened waveforms in the left lower extremity suggest peripheral vascular disease with possible proximal obstruction. The superficial femoral artery is occluded in its mid to distal segments with reconstitution at  the popliteal artery.  Lower extremity venous duplex 06/02/2018: No DVT seen  Carotid artery duplex  08/30/2018 Mild mixed plaque bilateral carotid arteries without significant stenosis. Antegrade right vertebral artery flow. Left vertebral artery waveform shows high resistance and may suggest distal stenosis.  Antegrade left vertebral artery flow. Overall no significant change from 11/10/2017.  Recent labs: 10/29/2020: Glucose 105, BUN/Cr 32/1.6. Na/K 142/4.0.   09/14/2020: Glucose 200, BUN/Cr 36/1.8. EGFR 38. Na/K 137/3.9. Albumin 3.4. Rest of the CMP normal Pro BNP 2858 (<  850) H/H 12.8/40. MCV 93. Platelets 145 Chol 161, TG 284, HDL 30, LDL 74  06/26/2020: Glucose 205, BUN/Cr 28/1/86. EGFR 36. Na/K 134/4.6. Rest of the CMP normal H/H 14/41. MCV 91. Platelets 174  Results for ELENO, WEIMAR (MRN [id number]) as of 07/26/2020 12:54  Ref. Range 06/26/2020 15:28  Troponin I (High Sensitivity) Latest Ref Range: <18 ng/L 14    01/18/2020: Glucose 203, BUN/Cr  22/1.64. EGFR 45. Na/K 136/4.6. Rest of the CMP normal H/H 12.5/37. MCV 93. Platelets 255 HbA1C 10.5% Chol 162, TG 128, HDL 37, LDL 98  04/25/2019: H/H 12.5/37. MCV 93. Platelets 255  01/28/2019: Glucose 272, BUN/Cr 23/1.42. EGFR 53. Na/K 137/4.7. Rest of the CMP normal H/H 12.5/36.7. MCV 93.4. Platelets 108     Review of Systems  Cardiovascular: Positive for chest pain and dyspnea on exertion (Improved). Negative for claudication, leg swelling, palpitations and syncope.  Hematologic/Lymphatic: Does not bruise/bleed easily.  Musculoskeletal: Positive for joint pain.  All other systems reviewed and are negative.    Vitals:   11/22/20 1110  BP: 118/61  Pulse: 66  Resp: 16  Temp: 98.5 F (36.9 C)  SpO2: 98%      Objective:    Physical Exam Vitals and nursing note reviewed.  Constitutional:      General: He is not in acute distress.    Appearance: He is well-developed.  Neck:     Vascular: No JVD.  Cardiovascular:     Rate and Rhythm: Normal rate. Rhythm irregular.     Pulses:          Femoral pulses are 1+ on the right side and 1+ on the left side.      Dorsalis pedis pulses are 0 on the right side and 0 on the left side.       Posterior tibial pulses are 0 on the right side and 0 on the left side.     Heart sounds: Murmur heard.   Harsh midsystolic murmur is present with a grade of 3/6 at the upper right sternal border radiating to the neck.   Pulmonary:     Effort: Pulmonary effort is normal.     Breath sounds: Normal breath sounds. No wheezing or rales.  Musculoskeletal:     Right lower leg: No edema.     Left lower leg: No edema.  Neurological:     Mental Status: He is alert and oriented to person, place, and time.            Assessment & Recommendations:   80 year old [place] American male with hypertension, hyperlipidemia, hyperlipidemia, type 2 DM, coronary artery disease s/p CABG 1992, PCI is in 2000, 2001, moderate MR, PAD s/p prior iliac  stents, persistent Afib on Xarelto, OSA on CPAP, CKD 3, GERD, h/o MGUS followed by Dr. 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   CAD with stable angina: Severe native vessel and bypass graft disease with LIMA-LAD being the only functioning graft/vessel. There is no target for revascularization. He is on good medical therapy.  Today, we discussed addition of Ranexa and/or referral for EECP.  Given his severe PAD, as well as atrial fibrillation, he is unlikely to be a good candidate for EECP.  Ranexa 500 mg bid is a reasonable option, provided we can monitor his renal function very closely.  Today, I gave some additional reading material to patient and his wife regarding the next.  They want to think about  this option.  At this time, I would like to continue with current medical therapy without any change.  Hypertension: Well-controlled.  Persistent Afib:  CHA2DS2VAsc score 4, annual stroke risk 5%. Controlled ventricular rate. Continue anticoagulation with Xarelto 15 mg daily.   Mild carotid stenosis: Continue medical maangement  Mild aortic stenosis, moderate mitral regurgitation: Relatively unchanged, continue surveillance.  F/u in 3 months  [place], MD [facility] Pager: [phone] Office: [phone] If no answer Cell [phone]

## 2020-11-22 ENCOUNTER — Encounter: Payer: Self-pay | Admitting: Cardiology

## 2020-11-22 ENCOUNTER — Other Ambulatory Visit: Payer: Self-pay

## 2020-11-22 ENCOUNTER — Ambulatory Visit: Payer: Medicare Other | Admitting: Cardiology

## 2020-11-22 VITALS — BP 118/61 | HR 66 | Temp 98.5°F | Resp 16 | Ht 66.0 in | Wt 184.0 lb

## 2020-11-22 DIAGNOSIS — I739 Peripheral vascular disease, unspecified: Secondary | ICD-10-CM

## 2020-11-22 DIAGNOSIS — I340 Nonrheumatic mitral (valve) insufficiency: Secondary | ICD-10-CM

## 2020-11-22 DIAGNOSIS — I25708 Atherosclerosis of coronary artery bypass graft(s), unspecified, with other forms of angina pectoris: Secondary | ICD-10-CM

## 2020-11-22 DIAGNOSIS — I350 Nonrheumatic aortic (valve) stenosis: Secondary | ICD-10-CM

## 2020-11-22 NOTE — Patient Instructions (Signed)
Ranolazine tablets, extended release What is this medicine? RANOLAZINE (ra NOE la zeen) is a heart medicine. It is used to treat chronic chest pain (angina). This medicine must be taken regularly. It will not relieve an acute episode of chest pain. This medicine may be used for other purposes; ask your health care provider or pharmacist if you have questions. COMMON BRAND NAME(S): Ranexa What should I tell my health care provider before I take this medicine? They need to know if you have any of these conditions:  heart disease  irregular heartbeat  kidney disease  liver disease  low levels of potassium or magnesium in the blood  an unusual or allergic reaction to ranolazine, other medicines, foods, dyes, or preservatives  pregnant or trying to get pregnant  breast-feeding How should I use this medicine? Take this medicine by mouth with a glass of water. Follow the directions on the prescription label. Do not cut, crush, or chew this medicine. Take with or without food. Do not take this medication with grapefruit juice. Take your doses at regular intervals. Do not take your medicine more often then directed. Talk to your pediatrician regarding the use of this medicine in children. Special care may be needed. Overdosage: If you think you have taken too much of this medicine contact a poison control center or emergency room at once. NOTE: This medicine is only for you. Do not share this medicine with others. What if I miss a dose? If you miss a dose, take it as soon as you can. If it is almost time for your next dose, take only that dose. Do not take double or extra doses. What may interact with this medicine? Do not take this medicine with any of the following medications:  antivirals for HIV or AIDS  cerivastatin  certain antibiotics like chloramphenicol, clarithromycin, dalfopristin; quinupristin, isoniazid, rifabutin, rifampin, rifapentine  certain medicines used for cancer  like imatinib, nilotinib  certain medicines for fungal infections like fluconazole, itraconazole, ketoconazole, posaconazole, voriconazole  certain medicines for irregular heart beat like dronedarone  certain medicines for seizures like carbamazepine, fosphenytoin, oxcarbazepine, phenobarbital, phenytoin  cisapride  conivaptan  cyclosporine  grapefruit or grapefruit juice  lumacaftor; ivacaftor  nefazodone  pimozide  quinacrine  St John's wort  thioridazine This medicine may also interact with the following medications:  alfuzosin  certain medicines for depression, anxiety, or psychotic disturbances like bupropion, citalopram, fluoxetine, fluphenazine, paroxetine, perphenazine, risperidone, sertraline, trifluoperazine  certain medicines for cholesterol like atorvastatin, lovastatin, simvastatin  certain medicines for stomach problems like octreotide, palonosetron, prochlorperazine  eplerenone  ergot alkaloids like dihydroergotamine, ergonovine, ergotamine, methylergonovine  metformin  nicardipine  other medicines that prolong the QT interval (cause an abnormal heart rhythm) like dofetilide, ziprasidone  sirolimus  tacrolimus This list may not describe all possible interactions. Give your health care provider a list of all the medicines, herbs, non-prescription drugs, or dietary supplements you use. Also tell them if you smoke, drink alcohol, or use illegal drugs. Some items may interact with your medicine. What should I watch for while using this medicine? Visit your doctor for regular check ups. Tell your doctor or healthcare professional if your symptoms do not start to get better or if they get worse. This medicine will not relieve an acute attack of angina or chest pain. This medicine can change your heart rhythm. Your health care provider may check your heart rhythm by ordering an electrocardiogram (ECG) while you are taking this medicine. You may get drowsy  or  dizzy. Do not drive, use machinery, or do anything that needs mental alertness until you know how this medicine affects you. Do not stand or sit up quickly, especially if you are an older patient. This reduces the risk of dizzy or fainting spells. Alcohol may interfere with the effect of this medicine. Avoid alcoholic drinks. If you are scheduled for any medical or dental procedure, tell your healthcare provider that you are taking this medicine. This medicine can interact with other medicines used during surgery. What side effects may I notice from receiving this medicine? Side effects that you should report to your doctor or health care professional as soon as possible:  allergic reactions like skin rash, itching or hives, swelling of the face, lips, or tongue  breathing problems  changes in vision  fast, irregular or pounding heartbeat  feeling faint or lightheaded, falls  low or high blood pressure  numbness or tingling feelings  ringing in the ears  tremor or shakiness  slow heartbeat (fewer than 50 beats per minute)  swelling of the legs or feet Side effects that usually do not require medical attention (report to your doctor or health care professional if they continue or are bothersome):  constipation  drowsy  dry mouth  headache  nausea or vomiting  stomach upset This list may not describe all possible side effects. Call your doctor for medical advice about side effects. You may report side effects to FDA at 1-800-FDA-1088. Where should I keep my medicine? Keep out of the reach of children. Store at room temperature between 15 and 30 degrees C (59 and 86 degrees F). Throw away any unused medicine after the expiration date. NOTE: This sheet is a summary. It may not cover all possible information. If you have questions about this medicine, talk to your doctor, pharmacist, or health care provider.  2021 Elsevier/Gold Standard (2018-06-08 09:18:49)

## 2020-11-26 ENCOUNTER — Encounter: Payer: Self-pay | Admitting: Hematology

## 2020-11-28 ENCOUNTER — Ambulatory Visit: Payer: Medicare Other | Admitting: Cardiology

## 2020-12-12 ENCOUNTER — Other Ambulatory Visit: Payer: Medicare Other

## 2021-01-08 NOTE — Telephone Encounter (Signed)
From pt

## 2021-01-08 NOTE — Telephone Encounter (Signed)
Look forward. Staff, please forward Dr. Nathanial Millman call to my cell phone. If I am in a procedure and miss his call, I will call back.  Thanks MJP

## 2021-01-08 NOTE — Telephone Encounter (Signed)
Please call.

## 2021-01-08 NOTE — Telephone Encounter (Signed)
From patient.

## 2021-01-08 NOTE — Telephone Encounter (Signed)
The number to Dr. Sharlet Salina is [phone]

## 2021-01-28 NOTE — Telephone Encounter (Signed)
From pt

## 2021-02-05 ENCOUNTER — Encounter (HOSPITAL_COMMUNITY): Payer: Self-pay | Admitting: Cardiology

## 2021-02-05 NOTE — Telephone Encounter (Signed)
From pt

## 2021-02-05 NOTE — Telephone Encounter (Signed)
Spoke with the patient's wife. I will update them after I speak with Dr. Sharlet Salina.  Thanks MJP

## 2021-02-22 ENCOUNTER — Ambulatory Visit: Payer: Medicare Other | Admitting: Cardiology

## 2021-02-22 ENCOUNTER — Encounter: Payer: Self-pay | Admitting: Cardiology

## 2021-02-22 ENCOUNTER — Other Ambulatory Visit: Payer: Self-pay

## 2021-02-22 VITALS — BP 127/69 | HR 62 | Temp 98.0°F | Resp 16 | Ht 66.0 in | Wt 184.0 lb

## 2021-02-22 DIAGNOSIS — I739 Peripheral vascular disease, unspecified: Secondary | ICD-10-CM

## 2021-02-22 DIAGNOSIS — I4819 Other persistent atrial fibrillation: Secondary | ICD-10-CM

## 2021-02-22 DIAGNOSIS — I340 Nonrheumatic mitral (valve) insufficiency: Secondary | ICD-10-CM

## 2021-02-22 DIAGNOSIS — I350 Nonrheumatic aortic (valve) stenosis: Secondary | ICD-10-CM

## 2021-02-22 DIAGNOSIS — I5033 Acute on chronic diastolic (congestive) heart failure: Secondary | ICD-10-CM

## 2021-02-22 DIAGNOSIS — I25708 Atherosclerosis of coronary artery bypass graft(s), unspecified, with other forms of angina pectoris: Secondary | ICD-10-CM

## 2021-02-22 MED ORDER — FUROSEMIDE 40 MG PO TABS: 40.0000 mg | ORAL_TABLET | ORAL | 1 refills | Status: DC

## 2021-02-22 NOTE — Progress Notes (Signed)
Subjective:   Dustin [name], male    DOB: 1940-11-18, 80 y.o.   MRN: [id number]   Chief complaint:  Aortic stenosis   HPI  80 year-old [place] American male with hypertension, hyperlipidemia, hyperlipidemia, type 2 DM, coronary artery disease s/p CABG 1992, PCI is in 2000, 2001, moderate MR, PAD s/p prior iliac stents, persistent Afib on Xarelto, OSA on CPAP, CKD 3, GERD, h/o MGUS followed by Dr. 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   Patient underwent coronary bypass graft angiography in 10/2020 that showed LIMA-LAD is the only functioning graft/vessel.  Patient continues to have episodes of pain between his shoulder blades with activity, that improved with rest.  In addition, he has also had worsening exertional dyspnea symptoms.  Patient saw cardiologist Dr. Sharlet Salina at [facility] for consideration for EECP. His Afib and PAD are limiting factors for EECP. In addition, Dr. Sharlet Salina also wondered if the LAD stent is open, which it is.    Current Outpatient Medications on File Prior to Visit  Medication Sig Dispense Refill   acetaminophen (TYLENOL) 650 MG CR tablet Take 650 mg by mouth every 8 (eight) hours as needed (for mild pain).     Cholecalciferol (VITAMIN D3) 125 MCG (5000 UT) TABS Take 10,000 Units by mouth daily in the afternoon.     Continuous Blood Gluc Sensor (FREESTYLE LIBRE 2 SENSOR) MISC Inject 1 patch into the skin every 14 (fourteen) days.     furosemide (LASIX) 40 MG tablet TAKE 1 TABLET(40 MG) BY MOUTH TWICE DAILY 180 tablet 1   Insulin Glargine, 1 Unit Dial, (TOUJEO SOLOSTAR) 300 UNIT/ML SOPN Inject 50 Units into the skin at bedtime. (Patient taking differently: Inject 25 Units into the skin at bedtime.) 4.5 mL 2   isosorbide mononitrate (IMDUR) 120 MG 24 hr tablet Take 2 tablets (240 mg total) by mouth daily. (Patient taking differently: Take 120 mg by mouth in the morning.) 180 tablet 3    JARDIANCE 10 MG TABS tablet Take 10 mg by mouth in the morning and at bedtime.     Magnesium 250 MG TABS Take 250 mg by mouth in the morning and at bedtime.     metoprolol (TOPROL-XL) 200 MG 24 hr tablet Take 200 mg by mouth in the morning.     Potassium Chloride ER 20 MEQ TBCR TAKE 1 TABLET BY MOUTH TWICE DAILY (Patient taking differently: Take 20 mEq by mouth 2 (two) times daily.) 60 tablet 3   Rivaroxaban (XARELTO) 15 MG TABS tablet Take 1 tablet (15 mg total) by mouth at bedtime. Resume on 10/30/2020 1 tablet 0   rosuvastatin (CRESTOR) 20 MG tablet TAKE 1 TABLET BY MOUTH EVERY DAY (Patient taking differently: Take 20 mg by mouth at bedtime.) 30 tablet 3   tamsulosin (FLOMAX) 0.4 MG CAPS capsule Take 0.4 mg by mouth in the morning and at bedtime.  3   timolol (TIMOPTIC) 0.5 % ophthalmic solution Place 1 drop into both eyes in the morning.     No current facility-administered medications on file prior to visit.    Cardiovascular studies:  Coronary/bypass graft angiography 10/29/2020: LM: 100% occluded in mid vessel LAD: 100% occluded (at [facility]) LCx: 100% occluded (at [facility]) RCA: Known 100% occluded (not engaged today) LIMA-LAD: Patent. No stenosis. (Mid LAD stent is patent) SVG-OM2: Ostial 90% stenosis, followed by 100% occlusion SVG-Diag: 100% ostially occluded SVG-RCA: Known 100% occluded (not engaged today)   Entire  cardiac perfusion is through LIMA-LAD, providing minimal collaterals to distal RCA and distal OM branches   No target for revascularization. Continue medical management.    Echocardiogram 09/26/2020: Left ventricle cavity is normal in size. Moderate concentric hypertrophy of the left ventricle. Abnormal septal wall motion due to post-operative coronary artery bypass graft. Normal LV systolic function with visual EF 50-55%. Doppler evidence of grade II (pseudonormal) diastolic dysfunction, elevated LAP.  Left atrial cavity is mildly dilated. Trileaflet aortic valve with moderate  aortic valve leaflet calcification..   Mild (Grade I) aortic regurgitation. Vmax 1.6 m/sec, mean PG 6 mmHg, AVA 1.7 cm2 by continuity equation. Mild calcification of the mitral valve annulus. Moderate (Grade II), eccentric, posteriorly directed mitral regurgitation. Mild tricuspid regurgitation.  No evidence of pulmonary hypertension. No significant change compared to previous study on 03/14/2020.   MRA abdomen 09/14/2020: Abdominal MRA:  1. The suprarenal abdominal aorta is normal in size and morphology.  Infrarenal stent with artifact  precludes complete evaluation of most of the infrarenal abdominal aorta. Stent patency not assessed on this  exam.  2. 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 The SMA is patent without visible significant stenosis. The IMA is not well  visualized due to adjacent stent in the infrarenal aorta.    3.  There is mild stenosis (<30%) in the bilateral renal artery ostia.  4. Cholelithiasis.    EKG 06/26/2020: Atrial fibrillation 59 bpm  Inferolateral T wave inversion, consider ischemia  Carotid artery duplex  08/30/2018: Mild mixed plaque bilateral carotid arteries without significant stenosis. Antegrade right vertebral artery flow. Left vertebral artery waveform shows high resistance and may suggest distal stenosis.  Antegrade left vertebral artery flow. Overall no significant change from 11/10/2017.  Peripheral angiography and intervention 12/08/2017 & 12/15/2017: Abdominal aorta: Severe ectatic 80% stenosis distal aorta bifurcation-->Successful PTA Rt CIA and covered stent placement Infrarenal abdominal aorta (Viabahn 10.0 X 39 mm covered stent) Bilateral renal arteries: Normal Rt renal artery. Lt renal artery with moderate diffuse disease (limited visualization with CO2) Lt CIA 60% stenosis-->successful PTA with 0% residual stenosis Rt CIA severe 80% restenosis (prior  stent)-->successful PTA Mustang 6.0 mm balloon.  Mild restenosis Lt CIA  Lt EIA with distal 95% stenosis, successful drug coated PTA 6.0 X 120 mm In.Pact with 0% residual stenosis Lt common femoral artery 80% stenosis, successful drug coated PTA 6.0 X 120 mm In.Pact with 30% residual stenosis Bilateral SFA: Prox occluded with collaterals seen  Coronary/bnypass graft angiography 08/18/2017: LM: 100% LAD Prox 100%, mid 50%, D1 100% LCx: 100%, OM2 90% RCA: Prox 100%, distal 100%, PDA 100% LIMA-LAD: Patent SVG-D1 100% SVG-RCA: 100% SVG-OM2: 20%  LE arterial ultrasound 01/13/2018: Final Interpretation: Right: Significant plaque morphology and dampened waveforms in the right lower extremity suggest peripheral vascular disease with possible proximal obstruction. There is no obvious evidence of hemodynamically significant stenosis involving the right  lower extremity arteries.    Left: Significant plaque morphology and dampened waveforms in the left lower extremity suggest peripheral vascular disease with possible proximal obstruction. The superficial femoral artery is occluded in its mid to distal segments with reconstitution at  the popliteal artery.  Lower extremity venous duplex 06/02/2018: No DVT seen  Carotid artery duplex  08/30/2018 Mild mixed plaque bilateral carotid arteries without significant stenosis. Antegrade right vertebral artery flow. Left vertebral artery waveform shows high resistance and may suggest distal stenosis.  Antegrade left vertebral artery flow.  Overall no significant change from 11/10/2017.  Recent labs: 10/29/2020: Glucose 105, BUN/Cr 32/1.6. Na/K 142/4.0.   09/14/2020: Glucose 200, BUN/Cr 36/1.8. EGFR 38. Na/K 137/3.9. Albumin 3.4. Rest of the CMP normal Pro BNP 2858 (<850) H/H 12.8/40. MCV 93. Platelets 145 Chol 161, TG 284, HDL 30, LDL 74  06/26/2020: Glucose 205, BUN/Cr 28/1/86. EGFR 36. Na/K 134/4.6. Rest of the CMP normal H/H 14/41. MCV 91.  Platelets 174  Results for ROSHARD, REZABEK (MRN [id number]) as of 07/26/2020 12:54  Ref. Range 06/26/2020 15:28  Troponin I (High Sensitivity) Latest Ref Range: <18 ng/L 14    01/18/2020: Glucose 203, BUN/Cr 22/1.64. EGFR 45. Na/K 136/4.6. Rest of the CMP normal H/H 12.5/37. MCV 93. Platelets 255 HbA1C 10.5% Chol 162, TG 128, HDL 37, LDL 98  04/25/2019: H/H 12.5/37. MCV 93. Platelets 255  01/28/2019: Glucose 272, BUN/Cr 23/1.42. EGFR 53. Na/K 137/4.7. Rest of the CMP normal H/H 12.5/36.7. MCV 93.4. Platelets 108     Review of Systems  Cardiovascular:  Positive for chest pain and dyspnea on exertion (Improved). Negative for claudication, leg swelling, palpitations and syncope.  Hematologic/Lymphatic: Does not bruise/bleed easily.  Musculoskeletal:  Positive for joint pain.  All other systems reviewed and are negative.   Vitals:   02/22/21 1320  BP: 127/69  Pulse: 62  Resp: 16  Temp: 98 F (36.7 C)  SpO2: 98%      Objective:    Physical Exam Vitals and nursing note reviewed.  Constitutional:      General: He is not in acute distress.    Appearance: He is well-developed.  Neck:     Vascular: JVD present.  Cardiovascular:     Rate and Rhythm: Normal rate. Rhythm irregularly irregular.     Pulses:          Femoral pulses are 1+ on the right side and 1+ on the left side.      Dorsalis pedis pulses are 0 on the right side and 0 on the left side.       Posterior tibial pulses are 0 on the right side and 0 on the left side.     Heart sounds: Murmur heard.  Harsh midsystolic murmur is present with a grade of 3/6 at the upper right sternal border radiating to the neck.  Pulmonary:     Effort: Pulmonary effort is normal.     Breath sounds: Normal breath sounds. No wheezing or rales.  Musculoskeletal:     Right lower leg: Edema (2+) present.     Left lower leg: Edema (2+) present.  Neurological:     Mental Status: He is alert and oriented to person, place, and time.            Assessment & Recommendations:   80 year old [place] American male with hypertension, hyperlipidemia, hyperlipidemia, type 2 DM, coronary artery disease s/p CABG 1992, PCI is in 2000, 2001, moderate MR, PAD s/p prior iliac stents, persistent Afib on Xarelto, OSA on CPAP, CKD 3, GERD, h/o MGUS followed by Dr. 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   CAD with stable angina: Severe native vessel and bypass graft disease with LIMA-LAD being the only functioning graft/vessel. I again reviewed the angiogram results in detail with the patient and his wife. Given the chronicity of grafts occlusion, as well as severe native vessel disease with proximal occlusions of LM and RCA (which date back several years), I truly do not think he  has any targets for percutaneous or surgical revascularization. LIMA-LAD is patent. Mid LAD stent after LIMA-LAD touchdown is patent. Knowing this information from cath in 10/2020, I doubt repeat cath will help much to change the management.   He is currently on metoprolol succinate 200 mg and Imdur 120 mg daily. Amlodipine 2.5 mg was recommended by Dr. Loyal Buba at [facility]. We can certainly try this, but I am not optimistic it will have significant improvement in his angina symptoms. In the past, I have offered to start Ranexa, but patient and wife have been understandably wary given his renal function.  I will forward my notes to Dr. Sharlet Salina. In addition, I will also forward CD of his cath films to Dr. Sharlet Salina and see if EECP will be an option.  Acute on chronic HFpEF: Clinically, he is in volume overload today with leg edema and JVP. I have increased his lasix from 40 mg bid to 80 mg in am and 40 mg in pm. Will repeat BMP and check BNP in a few weeks. I will see him back in 4 weeks with same day echocardiogram to evaluate LV/RV function-thus far preserved, and mild AS, mod  MR.  Hypertension: Well-controlled.  Permanent Afib:  [id number] score 4, annual stroke risk 5%. Controlled ventricular rate. Continue anticoagulation with Xarelto 15 mg daily.   F/u in 4 weeks  [place], MD [facility] Pager: [phone] Office: [phone] If no answer Cell [phone]

## 2021-02-26 NOTE — Telephone Encounter (Signed)
From patient.

## 2021-02-26 NOTE — Telephone Encounter (Signed)
From pt's wife

## 2021-03-05 NOTE — Telephone Encounter (Signed)
From pt's wife

## 2021-03-19 ENCOUNTER — Telehealth: Payer: Self-pay | Admitting: Cardiology

## 2021-03-19 NOTE — Telephone Encounter (Signed)
Please put on my lunch hour tomorrow.  I called Mrs. Parkison a few days back but had to leave a voicemail. I will speak with Mrs. Beauregard and daughter during tomorrow's appt.  Thanks MJP

## 2021-03-19 NOTE — Telephone Encounter (Signed)
Pt's daughter calling in regards to patient, says evenings have been getting difficult as patient feeling very fatigued. No pain, but wakes up extremely fatigued. Pt's daughter would like to discuss to see if pt should be seen sooner than current appts (9/27 echo & f/u). Phone # for daughter Doristine Locks is [phone].

## 2021-03-20 ENCOUNTER — Encounter: Payer: Self-pay | Admitting: Cardiology

## 2021-03-20 ENCOUNTER — Other Ambulatory Visit: Payer: Self-pay

## 2021-03-20 ENCOUNTER — Ambulatory Visit: Payer: Medicare Other | Admitting: Cardiology

## 2021-03-20 VITALS — BP 140/66 | HR 65 | Temp 98.2°F | Ht 66.0 in | Wt 183.0 lb

## 2021-03-20 DIAGNOSIS — I350 Nonrheumatic aortic (valve) stenosis: Secondary | ICD-10-CM

## 2021-03-20 DIAGNOSIS — I4819 Other persistent atrial fibrillation: Secondary | ICD-10-CM

## 2021-03-20 DIAGNOSIS — I25708 Atherosclerosis of coronary artery bypass graft(s), unspecified, with other forms of angina pectoris: Secondary | ICD-10-CM

## 2021-03-20 MED ORDER — RANOLAZINE ER 500 MG PO TB12: 500.0000 mg | ORAL_TABLET | Freq: Two times a day (BID) | ORAL | 3 refills | Status: DC

## 2021-03-20 NOTE — Progress Notes (Signed)
Subjective:   Dustin [name], male    DOB: 08-14-1940, 80 y.o.   MRN: [id number]   Chief complaint:  Aortic stenosis   HPI  80 year-old [place] American male with hypertension, hyperlipidemia, hyperlipidemia, type 2 DM, coronary artery disease s/p CABG 1992, PCI is in 2000, 2001, moderate MR, PAD s/p prior iliac stents, persistent Afib on Xarelto, OSA on CPAP, CKD 3, GERD, h/o MGUS followed by Dr. 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   Patient underwent coronary bypass graft angiography in 10/2020 that showed LIMA-LAD is the only functioning graft/vessel.  Patient continues to have episodes of pain between his shoulder blades with activity, that improved with rest.  In addition, he has also had worsening exertional dyspnea symptoms.  Patient saw cardiologist Dr. Sharlet Salina at [facility] for consideration for EECP. His Afib and PAD are limiting factors for EECP. In addition, Dr. Sharlet Salina also wondered if the LAD stent is open, which it is.   Patient is here today for follow-up with his wife.  Wife reports that she has seen gradual worsening of his physical capacity.  He gets out of breath with minimal activity such as walking from kitchen to bathroom.  He drinks several diet drinks throughout the day.  She is concerned about overall fluid intake.  Also has questions if patient be candidate for laser angioplasty.   Current Outpatient Medications on File Prior to Visit  Medication Sig Dispense Refill  . acetaminophen (TYLENOL) 650 MG CR tablet Take 650 mg by mouth every 8 (eight) hours as needed (for mild pain).    . Cholecalciferol (VITAMIN D3) 125 MCG (5000 UT) TABS Take 10,000 Units by mouth daily in the afternoon.    . Continuous Blood Gluc Sensor (FREESTYLE LIBRE 2 SENSOR) MISC Inject 1 patch into the skin every 14 (fourteen) days.    . furosemide (LASIX) 40 MG tablet Take 1 tablet (40 mg total) by mouth as directed.  2 pills in am, 1 pill in pm 180 tablet 1  . Insulin Glargine, 1 Unit Dial, (TOUJEO SOLOSTAR) 300 UNIT/ML SOPN Inject 50 Units into the skin at bedtime. (Patient taking differently: Inject 16 Units into the skin at bedtime.) 4.5 mL 2  . insulin lispro (HUMALOG) 100 UNIT/ML KwikPen 6 units +correctional    . isosorbide mononitrate (IMDUR) 120 MG 24 hr tablet Take 2 tablets (240 mg total) by mouth daily. (Patient taking differently: Take 120 mg by mouth in the morning.) 180 tablet 3  . Magnesium 250 MG TABS Take 250 mg by mouth in the morning and at bedtime.    . metoprolol (TOPROL-XL) 200 MG 24 hr tablet Take 200 mg by mouth in the morning.    . Potassium Chloride ER 20 MEQ TBCR TAKE 1 TABLET BY MOUTH TWICE DAILY (Patient taking differently: Take 20 mEq by mouth 2 (two) times daily.) 60 tablet 3  . Rivaroxaban (XARELTO) 15 MG TABS tablet Take 1 tablet (15 mg total) by mouth at bedtime. Resume on 10/30/2020 1 tablet 0  . rosuvastatin (CRESTOR) 20 MG tablet TAKE 1 TABLET BY MOUTH EVERY DAY (Patient taking differently: Take 20 mg by mouth at bedtime.) 30 tablet 3  . tamsulosin (FLOMAX) 0.4 MG CAPS capsule Take 0.4 mg by mouth in the morning and at bedtime.  3  . timolol (TIMOPTIC) 0.5 % ophthalmic solution Place 1 drop into both eyes in the morning.    Marland Kitchen amLODipine (NORVASC) 2.5  MG tablet Take 2.5 mg by mouth daily.     No current facility-administered medications on file prior to visit.    Cardiovascular studies:  Coronary/bypass graft angiography 10/29/2020: LM: 100% occluded in mid vessel LAD: 100% occluded (at [facility]) LCx: 100% occluded (at [facility]) RCA: Known 100% occluded (not engaged today) LIMA-LAD: Patent. No stenosis. (Mid LAD stent is patent) SVG-OM2: Ostial 90% stenosis, followed by 100% occlusion SVG-Diag: 100% ostially occluded SVG-RCA: Known 100% occluded (not engaged today)   Entire cardiac perfusion is through LIMA-LAD, providing minimal collaterals to distal RCA and distal OM branches    No target for revascularization. Continue medical management.    Echocardiogram 09/26/2020: Left ventricle cavity is normal in size. Moderate concentric hypertrophy of the left ventricle. Abnormal septal wall motion due to post-operative coronary artery bypass graft. Normal LV systolic function with visual EF 50-55%. Doppler evidence of grade II (pseudonormal) diastolic dysfunction, elevated LAP.  Left atrial cavity is mildly dilated. Trileaflet aortic valve with moderate aortic valve leaflet calcification..   Mild (Grade I) aortic regurgitation. Vmax 1.6 m/sec, mean PG 6 mmHg, AVA 1.7 cm2 by continuity equation. Mild calcification of the mitral valve annulus. Moderate (Grade II), eccentric, posteriorly directed mitral regurgitation. Mild tricuspid regurgitation.  No evidence of pulmonary hypertension. No significant change compared to previous study on 03/14/2020.   MRA abdomen 09/14/2020: Abdominal MRA:  1. The suprarenal abdominal aorta is normal in size and morphology.  Infrarenal stent with artifact  precludes complete evaluation of most of the infrarenal abdominal aorta. Stent patency not assessed on this  exam.  2. 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 The SMA is patent without visible significant stenosis. The IMA is not well  visualized due to adjacent stent in the infrarenal aorta.    3.  There is mild stenosis (<30%) in the bilateral renal artery ostia.  4. Cholelithiasis.    EKG 06/26/2020: Atrial fibrillation 59 bpm  Inferolateral T wave inversion, consider ischemia  Carotid artery duplex  08/30/2018: Mild mixed plaque bilateral carotid arteries without significant stenosis. Antegrade right vertebral artery flow. Left vertebral artery waveform shows high resistance and may suggest distal stenosis.  Antegrade left vertebral artery flow. Overall no significant change from  11/10/2017.  Peripheral angiography and intervention 12/08/2017 & 12/15/2017: Abdominal aorta: Severe ectatic 80% stenosis distal aorta bifurcation-->Successful PTA Rt CIA and covered stent placement Infrarenal abdominal aorta (Viabahn 10.0 X 39 mm covered stent) Bilateral renal arteries: Normal Rt renal artery. Lt renal artery with moderate diffuse disease (limited visualization with CO2) Lt CIA 60% stenosis-->successful PTA with 0% residual stenosis Rt CIA severe 80% restenosis (prior stent)-->successful PTA Mustang 6.0 mm balloon.  Mild restenosis Lt CIA  Lt EIA with distal 95% stenosis, successful drug coated PTA 6.0 X 120 mm In.Pact with 0% residual stenosis Lt common femoral artery 80% stenosis, successful drug coated PTA 6.0 X 120 mm In.Pact with 30% residual stenosis Bilateral SFA: Prox occluded with collaterals seen  Coronary/bnypass graft angiography 08/18/2017: LM: 100% LAD Prox 100%, mid 50%, D1 100% LCx: 100%, OM2 90% RCA: Prox 100%, distal 100%, PDA 100% LIMA-LAD: Patent SVG-D1 100% SVG-RCA: 100% SVG-OM2: 20%  LE arterial ultrasound 01/13/2018: Final Interpretation: Right: Significant plaque morphology and dampened waveforms in the right lower extremity suggest peripheral vascular disease with possible proximal obstruction. There is no obvious evidence of hemodynamically significant stenosis involving the right  lower extremity arteries.    Left:  Significant plaque morphology and dampened waveforms in the left lower extremity suggest peripheral vascular disease with possible proximal obstruction. The superficial femoral artery is occluded in its mid to distal segments with reconstitution at  the popliteal artery.  Lower extremity venous duplex 06/02/2018: No DVT seen  Carotid artery duplex  08/30/2018 Mild mixed plaque bilateral carotid arteries without significant stenosis. Antegrade right vertebral artery flow. Left vertebral artery waveform shows high resistance  and may suggest distal stenosis.  Antegrade left vertebral artery flow. Overall no significant change from 11/10/2017.  Recent labs: 10/29/2020: Glucose 105, BUN/Cr 32/1.6. Na/K 142/4.0.   09/14/2020: Glucose 200, BUN/Cr 36/1.8. EGFR 38. Na/K 137/3.9. Albumin 3.4. Rest of the CMP normal Pro BNP 2858 (<850) H/H 12.8/40. MCV 93. Platelets 145 Chol 161, TG 284, HDL 30, LDL 74  06/26/2020: Glucose 205, BUN/Cr 28/1/86. EGFR 36. Na/K 134/4.6. Rest of the CMP normal H/H 14/41. MCV 91. Platelets 174  Results for ANA, LIAW (MRN [id number]) as of 07/26/2020 12:54  Ref. Range 06/26/2020 15:28  Troponin I (High Sensitivity) Latest Ref Range: <18 ng/L 14    01/18/2020: Glucose 203, BUN/Cr 22/1.64. EGFR 45. Na/K 136/4.6. Rest of the CMP normal H/H 12.5/37. MCV 93. Platelets 255 HbA1C 10.5% Chol 162, TG 128, HDL 37, LDL 98  04/25/2019: H/H 12.5/37. MCV 93. Platelets 255  01/28/2019: Glucose 272, BUN/Cr 23/1.42. EGFR 53. Na/K 137/4.7. Rest of the CMP normal H/H 12.5/36.7. MCV 93.4. Platelets 108     Review of Systems  Cardiovascular:  Positive for chest pain and dyspnea on exertion (Improved). Negative for claudication, leg swelling, palpitations and syncope.  Hematologic/Lymphatic: Does not bruise/bleed easily.  Musculoskeletal:  Positive for joint pain.  All other systems reviewed and are negative.   Vitals:   03/20/21 1204 03/20/21 1209  BP: (!) 146/74 140/66  Pulse: 62 65  Temp: 98.2 F (36.8 C)   SpO2: 98% 97%      Objective:    Physical Exam Vitals and nursing note reviewed.  Constitutional:      General: He is not in acute distress.    Appearance: He is well-developed.  Neck:     Vascular: JVD present.  Cardiovascular:     Rate and Rhythm: Normal rate. Rhythm irregularly irregular.     Pulses:          Femoral pulses are 1+ on the right side and 1+ on the left side.      Dorsalis pedis pulses are 0 on the right side and 0 on the left side.       Posterior  tibial pulses are 0 on the right side and 0 on the left side.     Heart sounds: Murmur heard.  Harsh midsystolic murmur is present with a grade of 3/6 at the upper right sternal border radiating to the neck.  Pulmonary:     Effort: Pulmonary effort is normal.     Breath sounds: Normal breath sounds. No wheezing or rales.  Musculoskeletal:     Right lower leg: Edema (1+) present.     Left lower leg: Edema (1+) present.  Neurological:     Mental Status: He is alert and oriented to person, place, and time.           Assessment & Recommendations:   80 year old [place] male with hypertension, hyperlipidemia, hyperlipidemia, type 2 DM, coronary artery disease s/p CABG 1992, PCI is in 2000, 2001, moderate MR, PAD s/p prior iliac stents, persistent Afib on Xarelto, OSA on CPAP,  CKD 3, GERD, h/o MGUS followed by Dr. 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   CAD with stable angina: Severe native vessel and bypass graft disease with LIMA-LAD being the only functioning graft/vessel. I again reviewed the angiogram results in detail with the patient and his wife. Given the chronicity of grafts occlusion, as well as severe native vessel disease with proximal occlusions of LM and RCA (which date back several years), I truly do not think he has any targets for percutaneous or surgical revascularization. LIMA-LAD is patent. Mid LAD stent after LIMA-LAD touchdown is patent. Knowing this information from cath in 10/2020, I doubt repeat cath will help much to change the management.   He is currently on metoprolol succinate 200 mg and Imdur 120 mg daily.  I explained to the patient and wife that he is not a candidate for any form of revascularization, including laser angioplasty.  His native arteries and bypass grafts have been long occluded.  Any attempts to revascularization are unlikely to be successful.  If he is not a  candidate for EECP, we could certainly try Ranexa.  I explained to them that we should not expect drastic improvement in his symptoms, but can hope for some improvement in his angina and dyspnea symptoms.  Start Ranexa 500 mg twice daily.  Will check labs in 1 week.  Mild AS: Stable Echocardiogram in 08/2021.  Hypertension: Well-controlled.  Permanent Afib:  [id number] score 4, annual stroke risk 5%. Controlled ventricular rate. Continue anticoagulation with Xarelto 15 mg daily.   Virtual visit f/y in 4 weeks  Milley Vining Esther Hardy, MD [facility] Pager: [phone] Office: [phone] If no answer Cell [phone]

## 2021-03-26 ENCOUNTER — Other Ambulatory Visit: Payer: Medicare Other

## 2021-03-26 ENCOUNTER — Ambulatory Visit: Payer: Medicare Other | Admitting: Cardiology

## 2021-03-27 LAB — BASIC METABOLIC PANEL
BUN/Creatinine Ratio: 17 (ref 10–24)
BUN: 34 mg/dL — ABNORMAL HIGH (ref 8–27)
CO2: 23 mmol/L (ref 20–29)
Calcium: 9.5 mg/dL (ref 8.6–10.2)
Chloride: 99 mmol/L (ref 96–106)
Creatinine, Ser: 1.98 mg/dL — ABNORMAL HIGH (ref 0.76–1.27)
Glucose: 237 mg/dL — ABNORMAL HIGH (ref 70–99)
Potassium: 4.5 mmol/L (ref 3.5–5.2)
Sodium: 140 mmol/L (ref 134–144)
eGFR: 33 mL/min/{1.73_m2} — ABNORMAL LOW (ref >59)

## 2021-03-27 LAB — BRAIN NATRIURETIC PEPTIDE: BNP: 263.2 pg/mL — ABNORMAL HIGH (ref 0.0–100.0)

## 2021-04-24 ENCOUNTER — Telehealth: Payer: Medicare Other | Admitting: Cardiology

## 2021-05-06 ENCOUNTER — Encounter: Payer: Self-pay | Admitting: Cardiology

## 2021-05-06 ENCOUNTER — Other Ambulatory Visit: Payer: Self-pay

## 2021-05-06 ENCOUNTER — Ambulatory Visit: Payer: Medicare Other | Admitting: Cardiology

## 2021-05-06 VITALS — BP 120/66 | HR 50 | Ht 66.0 in | Wt 183.0 lb

## 2021-05-06 DIAGNOSIS — I5032 Chronic diastolic (congestive) heart failure: Secondary | ICD-10-CM

## 2021-05-06 DIAGNOSIS — I25708 Atherosclerosis of coronary artery bypass graft(s), unspecified, with other forms of angina pectoris: Secondary | ICD-10-CM

## 2021-05-06 DIAGNOSIS — I350 Nonrheumatic aortic (valve) stenosis: Secondary | ICD-10-CM

## 2021-05-06 DIAGNOSIS — I340 Nonrheumatic mitral (valve) insufficiency: Secondary | ICD-10-CM

## 2021-05-06 MED ORDER — FUROSEMIDE 40 MG PO TABS: 40.0000 mg | ORAL_TABLET | ORAL | 1 refills | Status: DC

## 2021-05-06 MED ORDER — RANOLAZINE ER 500 MG PO TB12: 500.0000 mg | ORAL_TABLET | Freq: Two times a day (BID) | ORAL | 3 refills | Status: DC

## 2021-05-06 NOTE — Progress Notes (Signed)
Subjective:   Dustin [name], male    DOB: [birth year], 80 y.o.   MRN: [id number]  I connected with the patient on 05/06/2021 by a telephone call and verified that I am speaking with the correct person using two identifiers.     I offered the patient a video enabled application for a virtual visit. Unfortunately, this could not be accomplished due to technical difficulties/lack of video enabled phone/computer. I discussed the limitations of evaluation and management by telemedicine and the availability of in person appointments. The patient expressed understanding and agreed to proceed.   This visit type was conducted due to national recommendations for restrictions regarding the COVID-19 Pandemic (e.g. social distancing).  This format is felt to be most appropriate for this patient at this time.  All issues noted in this document were discussed and addressed.  No physical exam was performed (except for noted visual exam findings with Tele health visits).  The patient has consented to conduct a Tele health visit and understands insurance will be billed.   Chief complaint:  Aortic stenosis   HPI  80 year-old [place] American male with hypertension, hyperlipidemia, hyperlipidemia, type 2 DM, coronary artery disease s/p CABG 1992, PCI is in 2000, 2001, moderate MR, PAD s/p prior iliac stents, persistent Afib on Xarelto, OSA on CPAP, CKD 3, GERD, h/o MGUS followed by Dr. 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   Patient underwent coronary bypass graft angiography in 10/2020 that showed LIMA-LAD is the only functioning graft/vessel.  Patient continues to have episodes of pain between his shoulder blades with activity, that improved with rest.  In addition, he has also had worsening exertional dyspnea symptoms.  Patient saw cardiologist Dr. Sharlet Salina at [facility] for consideration for EECP. His Afib and PAD are limiting factors for  EECP. In addition, Dr. Sharlet Salina also wondered if the LAD stent is open, which it is.   Noting that he is likely not a good candidate for EECP, I started him on Ranexa 500 mg bid at last bisit. I also increased his lasix from 40 mg bid to 80 mg in am and 40 mg in pm. Since then, his angina and dyspnea symptoms have improved. Reviewed recent lab results with the patient and his wife, details below.   Current Outpatient Medications on File Prior to Visit  Medication Sig Dispense Refill   acetaminophen (TYLENOL) 650 MG CR tablet Take 650 mg by mouth every 8 (eight) hours as needed (for mild pain).     amLODipine (NORVASC) 2.5 MG tablet Take 2.5 mg by mouth daily.     Cholecalciferol (VITAMIN D3) 125 MCG (5000 UT) TABS Take 10,000 Units by mouth daily in the afternoon.     Continuous Blood Gluc Sensor (FREESTYLE LIBRE 2 SENSOR) MISC Inject 1 patch into the skin every 14 (fourteen) days.     furosemide (LASIX) 40 MG tablet Take 1 tablet (40 mg total) by mouth as directed. 2 pills in am, 1 pill in pm 180 tablet 1   Insulin Glargine, 1 Unit Dial, (TOUJEO SOLOSTAR) 300 UNIT/ML SOPN Inject 50 Units into the skin at bedtime. (Patient taking differently: Inject 16 Units into the skin at bedtime.) 4.5 mL 2   insulin lispro (HUMALOG) 100 UNIT/ML KwikPen 6 units +correctional     isosorbide mononitrate (IMDUR) 120 MG 24 hr tablet Take 2 tablets (240 mg total) by mouth daily. (Patient taking differently: Take 120 mg by mouth  in the morning.) 180 tablet 3   Magnesium 250 MG TABS Take 250 mg by mouth in the morning and at bedtime.     metoprolol (TOPROL-XL) 200 MG 24 hr tablet Take 200 mg by mouth in the morning.     Potassium Chloride ER 20 MEQ TBCR TAKE 1 TABLET BY MOUTH TWICE DAILY (Patient taking differently: Take 20 mEq by mouth 2 (two) times daily.) 60 tablet 3   ranolazine (RANEXA) 500 MG 12 hr tablet Take 1 tablet (500 mg total) by mouth 2 (two) times daily. 60 tablet 3   Rivaroxaban (XARELTO) 15 MG TABS  tablet Take 1 tablet (15 mg total) by mouth at bedtime. Resume on 10/30/2020 1 tablet 0   rosuvastatin (CRESTOR) 20 MG tablet TAKE 1 TABLET BY MOUTH EVERY DAY (Patient taking differently: Take 20 mg by mouth at bedtime.) 30 tablet 3   tamsulosin (FLOMAX) 0.4 MG CAPS capsule Take 0.4 mg by mouth in the morning and at bedtime.  3   timolol (TIMOPTIC) 0.5 % ophthalmic solution Place 1 drop into both eyes in the morning.     No current facility-administered medications on file prior to visit.    Cardiovascular studies:  Coronary/bypass graft angiography 10/29/2020: LM: 100% occluded in mid vessel LAD: 100% occluded (at [facility]) LCx: 100% occluded (at [facility]) RCA: Known 100% occluded (not engaged today) LIMA-LAD: Patent. No stenosis. (Mid LAD stent is patent) SVG-OM2: Ostial 90% stenosis, followed by 100% occlusion SVG-Diag: 100% ostially occluded SVG-RCA: Known 100% occluded (not engaged today)   Entire cardiac perfusion is through LIMA-LAD, providing minimal collaterals to distal RCA and distal OM branches   No target for revascularization. Continue medical management.    Echocardiogram 09/26/2020: Left ventricle cavity is normal in size. Moderate concentric hypertrophy of the left ventricle. Abnormal septal wall motion due to post-operative coronary artery bypass graft. Normal LV systolic function with visual EF 50-55%. Doppler evidence of grade II (pseudonormal) diastolic dysfunction, elevated LAP.  Left atrial cavity is mildly dilated. Trileaflet aortic valve with moderate aortic valve leaflet calcification..   Mild (Grade I) aortic regurgitation. Vmax 1.6 m/sec, mean PG 6 mmHg, AVA 1.7 cm2 by continuity equation. Mild calcification of the mitral valve annulus. Moderate (Grade II), eccentric, posteriorly directed mitral regurgitation. Mild tricuspid regurgitation.  No evidence of pulmonary hypertension. No significant change compared to previous study on 03/14/2020.   MRA abdomen  09/14/2020: Abdominal MRA:  1. The suprarenal abdominal aorta is normal in size and morphology.  Infrarenal stent with artifact  precludes complete evaluation of most of the infrarenal abdominal aorta. Stent patency not assessed on this  exam.  2. 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 The SMA is patent without visible significant stenosis. The IMA is not well  visualized due to adjacent stent in the infrarenal aorta.    3.  There is mild stenosis (<30%) in the bilateral renal artery ostia.  4. Cholelithiasis.    EKG 06/26/2020: Atrial fibrillation 59 bpm  Inferolateral T wave inversion, consider ischemia  Carotid artery duplex  08/30/2018: Mild mixed plaque bilateral carotid arteries without significant stenosis. Antegrade right vertebral artery flow. Left vertebral artery waveform shows high resistance and may suggest distal stenosis.  Antegrade left vertebral artery flow. Overall no significant change from 11/10/2017.  Peripheral angiography and intervention 12/08/2017 & 12/15/2017: Abdominal aorta: Severe ectatic 80% stenosis distal aorta bifurcation-->Successful PTA Rt CIA and covered stent placement Infrarenal  abdominal aorta (Viabahn 10.0 X 39 mm covered stent) Bilateral renal arteries: Normal Rt renal artery. Lt renal artery with moderate diffuse disease (limited visualization with CO2) Lt CIA 60% stenosis-->successful PTA with 0% residual stenosis Rt CIA severe 80% restenosis (prior stent)-->successful PTA Mustang 6.0 mm balloon.  Mild restenosis Lt CIA  Lt EIA with distal 95% stenosis, successful drug coated PTA 6.0 X 120 mm In.Pact with 0% residual stenosis Lt common femoral artery 80% stenosis, successful drug coated PTA 6.0 X 120 mm In.Pact with 30% residual stenosis Bilateral SFA: Prox occluded with collaterals seen  Coronary/bnypass graft angiography 08/18/2017: LM: 100% LAD Prox 100%,  mid 50%, D1 100% LCx: 100%, OM2 90% RCA: Prox 100%, distal 100%, PDA 100% LIMA-LAD: Patent SVG-D1 100% SVG-RCA: 100% SVG-OM2: 20%  LE arterial ultrasound 01/13/2018: Final Interpretation: Right: Significant plaque morphology and dampened waveforms in the right lower extremity suggest peripheral vascular disease with possible proximal obstruction. There is no obvious evidence of hemodynamically significant stenosis involving the right  lower extremity arteries.    Left: Significant plaque morphology and dampened waveforms in the left lower extremity suggest peripheral vascular disease with possible proximal obstruction. The superficial femoral artery is occluded in its mid to distal segments with reconstitution at  the popliteal artery.  Lower extremity venous duplex 06/02/2018: No DVT seen  Carotid artery duplex  08/30/2018 Mild mixed plaque bilateral carotid arteries without significant stenosis. Antegrade right vertebral artery flow. Left vertebral artery waveform shows high resistance and may suggest distal stenosis.  Antegrade left vertebral artery flow. Overall no significant change from 11/10/2017.  Recent labs: 03/26/2021: Glucose 237, BUN/Cr 34/1.98. EGFR 33. Na/K 140/4.5.   10/29/2020: Glucose 105, BUN/Cr 32/1.6. Na/K 142/4.0.   09/14/2020: Glucose 200, BUN/Cr 36/1.8. EGFR 38. Na/K 137/3.9. Albumin 3.4. Rest of the CMP normal Pro BNP 2858 (<850) H/H 12.8/40. MCV 93. Platelets 145 Chol 161, TG 284, HDL 30, LDL 74  06/26/2020: Glucose 205, BUN/Cr 28/1/86. EGFR 36. Na/K 134/4.6. Rest of the CMP normal H/H 14/41. MCV 91. Platelets 174  Results for Dustin, [name] (MRN [id number]) as of 07/26/2020 12:54  Ref. Range 06/26/2020 15:28  Troponin I (High Sensitivity) Latest Ref Range: <18 ng/L 14    01/18/2020: Glucose 203, BUN/Cr 22/1.64. EGFR 45. Na/K 136/4.6. Rest of the CMP normal H/H 12.5/37. MCV 93. Platelets 255 HbA1C 10.5% Chol 162, TG 128, HDL 37, LDL  98  04/25/2019: H/H 12.5/37. MCV 93. Platelets 255  01/28/2019: Glucose 272, BUN/Cr 23/1.42. EGFR 53. Na/K 137/4.7. Rest of the CMP normal H/H 12.5/36.7. MCV 93.4. Platelets 108     Review of Systems  Cardiovascular:  Positive for chest pain (Improved) and dyspnea on exertion (Improved). Negative for claudication, leg swelling, palpitations and syncope.  Hematologic/Lymphatic: Does not bruise/bleed easily.  Musculoskeletal:  Positive for joint pain.  All other systems reviewed and are negative.   Vitals:   05/06/21 1135  BP: 120/66  Pulse: (!) 50      Objective:    Physical exam not performed, as this is a telephone visit         Assessment & Recommendations:   80 year old African American male with hypertension, hyperlipidemia, hyperlipidemia, type 2 DM, coronary artery disease s/p CABG 1992, PCI is in 2000, 2001, moderate MR, PAD s/p prior iliac stents, persistent Afib on Xarelto, OSA on CPAP, CKD 3, GERD, h/o MGUS followed by Dr. 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   CAD with stable  angina: Severe native vessel and bypass graft disease with LIMA-LAD being the only functioning graft/vessel. I again reviewed the angiogram results in detail with the patient and his wife. Given the chronicity of grafts occlusion, as well as severe native vessel disease with proximal occlusions of LM and RCA (which date back several years), I truly do not think he has any targets for percutaneous or surgical revascularization. He is also deemed to be not a good candidate for EECP.  Continue metoprolol succinate 200 mg, Imdur 120 mg daily, tolerating Ranexa 500 mg bid.  Mild AS, mod MR: Stable Echocardiogram in 08/2021.  Hypertension: Well-controlled.  Permanent Afib:  [id number] score 4, annual stroke risk 5%. Controlled ventricular rate. Continue anticoagulation with Xarelto 15 mg daily.   Echo and same  day f/u in 3 months  Adriauna Campton Esther Hardy, MD [facility] [place] Pager: [phone] Office: [phone] If no answer Cell [phone]

## 2021-06-12 ENCOUNTER — Other Ambulatory Visit: Payer: Self-pay

## 2021-06-12 DIAGNOSIS — D472 Monoclonal gammopathy: Secondary | ICD-10-CM

## 2021-06-13 ENCOUNTER — Inpatient Hospital Stay: Payer: Medicare Other | Attending: Hematology

## 2021-06-13 ENCOUNTER — Inpatient Hospital Stay: Payer: Medicare Other | Admitting: Hematology

## 2021-06-13 DIAGNOSIS — D472 Monoclonal gammopathy: Secondary | ICD-10-CM | POA: Insufficient documentation

## 2021-06-14 ENCOUNTER — Other Ambulatory Visit: Payer: Self-pay

## 2021-06-14 ENCOUNTER — Telehealth: Payer: Self-pay | Admitting: Hematology

## 2021-06-14 NOTE — Progress Notes (Signed)
Sent high priority scheduling note on 06/14/2021 to have pt's appts rescheduled from 06/13/2021 to the week of 06/17/2021.

## 2021-06-14 NOTE — Telephone Encounter (Signed)
Scheduled per sch msg. Called and spoke with patient. Confirmed appt  

## 2021-06-19 ENCOUNTER — Other Ambulatory Visit: Payer: Self-pay

## 2021-06-19 ENCOUNTER — Inpatient Hospital Stay (HOSPITAL_BASED_OUTPATIENT_CLINIC_OR_DEPARTMENT_OTHER): Payer: Medicare Other | Admitting: Hematology

## 2021-06-19 ENCOUNTER — Encounter: Payer: Self-pay | Admitting: Hematology

## 2021-06-19 ENCOUNTER — Inpatient Hospital Stay: Payer: Medicare Other

## 2021-06-19 VITALS — BP 124/63 | HR 60 | Resp 17 | Wt 182.1 lb

## 2021-06-19 DIAGNOSIS — D472 Monoclonal gammopathy: Secondary | ICD-10-CM

## 2021-06-19 LAB — CBC WITH DIFFERENTIAL (CANCER CENTER ONLY)
Abs Immature Granulocytes: 0.04 10*3/uL (ref 0.00–0.07)
Basophils Absolute: 0 10*3/uL (ref 0.0–0.1)
Basophils Relative: 1 %
Eosinophils Absolute: 0.1 10*3/uL (ref 0.0–0.5)
Eosinophils Relative: 1 %
HCT: 37.8 % — ABNORMAL LOW (ref 39.0–52.0)
Hemoglobin: 12.5 g/dL — ABNORMAL LOW (ref 13.0–17.0)
Immature Granulocytes: 1 %
Lymphocytes Relative: 26 %
Lymphs Abs: 1.9 10*3/uL (ref 0.7–4.0)
MCH: 30.6 pg (ref 26.0–34.0)
MCHC: 33.1 g/dL (ref 30.0–36.0)
MCV: 92.4 fL (ref 80.0–100.0)
Monocytes Absolute: 0.7 10*3/uL (ref 0.1–1.0)
Monocytes Relative: 10 %
Neutro Abs: 4.5 10*3/uL (ref 1.7–7.7)
Neutrophils Relative %: 61 %
Platelet Count: 167 10*3/uL (ref 150–400)
RBC: 4.09 MIL/uL — ABNORMAL LOW (ref 4.22–5.81)
RDW: 14.1 % (ref 11.5–15.5)
WBC Count: 7.2 10*3/uL (ref 4.0–10.5)
nRBC: 0 % (ref 0.0–0.2)

## 2021-06-19 LAB — CMP (CANCER CENTER ONLY)
ALT: 17 U/L (ref 0–44)
AST: 15 U/L (ref 15–41)
Albumin: 4.2 g/dL (ref 3.5–5.0)
Alkaline Phosphatase: 47 U/L (ref 38–126)
Anion gap: 6 (ref 5–15)
BUN: 31 mg/dL — ABNORMAL HIGH (ref 8–23)
CO2: 25 mmol/L (ref 22–32)
Calcium: 9.5 mg/dL (ref 8.9–10.3)
Chloride: 104 mmol/L (ref 98–111)
Creatinine: 1.92 mg/dL — ABNORMAL HIGH (ref 0.61–1.24)
GFR, Estimated: 35 mL/min — ABNORMAL LOW (ref >60)
Glucose, Bld: 196 mg/dL — ABNORMAL HIGH (ref 70–99)
Potassium: 4.5 mmol/L (ref 3.5–5.1)
Sodium: 135 mmol/L (ref 135–145)
Total Bilirubin: 0.6 mg/dL (ref 0.3–1.2)
Total Protein: 8.1 g/dL (ref 6.5–8.1)

## 2021-06-19 NOTE — Progress Notes (Signed)
Dustin [name]   [phone] Fax:([phone]   Clinic Follow up Note   Patient Care Team: Robley Fries, MD as PCP - General (Internal Medicine) Truitt Merle, MD as Consulting Physician (Hematology) Foye Spurling, MD (Inactive) as Consulting Physician (Internal Medicine) Elmarie Shiley, MD as Consulting Physician (Nephrology)  Date of Service:  06/19/2021  CHIEF COMPLAINT: f/u of MGUS  CURRENT THERAPY:  Observation  ASSESSMENT & PLAN:  Dustin [name] is a 80 y.o. male with   1. IgG MGUS, lytic bone lesions  -11/2014 Bone marrow biopsy showed 5-10% of plasma cell. Initial SPEP in 11/2014 showed monoclonal IgG gammopathy, with low M protein level (0.5-0.6g/dl), serum free Kappa light chain is slightly elevated, light chain ratio was normal. 2016 Bone survey was negative for lytic lesions.  -He lost follow up after 2019 until 09/2020.  -bone survey on 10/16/20 showed multiple new lucencies in frontal and parietal regions of calvarium, and a possible 7 mm lucency in mid left clavicle. He is not symptomatic  -He is clinically stable, no new bone pain or fracture. CBC and CMP WNL today except BG 196, Cr 1.92, hgb 12.5. MM Panel is still pending; I will contact them with the results when they are available. -I discussed the role of PET scan and bone marrow biopsy to rule out MM.  My suspicion for multiple myeloma is low given stable lab and clinical presentation.  Due to his advanced age and limited mobility, I will hold on these tests for now. -lab and f/u in 6 months   2. Comorbidities: HTN, DM, CAD, CKD -He has stenosis and mitral valve regurgitation in the past 2 years.  -He will continue follow-up with his primary care physician, Cardiologist, endocrinologist and nephrologist. -he underwent angiography on 10/29/20 and was found to not be a candidate for EECP. -Cr 1.92 today (06/19/21)   3. Bilateral hip arthritis and back pain  -After right hip surgery in 04/2018  he improved but not able to proceed with left hip surgery due to [place] and heart condition.  -He will continue follow-up with his orthopedic surgeon.  -Hs is ambulating with cane.  -I encouraged him to exercise     Plan -We will call him when his multiple myeloma lab returns -lab and f/u in 6 months   No problem-specific Assessment & Plan notes found for this encounter.   INTERVAL HISTORY:  Dustin [name] is here for a follow up of MGUS. He was last seen by me on 10/16/20. He presents to the clinic accompanied by his wife. He reports some back pain but notes it is related to angina.   All other systems were reviewed with the patient and are negative.  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 Prior b/l common iliac stents 2008, Lt EIA PTA 11/2017   Type II diabetes mellitus ([place])     SURGICAL HISTORY: Past Surgical History:  Procedure Laterality Date   ABDOMINAL AORTOGRAM N/A 12/08/2017   Procedure: ABDOMINAL AORTOGRAM;  Surgeon: Nigel Mormon, MD;  Location: [facility];  Service: Cardiovascular;  Laterality: N/A;   CARDIAC  CATHETERIZATION  1997; 09/2017   COLONOSCOPY WITH PROPOFOL N/A 07/23/2017   Procedure: COLONOSCOPY WITH PROPOFOL;  Surgeon: Juanita Craver, MD;  Location: [facility];  Service: Endoscopy;  Laterality: N/A;   CORONARY ANGIOPLASTY WITH STENT PLACEMENT  2008   CORONARY ARTERY BYPASS GRAFT  1992   "CABG X3"   LOWER EXTREMITY ANGIOGRAPHY N/A 12/08/2017   Procedure: LOWER EXTREMITY ANGIOGRAPHY;  Surgeon: Nigel Mormon, MD;  Location: [place] [facility];  Service: Cardiovascular;  Laterality: N/A;   LOWER EXTREMITY ANGIOGRAPHY N/A 12/15/2017   Procedure:  LOWER EXTREMITY ANGIOGRAPHY - Left Leg;  Surgeon: Nigel Mormon, MD;  Location: [place] [facility];  Service: Cardiovascular;  Laterality: N/A;   PERIPHERAL VASCULAR BALLOON ANGIOPLASTY  12/08/2017   Procedure: PERIPHERAL VASCULAR BALLOON ANGIOPLASTY;  Surgeon: Nigel Mormon, MD;  Location: [place] [facility];  Service: Cardiovascular;;  right common iliac   PERIPHERAL VASCULAR INTERVENTION  12/08/2017   Procedure: PERIPHERAL VASCULAR INTERVENTION;  Surgeon: Nigel Mormon, MD;  Location: [place] [facility];  Service: Cardiovascular;;  abdominal aorta stent   RIGHT/LEFT HEART CATH AND CORONARY/GRAFT ANGIOGRAPHY N/A 10/29/2020   Procedure: RIGHT/LEFT HEART CATH AND CORONARY/GRAFT ANGIOGRAPHY;  Surgeon: Nigel Mormon, MD;  Location: [place] [facility];  Service: Cardiovascular;  Laterality: N/A;    I have reviewed the social history and family history with the patient and they are unchanged from previous note.  ALLERGIES:  has No Known Allergies.  MEDICATIONS:  Current Outpatient Medications  Medication Sig Dispense Refill   acetaminophen (TYLENOL) 650 MG CR tablet Take 650 mg by mouth every 8 (eight) hours as needed (for mild pain).     amLODipine (NORVASC) 2.5 MG tablet Take 2.5 mg by mouth daily.     Cholecalciferol (VITAMIN D3) 125 MCG (5000 UT) TABS Take 10,000 Units by mouth daily in the afternoon.     Continuous Blood Gluc Sensor (FREESTYLE LIBRE 2 SENSOR) MISC Inject 1 patch into the skin every 14 (fourteen) days.     furosemide (LASIX) 40 MG tablet Take 1 tablet (40 mg total) by mouth as directed. 2 pills in am, 1 pill in pm 180 tablet 1   Insulin Glargine, 1 Unit Dial, (TOUJEO SOLOSTAR) 300 UNIT/ML SOPN Inject 50 Units into the skin at bedtime. (Patient taking differently: Inject 16 Units into the skin at bedtime.) 4.5 mL 2   insulin lispro (HUMALOG) 100 UNIT/ML KwikPen 6 units +correctional     isosorbide mononitrate (IMDUR) 120 MG 24 hr tablet Take 2 tablets  (240 mg total) by mouth daily. (Patient taking differently: Take 120 mg by mouth in the morning.) 180 tablet 3   Magnesium 250 MG TABS Take 250 mg by mouth in the morning and at bedtime.     metoprolol (TOPROL-XL) 200 MG 24 hr tablet Take 200 mg by mouth in the morning.     Potassium Chloride ER 20 MEQ TBCR TAKE 1 TABLET BY MOUTH TWICE DAILY (Patient taking differently: Take 20 mEq by mouth 2 (two) times daily.) 60 tablet 3   ranolazine (RANEXA) 500 MG 12 hr tablet Take 1 tablet (500 mg total) by mouth 2 (two) times daily. 180 tablet 3   Rivaroxaban (XARELTO) 15 MG TABS tablet Take 1 tablet (15 mg total) by mouth at bedtime. Resume on 10/30/2020 1 tablet 0   rosuvastatin (CRESTOR) 20 MG tablet TAKE 1 TABLET BY MOUTH EVERY DAY (Patient taking differently: Take 20 mg by mouth at bedtime.) 30 tablet 3   tamsulosin (  FLOMAX) 0.4 MG CAPS capsule Take 0.4 mg by mouth in the morning and at bedtime.  3   timolol (TIMOPTIC) 0.5 % ophthalmic solution Place 1 drop into both eyes in the morning.     No current facility-administered medications for this visit.    PHYSICAL EXAMINATION: ECOG PERFORMANCE STATUS: 3 - Symptomatic, >50% confined to bed  Vitals:   06/19/21 1218  BP: 124/63  Pulse: 60  Resp: 17  SpO2: 100%   Wt Readings from Last 3 Encounters:  06/19/21 182 lb 1 oz (82.6 kg)  05/06/21 183 lb (83 kg)  03/20/21 183 lb (83 kg)     GENERAL:alert, no distress and comfortable SKIN: skin color normal, no rashes or significant lesions EYES: normal, Conjunctiva are pink and non-injected, sclera clear  NEURO: alert & oriented x 3 with fluent speech  LABORATORY DATA:  I have reviewed the data as listed CBC Latest Ref Rng & Units 06/19/2021 10/29/2020 10/29/2020  WBC 4.0 - 10.5 K/uL 7.2 - -  Hemoglobin 13.0 - 17.0 g/dL 12.5(L) 11.9(L) 11.6(L)  Hematocrit 39.0 - 52.0 % 37.8(L) 35.0(L) 34.0(L)  Platelets 150 - 400 K/uL 167 - -     CMP Latest Ref Rng & Units 06/19/2021 03/26/2021 10/29/2020  Glucose  70 - 99 mg/dL 196(H) 237(H) -  BUN 8 - 23 mg/dL 31(H) 34(H) -  Creatinine 0.61 - 1.24 mg/dL 1.92(H) 1.98(H) -  Sodium 135 - 145 mmol/L 135 140 142  Potassium 3.5 - 5.1 mmol/L 4.5 4.5 4.0  Chloride 98 - 111 mmol/L 104 99 -  CO2 22 - 32 mmol/L 25 23 -  Calcium 8.9 - 10.3 mg/dL 9.5 9.5 -  Total Protein 6.5 - 8.1 g/dL 8.1 - -  Total Bilirubin 0.3 - 1.2 mg/dL 0.6 - -  Alkaline Phos 38 - 126 U/L 47 - -  AST 15 - 41 U/L 15 - -  ALT 0 - 44 U/L 17 - -      RADIOGRAPHIC STUDIES: I have personally reviewed the radiological images as listed and agreed with the findings in the report. No results found.    No orders of the defined types were placed in this encounter.  All questions were answered. The patient knows to call the clinic with any problems, questions or concerns. No barriers to learning was detected. The total time spent in the appointment was 25 minutes.     Truitt Merle, MD 06/19/2021   I, Wilburn Mylar, am acting as scribe for Truitt Merle, MD.   I have reviewed the above documentation for accuracy and completeness, and I agree with the above.

## 2021-06-20 LAB — KAPPA/LAMBDA LIGHT CHAINS
Kappa free light chain: 53.2 mg/L — ABNORMAL HIGH (ref 3.3–19.4)
Kappa, lambda light chain ratio: 2.5 — ABNORMAL HIGH (ref 0.26–1.65)
Lambda free light chains: 21.3 mg/L (ref 5.7–26.3)

## 2021-06-27 LAB — MULTIPLE MYELOMA PANEL, SERUM
Albumin SerPl Elph-Mcnc: 4 g/dL (ref 2.9–4.4)
Albumin/Glob SerPl: 1.1 (ref 0.7–1.7)
Alpha 1: 0.2 g/dL (ref 0.0–0.4)
Alpha2 Glob SerPl Elph-Mcnc: 0.7 g/dL (ref 0.4–1.0)
B-Globulin SerPl Elph-Mcnc: 1.1 g/dL (ref 0.7–1.3)
Gamma Glob SerPl Elph-Mcnc: 1.9 g/dL — ABNORMAL HIGH (ref 0.4–1.8)
Globulin, Total: 3.9 g/dL (ref 2.2–3.9)
IgA: 313 mg/dL (ref 61–437)
IgG (Immunoglobin G), Serum: 2000 mg/dL — ABNORMAL HIGH (ref 603–1613)
IgM (Immunoglobulin M), Srm: 49 mg/dL (ref 15–143)
M Protein SerPl Elph-Mcnc: 0.8 g/dL — ABNORMAL HIGH
Total Protein ELP: 7.9 g/dL (ref 6.0–8.5)

## 2021-07-02 ENCOUNTER — Telehealth: Payer: Self-pay

## 2021-07-02 NOTE — Telephone Encounter (Signed)
Spoke with Patient and provided with lab result information as requested by Provider. No other concerns voiced at this time.

## 2021-07-02 NOTE — Telephone Encounter (Signed)
-----   Message from Truitt Merle, MD sent at 07/01/2021  8:35 PM EST ----- Please let pt know his lab results, his renal function, M-protein and light chain level were all stable, no new concerns, thanks   Truitt Merle  07/01/2021

## 2021-07-09 NOTE — Telephone Encounter (Signed)
Charted in error.

## 2021-07-16 ENCOUNTER — Other Ambulatory Visit: Payer: Self-pay | Admitting: Cardiology

## 2021-07-16 DIAGNOSIS — I25708 Atherosclerosis of coronary artery bypass graft(s), unspecified, with other forms of angina pectoris: Secondary | ICD-10-CM

## 2021-08-28 ENCOUNTER — Other Ambulatory Visit: Payer: Self-pay | Admitting: Cardiology

## 2021-08-28 DIAGNOSIS — I25708 Atherosclerosis of coronary artery bypass graft(s), unspecified, with other forms of angina pectoris: Secondary | ICD-10-CM

## 2021-08-28 DIAGNOSIS — I5032 Chronic diastolic (congestive) heart failure: Secondary | ICD-10-CM

## 2021-11-08 ENCOUNTER — Telehealth: Payer: Self-pay | Admitting: Cardiology

## 2021-11-08 NOTE — Telephone Encounter (Signed)
May I speak with Mrs. Dustin [name]. If available, please transfer to my office phone. ? ?Thanks ?MJP ? ?

## 2021-11-08 NOTE — Telephone Encounter (Signed)
Caren Griffins (patient's spouse) called to inform you that Vernis has passed away. ?

## 2021-11-28 DEATH — deceased

## 2021-12-18 ENCOUNTER — Other Ambulatory Visit: Payer: Medicare Other

## 2021-12-18 ENCOUNTER — Ambulatory Visit: Payer: Medicare Other | Admitting: Hematology

## 2022-03-31 IMAGING — DX DG BONE SURVEY MET
10 series · 10 of 10 positions shown · non-contrast
Comparison: 12/12/2014

CLINICAL DATA: Fatigue, dyspnea, chronic renal insufficiency, MGUS

EXAM:
METASTATIC BONE SURVEY

[skull lat]
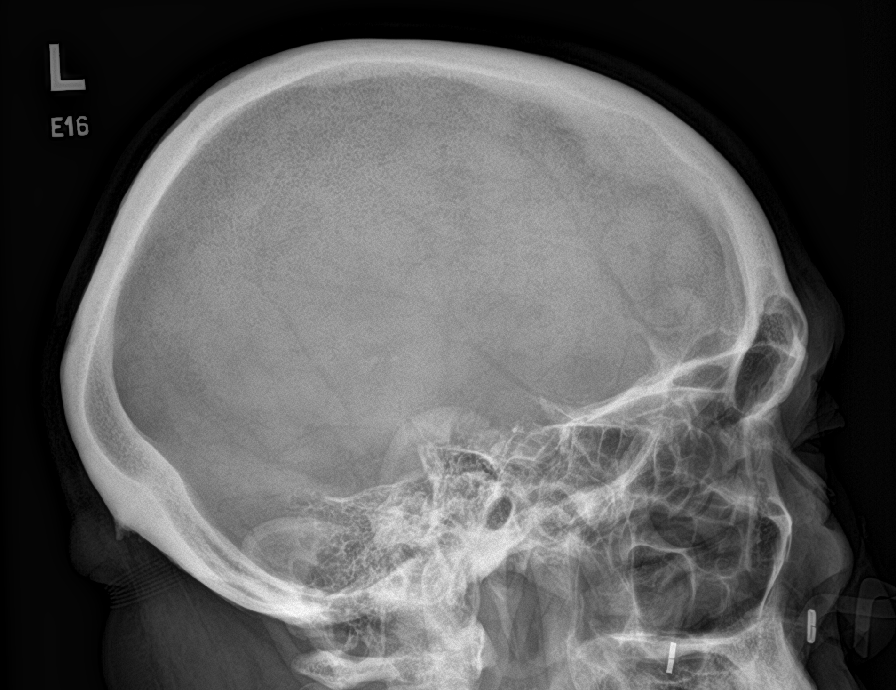

[shoulder ap (1 of 2)]
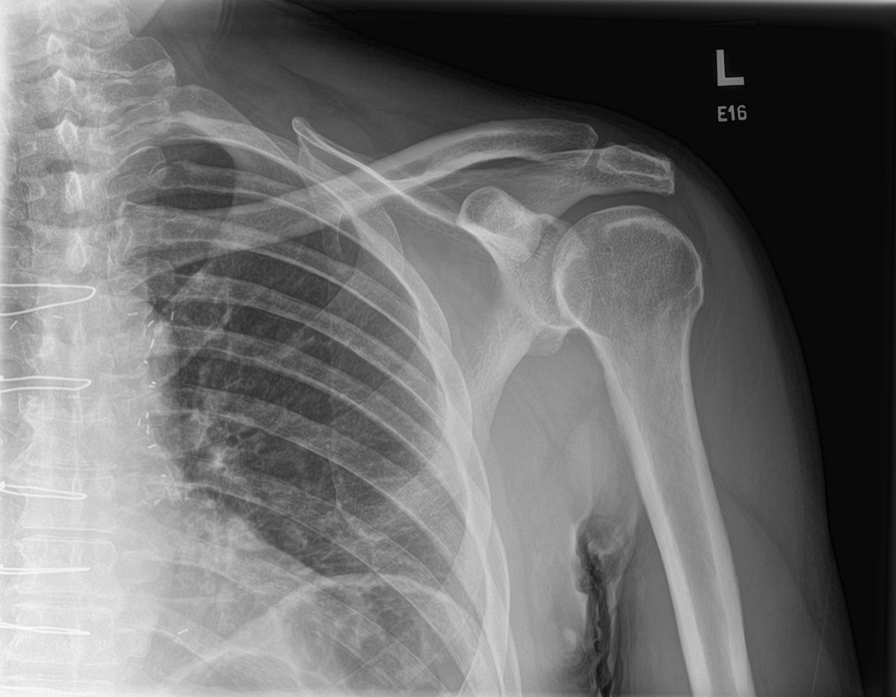

[shoulder ap (2 of 2)]
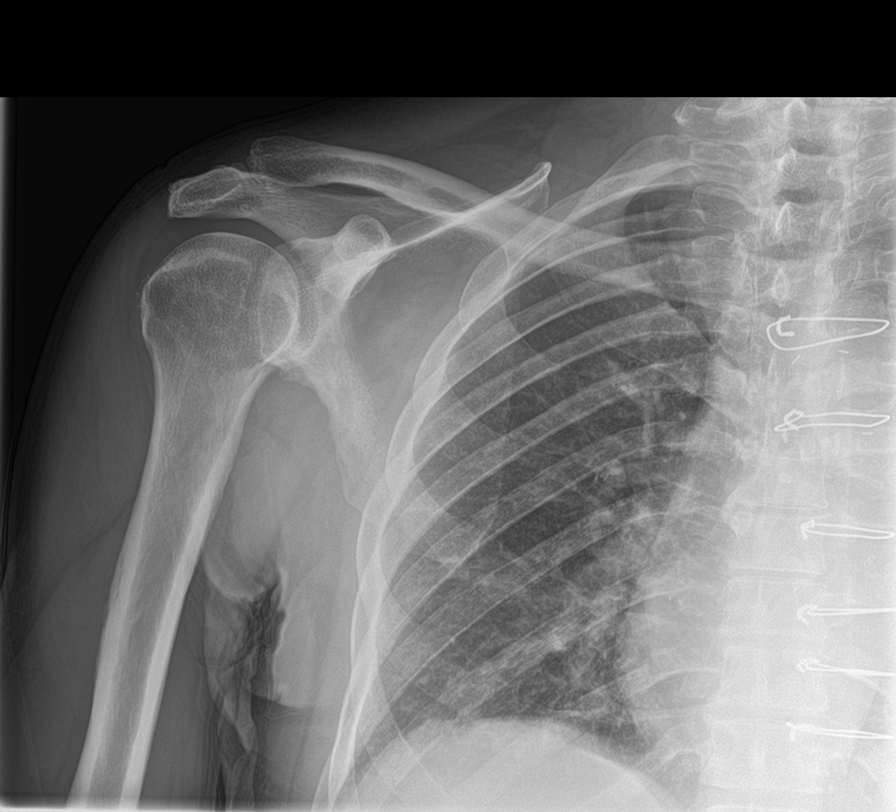

[humerus ap (1 of 2)]
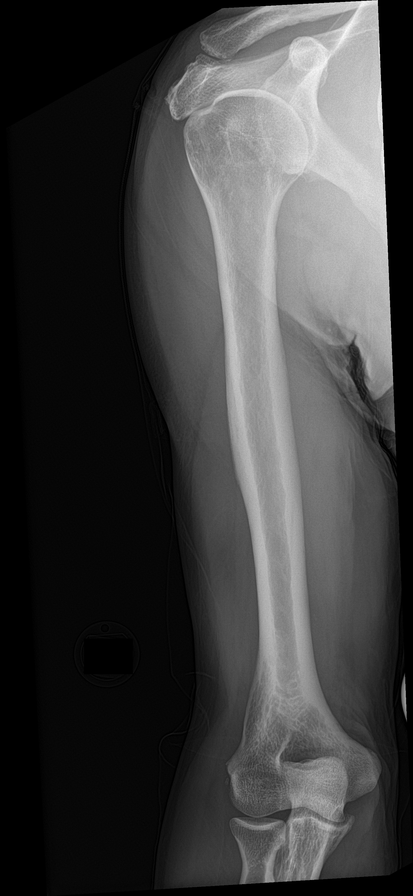

[humerus ap (2 of 2)]
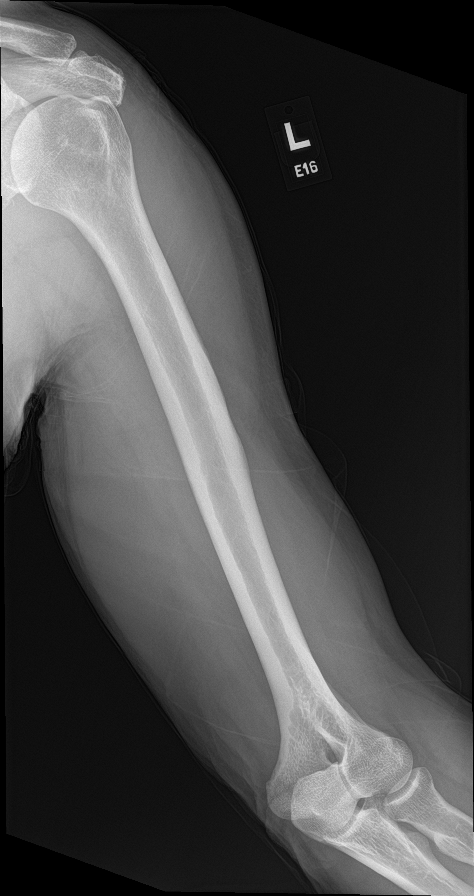

[forearm ap (1 of 2)]
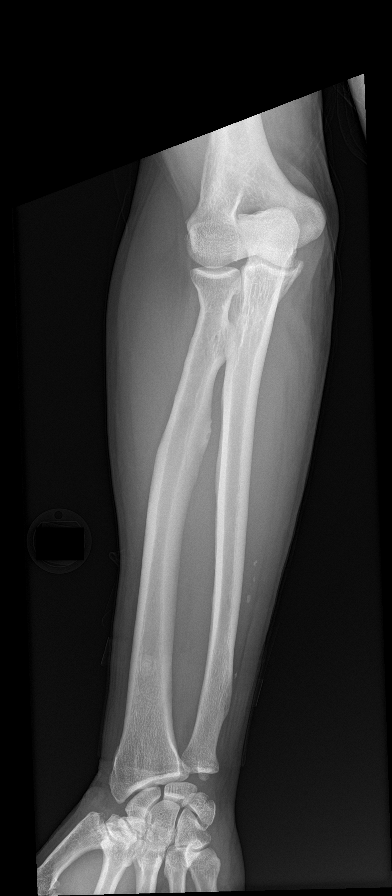

[forearm ap (2 of 2)]
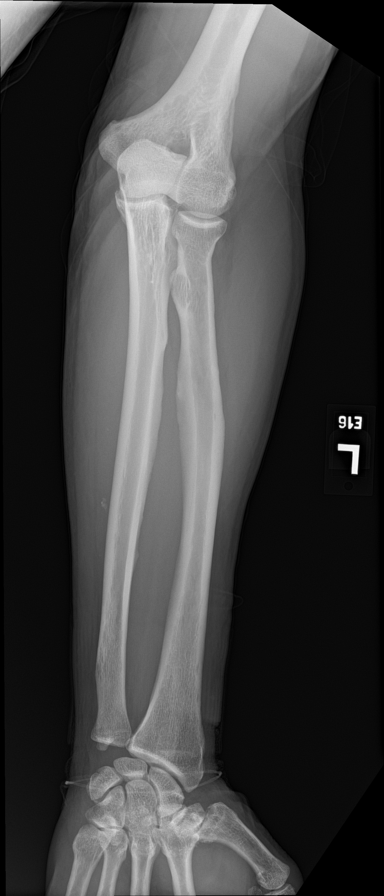

[c-spine ap]
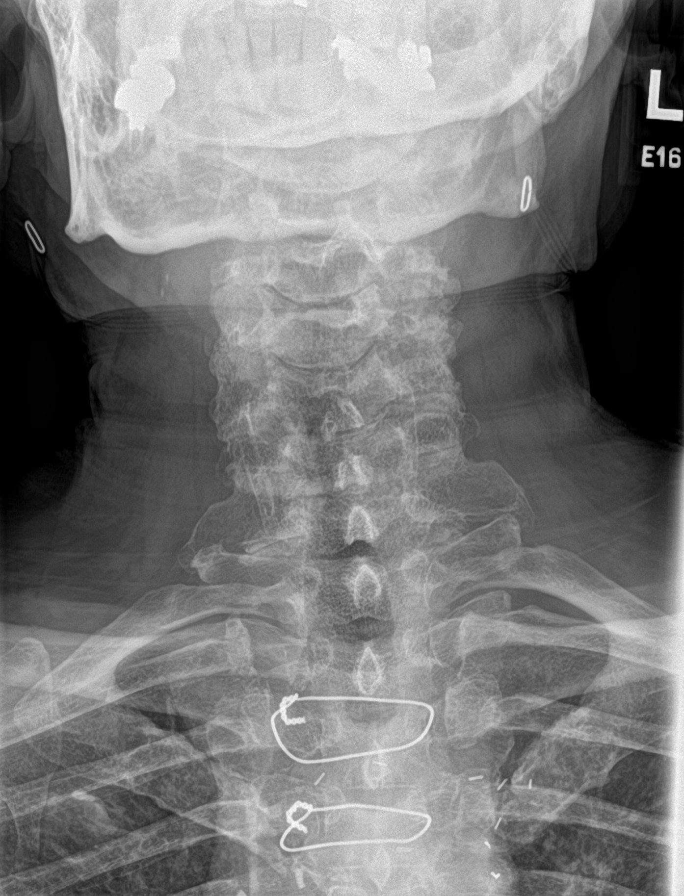

[c-spine lat]
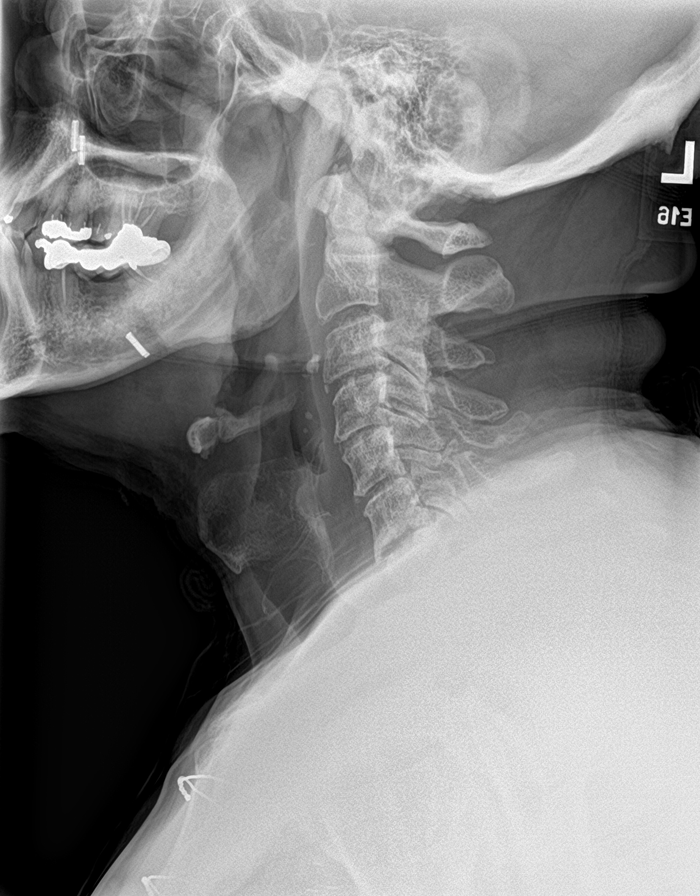

[t-spine ap]
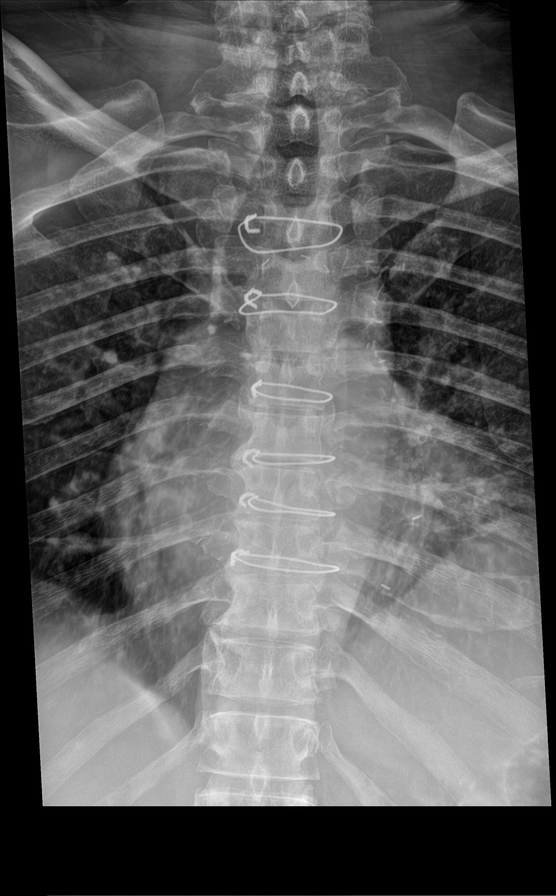

[10 of 10 positions shown; findings below may reference images not displayed]

FINDINGS: Skeletal survey was performed.

Lateral skull: The well-defined lucency within the frontal bone seen
on prior study is less apparent on this exam. However, there are
multiple punctate lucency seen throughout the frontal and parietal
regions of the calvarium.

Upper extremities: Frontal views of the upper extremities are
obtained from the shoulders through the wrists. There are no acute
or destructive bony lesions. Alignment is anatomic.

Spine: Frontal and lateral views of the spine are obtained. There
are no acute or destructive bony lesions. There is moderate
spondylosis of the lower cervical spine from C3 through C7. Mild
diffuse thoracic spondylosis is noted. There is mild to moderate
spondylosis and facet hypertrophy at L4-5 and L5-S1.

Chest: Single frontal view of the chest demonstrates chronic
elevation of the left hemidiaphragm. Postsurgical changes from CABG.
Cardiac silhouette is stable. No acute airspace disease. There may
be a faint 7 mm lucency within the mid left clavicle. No other acute
or destructive bony lesions.

Pelvis: Right hip arthroplasty is unremarkable. There is severe left
hip osteoarthritis with complete loss of joint space and marked
marginal osteophyte formation. There are no acute or destructive
lesions.

Lower extremities: Frontal views of the bilateral lower extremities
are obtained from the hips through the ankles. There are no acute or
destructive bony lesions. Alignment is anatomic.
IMPRESSION: 1. Multiple punctate lucencies within the frontal and parietal
regions of the calvarium, new since prior study. The well-defined
lucency in the frontal region seen previously is obscured today.
2. Possible 7 mm lucency within the mid left clavicle.
3. No other acute or destructive bony lesions.
4. Multilevel spondylosis throughout the entire spine.
5. Severe left hip osteoarthritis.
